# Patient Record
Sex: Male | Born: 1975 | Race: Black or African American | Hispanic: No | Smoking: Never smoker
Health system: Southern US, Community
[De-identification: ages and names within clinical notes are randomized; demographics above are authoritative.]

## PROBLEM LIST (undated history)

## (undated) DIAGNOSIS — K219 Gastro-esophageal reflux disease without esophagitis: Secondary | ICD-10-CM

## (undated) DIAGNOSIS — G4733 Obstructive sleep apnea (adult) (pediatric): Secondary | ICD-10-CM

## (undated) DIAGNOSIS — Z9989 Dependence on other enabling machines and devices: Secondary | ICD-10-CM

## (undated) DIAGNOSIS — M1990 Unspecified osteoarthritis, unspecified site: Secondary | ICD-10-CM

## (undated) DIAGNOSIS — A048 Other specified bacterial intestinal infections: Secondary | ICD-10-CM

## (undated) DIAGNOSIS — G4730 Sleep apnea, unspecified: Secondary | ICD-10-CM

## (undated) DIAGNOSIS — B009 Herpesviral infection, unspecified: Secondary | ICD-10-CM

## (undated) DIAGNOSIS — U071 COVID-19: Secondary | ICD-10-CM

## (undated) HISTORY — DX: Dependence on other enabling machines and devices: Z99.89

## (undated) HISTORY — DX: COVID-19: U07.1

## (undated) HISTORY — DX: Herpesviral infection, unspecified: B00.9

## (undated) HISTORY — DX: Unspecified osteoarthritis, unspecified site: M19.90

## (undated) HISTORY — DX: Other specified bacterial intestinal infections: A04.8

## (undated) HISTORY — DX: Obstructive sleep apnea (adult) (pediatric): G47.33

## (undated) HISTORY — PX: KIDNEY DONATION: SHX685

## (undated) HISTORY — PX: VASECTOMY: SHX75

## (undated) HISTORY — PX: OTHER SURGICAL HISTORY: SHX169

---

## 2017-05-04 ENCOUNTER — Ambulatory Visit: Payer: Self-pay | Admitting: Family Medicine

## 2017-05-12 ENCOUNTER — Telehealth: Payer: Self-pay | Admitting: Family Medicine

## 2017-05-12 NOTE — Telephone Encounter (Signed)
Please advise where to schedule     Copied from CRM #[id number]. Topic: Appointment Scheduling - Scheduling Inquiry for Clinic >> May 05, 2017 11:43 AM Oneal GroutSebastian, Jennifer S wrote: Reason for CRM: New patient appt was scheduled on 05/04/17(Dr Sonnen[place]), cancelled due to weather. Patient needs to get in asap, please advise >> May 05, 2017  2:51 PM Chrismon, Margaretmary Dysshley L wrote: Lm on vm to resched  Pec can sched  >> May 12, 2017 11:31 AM Floria RavelingStovall, Shana A wrote: Pt called in back in to resch new pt appt Best number [phone] .

## 2017-05-19 NOTE — Telephone Encounter (Addendum)
Scheduled patient with Dr. Shirlee LatchMclean if he chooses to have a male as his provider .

## 2017-06-17 ENCOUNTER — Ambulatory Visit: Payer: BC Managed Care – PPO | Admitting: Internal Medicine

## 2017-06-17 ENCOUNTER — Encounter: Payer: Self-pay | Admitting: Internal Medicine

## 2017-06-17 ENCOUNTER — Ambulatory Visit (INDEPENDENT_AMBULATORY_CARE_PROVIDER_SITE_OTHER): Payer: BC Managed Care – PPO

## 2017-06-17 VITALS — BP 130/90 | HR 90 | Temp 98.5°F | Resp 16 | Ht 72.0 in | Wt 280.2 lb

## 2017-06-17 DIAGNOSIS — Z1329 Encounter for screening for other suspected endocrine disorder: Secondary | ICD-10-CM | POA: Diagnosis not present

## 2017-06-17 DIAGNOSIS — R42 Dizziness and giddiness: Secondary | ICD-10-CM

## 2017-06-17 DIAGNOSIS — Z23 Encounter for immunization: Secondary | ICD-10-CM

## 2017-06-17 DIAGNOSIS — R05 Cough: Secondary | ICD-10-CM

## 2017-06-17 DIAGNOSIS — Z0000 Encounter for general adult medical examination without abnormal findings: Secondary | ICD-10-CM

## 2017-06-17 DIAGNOSIS — Z13220 Encounter for screening for lipoid disorders: Secondary | ICD-10-CM | POA: Diagnosis not present

## 2017-06-17 DIAGNOSIS — G4733 Obstructive sleep apnea (adult) (pediatric): Secondary | ICD-10-CM | POA: Diagnosis not present

## 2017-06-17 DIAGNOSIS — G8929 Other chronic pain: Secondary | ICD-10-CM

## 2017-06-17 DIAGNOSIS — R0681 Apnea, not elsewhere classified: Secondary | ICD-10-CM | POA: Diagnosis not present

## 2017-06-17 DIAGNOSIS — K439 Ventral hernia without obstruction or gangrene: Secondary | ICD-10-CM | POA: Diagnosis not present

## 2017-06-17 DIAGNOSIS — M25561 Pain in right knee: Secondary | ICD-10-CM

## 2017-06-17 DIAGNOSIS — R059 Cough, unspecified: Secondary | ICD-10-CM

## 2017-06-17 DIAGNOSIS — K219 Gastro-esophageal reflux disease without esophagitis: Secondary | ICD-10-CM | POA: Diagnosis not present

## 2017-06-17 LAB — CBC WITH DIFFERENTIAL/PLATELET
Basophils Absolute: 0 10*3/uL (ref 0.0–0.1)
Basophils Relative: 0.8 % (ref 0.0–3.0)
EOS PCT: 7.7 % — AB (ref 0.0–5.0)
Eosinophils Absolute: 0.5 10*3/uL (ref 0.0–0.7)
HEMATOCRIT: 46.7 % (ref 39.0–52.0)
HEMOGLOBIN: 15.1 g/dL (ref 13.0–17.0)
LYMPHS PCT: 39.1 % (ref 12.0–46.0)
Lymphs Abs: 2.3 10*3/uL (ref 0.7–4.0)
MCHC: 32.2 g/dL (ref 30.0–36.0)
MCV: 84.2 fl (ref 78.0–100.0)
MONOS PCT: 11.8 % (ref 3.0–12.0)
Monocytes Absolute: 0.7 10*3/uL (ref 0.1–1.0)
Neutro Abs: 2.4 10*3/uL (ref 1.4–7.7)
Neutrophils Relative %: 40.6 % — ABNORMAL LOW (ref 43.0–77.0)
Platelets: 221 10*3/uL (ref 150.0–400.0)
RBC: 5.55 Mil/uL (ref 4.22–5.81)
RDW: 14.8 % (ref 11.5–15.5)
WBC: 5.9 10*3/uL (ref 4.0–10.5)

## 2017-06-17 LAB — COMPREHENSIVE METABOLIC PANEL
ALBUMIN: 4.2 g/dL (ref 3.5–5.2)
ALK PHOS: 74 U/L (ref 39–117)
ALT: 35 U/L (ref 0–53)
AST: 25 U/L (ref 0–37)
BUN: 16 mg/dL (ref 6–23)
CALCIUM: 9.6 mg/dL (ref 8.4–10.5)
CHLORIDE: 102 meq/L (ref 96–112)
CO2: 31 mEq/L (ref 19–32)
Creatinine, Ser: 1.31 mg/dL (ref 0.40–1.50)
GFR: 63.4 mL/min (ref >60.00)
Glucose, Bld: 95 mg/dL (ref 70–99)
POTASSIUM: 4.3 meq/L (ref 3.5–5.1)
SODIUM: 139 meq/L (ref 135–145)
Total Bilirubin: 0.6 mg/dL (ref 0.2–1.2)
Total Protein: 7.5 g/dL (ref 6.0–8.3)

## 2017-06-17 LAB — URINALYSIS, ROUTINE W REFLEX MICROSCOPIC
BILIRUBIN URINE: NEGATIVE
HGB URINE DIPSTICK: NEGATIVE
Ketones, ur: NEGATIVE
LEUKOCYTES UA: NEGATIVE
Nitrite: NEGATIVE
RBC / HPF: NONE SEEN (ref 0–?)
Specific Gravity, Urine: 1.015 (ref 1.000–1.030)
TOTAL PROTEIN, URINE-UPE24: NEGATIVE
URINE GLUCOSE: NEGATIVE
UROBILINOGEN UA: 0.2 (ref 0.0–1.0)
WBC, UA: NONE SEEN (ref 0–?)
pH: 7.5 (ref 5.0–8.0)

## 2017-06-17 LAB — T4, FREE: Free T4: 0.99 ng/dL (ref 0.60–1.60)

## 2017-06-17 LAB — TSH: TSH: 2.16 u[IU]/mL (ref 0.35–4.50)

## 2017-06-17 LAB — LIPID PANEL
CHOLESTEROL: 210 mg/dL — AB (ref 0–200)
HDL: 35.9 mg/dL — ABNORMAL LOW (ref >39.00)
LDL Cholesterol: 148 mg/dL — ABNORMAL HIGH (ref 0–99)
NONHDL: 174.49
Total CHOL/HDL Ratio: 6
Triglycerides: 132 mg/dL (ref 0.0–149.0)
VLDL: 26.4 mg/dL (ref 0.0–40.0)

## 2017-06-17 MED ORDER — PANTOPRAZOLE SODIUM 40 MG PO TBEC: 40.0000 mg | DELAYED_RELEASE_TABLET | Freq: Every evening | ORAL | 1 refills | Status: DC

## 2017-06-17 NOTE — Patient Instructions (Addendum)
rec Tdap vaccine if it has been more than 10 years for whooping cough and dirty cut/animal bite protection  rec hepatitis B vaccines 0 months, 2 months, 6 months  You can get both here in the future  Try Protonix for GERD before dinner 30 minutes    Sleep Apnea Sleep apnea is a condition in which breathing pauses or becomes shallow during sleep. Episodes of sleep apnea usually last 10 seconds or longer, and they may occur as many as 20 times an hour. Sleep apnea disrupts your sleep and keeps your body from getting the rest that it needs. This condition can increase your risk of certain health problems, including:  Heart attack.  Stroke.  Obesity.  Diabetes.  Heart failure.  Irregular heartbeat.  There are three kinds of sleep apnea:  Obstructive sleep apnea. This kind is caused by a blocked or collapsed airway.  Central sleep apnea. This kind happens when the part of the brain that controls breathing does not send the correct signals to the muscles that control breathing.  Mixed sleep apnea. This is a combination of obstructive and central sleep apnea.  What are the causes? The most common cause of this condition is a collapsed or blocked airway. An airway can collapse or become blocked if:  Your throat muscles are abnormally relaxed.  Your tongue and tonsils are larger than normal.  You are overweight.  Your airway is smaller than normal.  What increases the risk? This condition is more likely to develop in people who:  Are overweight.  Smoke.  Have a smaller than normal airway.  Are elderly.  Are male.  Drink alcohol.  Take sedatives or tranquilizers.  Have a family history of sleep apnea.  What are the signs or symptoms? Symptoms of this condition include:  Trouble staying asleep.  Daytime sleepiness and tiredness.  Irritability.  Loud snoring.  Morning headaches.  Trouble concentrating.  Forgetfulness.  Decreased interest in  sex.  Unexplained sleepiness.  Mood swings.  Personality changes.  Feelings of depression.  Waking up often during the night to urinate.  Dry mouth.  Sore throat.  How is this diagnosed? This condition may be diagnosed with:  A medical history.  A physical exam.  A series of tests that are done while you are sleeping (sleep study). These tests are usually done in a sleep lab, but they may also be done at home.  How is this treated? Treatment for this condition aims to restore normal breathing and to ease symptoms during sleep. It may involve managing health issues that can affect breathing, such as high blood pressure or obesity. Treatment may include:  Sleeping on your side.  Using a decongestant if you have nasal congestion.  Avoiding the use of depressants, including alcohol, sedatives, and narcotics.  Losing weight if you are overweight.  Making changes to your diet.  Quitting smoking.  Using a device to open your airway while you sleep, such as: ? An oral appliance. This is a custom-made mouthpiece that shifts your lower jaw forward. ? A continuous positive airway pressure (CPAP) device. This device delivers oxygen to your airway through a mask. ? A nasal expiratory positive airway pressure (EPAP) device. This device has valves that you put into each nostril. ? A bi-level positive airway pressure (BPAP) device. This device delivers oxygen to your airway through a mask.  Surgery if other treatments do not work. During surgery, excess tissue is removed to create a wider airway.  It is  important to get treatment for sleep apnea. Without treatment, this condition can lead to:  High blood pressure.  Coronary artery disease.  (Men) An inability to achieve or maintain an erection (impotence).  Reduced thinking abilities.  Follow these instructions at home:  Make any lifestyle changes that your health care provider recommends.  Eat a healthy, well-balanced  diet.  Take over-the-counter and prescription medicines only as told by your health care provider.  Avoid using depressants, including alcohol, sedatives, and narcotics.  Take steps to lose weight if you are overweight.  If you were given a device to open your airway while you sleep, use it only as told by your health care provider.  Do not use any tobacco products, such as cigarettes, chewing tobacco, and e-cigarettes. If you need help quitting, ask your health care provider.  Keep all follow-up visits as told by your health care provider. This is important. Contact a health care provider if:  The device that you received to open your airway during sleep is uncomfortable or does not seem to be working.  Your symptoms do not improve.  Your symptoms get worse. Get help right away if:  You develop chest pain.  You develop shortness of breath.  You develop discomfort in your back, arms, or stomach.  You have trouble speaking.  You have weakness on one side of your body.  You have drooping in your face. These symptoms may represent a serious problem that is an emergency. Do not wait to see if the symptoms will go away. Get medical help right away. Call your local emergency services (911 in the [place] Do not drive yourself to the hospital. This information is not intended to replace advice given to you by your health care provider. Make sure you discuss any questions you have with your health care provider. Document Released: 05/01/2002 Document Revised: 01/05/2016 Document Reviewed: 02/18/2015 Elsevier Interactive Patient Education  2018 [employer]  Gastroesophageal Reflux Disease, Adult Normally, food travels down the esophagus and stays in the stomach to be digested. However, when a person has gastroesophageal reflux disease (GERD), food and stomach acid move back up into the esophagus. When this happens, the esophagus becomes sore and inflamed. Over time, GERD can create  small holes (ulcers) in the lining of the esophagus. What are the causes? This condition is caused by a problem with the muscle between the esophagus and the stomach (lower esophageal sphincter, or LES). Normally, the LES muscle closes after food passes through the esophagus to the stomach. When the LES is weakened or abnormal, it does not close properly, and that allows food and stomach acid to go back up into the esophagus. The LES can be weakened by certain dietary substances, medicines, and medical conditions, including:  Tobacco use.  Pregnancy.  Having a hiatal hernia.  Heavy alcohol use.  Certain foods and beverages, such as coffee, chocolate, onions, and peppermint.  What increases the risk? This condition is more likely to develop in:  People who have an increased body weight.  People who have connective tissue disorders.  People who use NSAID medicines.  What are the signs or symptoms? Symptoms of this condition include:  Heartburn.  Difficult or painful swallowing.  The feeling of having a lump in the throat.  Abitter taste in the mouth.  Bad breath.  Having a large amount of saliva.  Having an upset or bloated stomach.  Belching.  Chest pain.  Shortness of breath or wheezing.  Ongoing (chronic) cough  or a night-time cough.  Wearing away of tooth enamel.  Weight loss.  Different conditions can cause chest pain. Make sure to see your health care provider if you experience chest pain. How is this diagnosed? Your health care provider will take a medical history and perform a physical exam. To determine if you have mild or severe GERD, your health care provider may also monitor how you respond to treatment. You may also have other tests, including:  An endoscopy toexamine your stomach and esophagus with a small camera.  A test thatmeasures the acidity level in your esophagus.  A test thatmeasures how much pressure is on your esophagus.  A  barium swallow or modified barium swallow to show the shape, size, and functioning of your esophagus.  How is this treated? The goal of treatment is to help relieve your symptoms and to prevent complications. Treatment for this condition may vary depending on how severe your symptoms are. Your health care provider may recommend:  Changes to your diet.  Medicine.  Surgery.  Follow these instructions at home: Diet  Follow a diet as recommended by your health care provider. This may involve avoiding foods and drinks such as: ? Coffee and tea (with or without caffeine). ? Drinks that containalcohol. ? Energy drinks and sports drinks. ? Carbonated drinks or sodas. ? Chocolate and cocoa. ? Peppermint and mint flavorings. ? Garlic and onions. ? Horseradish. ? Spicy and acidic foods, including peppers, chili powder, curry powder, vinegar, hot sauces, and barbecue sauce. ? Citrus fruit juices and citrus fruits, such as oranges, lemons, and limes. ? Tomato-based foods, such as red sauce, chili, salsa, and pizza with red sauce. ? Fried and fatty foods, such as donuts, french fries, potato chips, and high-fat dressings. ? High-fat meats, such as hot dogs and fatty cuts of red and white meats, such as rib eye steak, sausage, ham, and bacon. ? High-fat dairy items, such as whole milk, butter, and cream cheese.  Eat small, frequent meals instead of large meals.  Avoid drinking large amounts of liquid with your meals.  Avoid eating meals during the 2-3 hours before bedtime.  Avoid lying down right after you eat.  Do not exercise right after you eat. General instructions  Pay attention to any changes in your symptoms.  Take over-the-counter and prescription medicines only as told by your health care provider. Do not take aspirin, ibuprofen, or other NSAIDs unless your health care provider told you to do so.  Do not use any tobacco products, including cigarettes, chewing tobacco, and  e-cigarettes. If you need help quitting, ask your health care provider.  Wear loose-fitting clothing. Do not wear anything tight around your waist that causes pressure on your abdomen.  Raise (elevate) the head of your bed 6 inches (15cm).  Try to reduce your stress, such as with yoga or meditation. If you need help reducing stress, ask your health care provider.  If you are overweight, reduce your weight to an amount that is healthy for you. Ask your health care provider for guidance about a safe weight loss goal.  Keep all follow-up visits as told by your health care provider. This is important. Contact a health care provider if:  You have new symptoms.  You have unexplained weight loss.  You have difficulty swallowing, or it hurts to swallow.  You have wheezing or a persistent cough.  Your symptoms do not improve with treatment.  You have a hoarse voice. Get help right away if:  You have pain in your arms, neck, jaw, teeth, or back.  You feel sweaty, dizzy, or light-headed.  You have chest pain or shortness of breath.  You vomit and your vomit looks like blood or coffee grounds.  You faint.  Your stool is bloody or black.  You cannot swallow, drink, or eat. This information is not intended to replace advice given to you by your health care provider. Make sure you discuss any questions you have with your health care provider. Document Released: 02/18/2005 Document Revised: 10/09/2015 Document Reviewed: 09/05/2014 Elsevier Interactive Patient Education  2018 [employer].   Food Choices for Gastroesophageal Reflux Disease, Adult When you have gastroesophageal reflux disease (GERD), the foods you eat and your eating habits are very important. Choosing the right foods can help ease the discomfort of GERD. Consider working with a diet and nutrition specialist (dietitian) to help you make healthy food choices. What general guidelines should I follow? Eating plan  Choose  healthy foods low in fat, such as fruits, vegetables, whole grains, low-fat dairy products, and lean meat, fish, and poultry.  Eat frequent, small meals instead of three large meals each day. Eat your meals slowly, in a relaxed setting. Avoid bending over or lying down until 2-3 hours after eating.  Limit high-fat foods such as fatty meats or fried foods.  Limit your intake of oils, butter, and shortening to less than 8 teaspoons each day.  Avoid the following: ? Foods that cause symptoms. These may be different for different people. Keep a food diary to keep track of foods that cause symptoms. ? Alcohol. ? Drinking large amounts of liquid with meals. ? Eating meals during the 2-3 hours before bed.  Cook foods using methods other than frying. This may include baking, grilling, or broiling. Lifestyle   Maintain a healthy weight. Ask your health care provider what weight is healthy for you. If you need to lose weight, work with your health care provider to do so safely.  Exercise for at least 30 minutes on 5 or more days each week, or as told by your health care provider.  Avoid wearing clothes that fit tightly around your waist and chest.  Do not use any products that contain nicotine or tobacco, such as cigarettes and e-cigarettes. If you need help quitting, ask your health care provider.  Sleep with the head of your bed raised. Use a wedge under the mattress or blocks under the bed frame to raise the head of the bed. What foods are not recommended? The items listed may not be a complete list. Talk with your dietitian about what dietary choices are best for you. Grains Pastries or quick breads with added fat. [place] toast. Vegetables Deep fried vegetables. [place] fries. Any vegetables prepared with added fat. Any vegetables that cause symptoms. For some people this may include tomatoes and tomato products, chili peppers, onions and garlic, and horseradish. Fruits Any fruits prepared  with added fat. Any fruits that cause symptoms. For some people this may include citrus fruits, such as oranges, grapefruit, pineapple, and lemons. Meats and other protein foods High-fat meats, such as fatty beef or pork, hot dogs, ribs, ham, sausage, salami and bacon. Fried meat or protein, including fried fish and fried chicken. Nuts and nut butters. Dairy Whole milk and chocolate milk. Sour cream. Cream. Ice cream. Cream cheese. Milk shakes. Beverages Coffee and tea, with or without caffeine. Carbonated beverages. Sodas. Energy drinks. Fruit juice made with acidic fruits (such as orange or  grapefruit). Tomato juice. Alcoholic drinks. Fats and oils Butter. Margarine. Shortening. Ghee. Sweets and desserts Chocolate and cocoa. Donuts. Seasoning and other foods Pepper. Peppermint and spearmint. Any condiments, herbs, or seasonings that cause symptoms. For some people, this may include curry, hot sauce, or vinegar-based salad dressings. Summary  When you have gastroesophageal reflux disease (GERD), food and lifestyle choices are very important to help ease the discomfort of GERD.  Eat frequent, small meals instead of three large meals each day. Eat your meals slowly, in a relaxed setting. Avoid bending over or lying down until 2-3 hours after eating.  Limit high-fat foods such as fatty meat or fried foods. This information is not intended to replace advice given to you by your health care provider. Make sure you discuss any questions you have with your health care provider. Document Released: 05/11/2005 Document Revised: 05/12/2016 Document Reviewed: 05/12/2016 Elsevier Interactive Patient Education  [employer]2018 [employer]

## 2017-06-18 ENCOUNTER — Encounter: Payer: Self-pay | Admitting: *Deleted

## 2017-06-20 ENCOUNTER — Other Ambulatory Visit: Payer: Self-pay | Admitting: Internal Medicine

## 2017-06-20 ENCOUNTER — Encounter: Payer: Self-pay | Admitting: Internal Medicine

## 2017-06-20 DIAGNOSIS — R059 Cough, unspecified: Secondary | ICD-10-CM | POA: Insufficient documentation

## 2017-06-20 DIAGNOSIS — A048 Other specified bacterial intestinal infections: Secondary | ICD-10-CM

## 2017-06-20 DIAGNOSIS — K429 Umbilical hernia without obstruction or gangrene: Secondary | ICD-10-CM | POA: Insufficient documentation

## 2017-06-20 DIAGNOSIS — R42 Dizziness and giddiness: Secondary | ICD-10-CM | POA: Insufficient documentation

## 2017-06-20 DIAGNOSIS — E785 Hyperlipidemia, unspecified: Secondary | ICD-10-CM | POA: Insufficient documentation

## 2017-06-20 DIAGNOSIS — E669 Obesity, unspecified: Secondary | ICD-10-CM | POA: Insufficient documentation

## 2017-06-20 DIAGNOSIS — G8929 Other chronic pain: Secondary | ICD-10-CM | POA: Insufficient documentation

## 2017-06-20 DIAGNOSIS — R05 Cough: Secondary | ICD-10-CM | POA: Insufficient documentation

## 2017-06-20 DIAGNOSIS — M25561 Pain in right knee: Secondary | ICD-10-CM

## 2017-06-20 MED ORDER — AMOXICILLIN 500 MG PO CAPS: 1000.0000 mg | ORAL_CAPSULE | Freq: Two times a day (BID) | ORAL | 0 refills | Status: DC

## 2017-06-20 MED ORDER — CLARITHROMYCIN 500 MG PO TABS: 500.0000 mg | ORAL_TABLET | Freq: Two times a day (BID) | ORAL | 0 refills | Status: DC

## 2017-06-20 NOTE — Progress Notes (Signed)
Chief Complaint  Patient presents with  . Establish Care   Establish care 1. 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 He is unsure if he has allergies, +post nasal drip, denies choking on liquids/foods. He does also report uncontrolled GERD sxs worse at night  2. He does report dizziness at times with kneeling to standing positions and he will try to get up slowly to help he does report water intake id low  3. He c/o bulge in abdomen w/o pain and has been told before it was a hernia 4. He does note his wife complaints of him snoring at night but he is unsure if he stops breathing  5. He reports chronic right knee pain s/p meniscal tear repair right knee Dr. Rosita Kea    Review of Systems  Constitutional: Negative for weight loss.  HENT: Negative for hearing loss.   Eyes:       No vision changes   Respiratory: Negative for shortness of breath.   Cardiovascular: Negative for chest pain.  Gastrointestinal: Positive for heartburn.  Musculoskeletal: Positive for joint pain.  Skin: Negative for rash.  Neurological: Positive for dizziness. Negative for headaches.  Psychiatric/Behavioral: Negative for memory loss.   Past Medical History:  Diagnosis Date  . Arthritis    knee s/p right knee surgery meniscal tear with bone fragments, ankle (ortho Dr. Rosita Kea)   Past Surgical History:  Procedure Laterality Date  . KIDNEY DONATION     2004  . meniscal tear     right knee Dr. Rosita Kea 2014    Family History  Problem Relation Age of Onset  . Drug abuse Mother   . Hyperlipidemia Mother   . Hypertension Mother   . Cancer Maternal Aunt        brain, breast, lung ? primary was smoker    Social History   Socioeconomic History  . Marital status: Married    Spouse name: Not on file  . Number of children: Not on file  . Years of education: Not on file  . Highest education level: Not  on file  Social Needs  . Financial resource strain: Not on file  . Food insecurity - worry: Not on file  . Food insecurity - inability: Not on file  . Transportation needs - medical: Not on file  . Transportation needs - non-medical: Not on file  Occupational History  . Not on file  Tobacco Use  . Smoking status: Never Smoker  . Smokeless tobacco: Never Used  . Tobacco comment: light in the past social smoker quit  Substance and Sexual Activity  . Alcohol use: Yes  . Drug use: No  . Sexual activity: Yes  Other Topics Concern  . Not on file  Social History Narrative   2 year college    Works [employer], coaches girls basketball, football, baseball at [employer]   No outpatient medications have been marked as taking for the 06/17/17 encounter (Office Visit) with McLean-Scocuzza, Pasty Spillers, MD.   No Known Allergies Recent Results (from the past 2160 hour(s))  Comprehensive metabolic panel     Status: None   Collection Time: 06/17/17 10:43 AM  Result Value Ref Range   Sodium 139 135 - 145 mEq/L   Potassium 4.3 3.5 - 5.1 mEq/L   Chloride 102 96 - 112 mEq/L   CO2 31 19 -  32 mEq/L   Glucose, Bld 95 70 - 99 mg/dL   BUN 16 6 - 23 mg/dL   Creatinine, Ser 4.09 0.40 - 1.50 mg/dL   Total Bilirubin 0.6 0.2 - 1.2 mg/dL   Alkaline Phosphatase 74 39 - 117 U/L   AST 25 0 - 37 U/L   ALT 35 0 - 53 U/L   Total Protein 7.5 6.0 - 8.3 g/dL   Albumin 4.2 3.5 - 5.2 g/dL   Calcium 9.6 8.4 - 81.1 mg/dL   GFR 91.47 >82.95 mL/min  CBC with Differential/Platelet     Status: Abnormal   Collection Time: 06/17/17 10:43 AM  Result Value Ref Range   WBC 5.9 4.0 - 10.5 K/uL   RBC 5.55 4.22 - 5.81 Mil/uL   Hemoglobin 15.1 13.0 - 17.0 g/dL   HCT 62.1 30.8 - 65.7 %   MCV 84.2 78.0 - 100.0 fl   MCHC 32.2 30.0 - 36.0 g/dL   RDW 84.6 96.2 - 95.2 %   Platelets 221.0 150.0 - 400.0 K/uL   Neutrophils Relative % 40.6 (L) 43.0 - 77.0 %   Lymphocytes Relative 39.1 12.0 - 46.0 %    Monocytes Relative 11.8 3.0 - 12.0 %   Eosinophils Relative 7.7 (H) 0.0 - 5.0 %   Basophils Relative 0.8 0.0 - 3.0 %   Neutro Abs 2.4 1.4 - 7.7 K/uL   Lymphs Abs 2.3 0.7 - 4.0 K/uL   Monocytes Absolute 0.7 0.1 - 1.0 K/uL   Eosinophils Absolute 0.5 0.0 - 0.7 K/uL   Basophils Absolute 0.0 0.0 - 0.1 K/uL  T4, free     Status: None   Collection Time: 06/17/17 10:43 AM  Result Value Ref Range   Free T4 0.99 0.60 - 1.60 ng/dL    Comment: Specimens from patients who are undergoing biotin therapy and /or ingesting biotin supplements may contain high levels of biotin.  The higher biotin concentration in these specimens interferes with this Free T4 assay.  Specimens that contain high levels  of biotin may cause false high results for this Free T4 assay.  Please interpret results in light of the total clinical presentation of the patient.    TSH     Status: None   Collection Time: 06/17/17 10:43 AM  Result Value Ref Range   TSH 2.16 0.35 - 4.50 uIU/mL  Urinalysis, Routine w reflex microscopic     Status: None   Collection Time: 06/17/17 10:43 AM  Result Value Ref Range   Color, Urine YELLOW Yellow;Lt. Yellow   APPearance CLEAR Clear   Specific Gravity, Urine 1.015 1.000 - 1.030   pH 7.5 5.0 - 8.0   Total Protein, Urine NEGATIVE Negative   Urine Glucose NEGATIVE Negative   Ketones, ur NEGATIVE Negative   Bilirubin Urine NEGATIVE Negative   Hgb urine dipstick NEGATIVE Negative   Urobilinogen, UA 0.2 0.0 - 1.0   Leukocytes, UA NEGATIVE Negative   Nitrite NEGATIVE Negative   WBC, UA none seen 0-2/hpf   RBC / HPF none seen 0-2/hpf  Lipid panel     Status: Abnormal   Collection Time: 06/17/17 10:43 AM  Result Value Ref Range   Cholesterol 210 (H) 0 - 200 mg/dL    Comment: ATP III Classification       Desirable:  < 200 mg/dL               Borderline High:  200 - 239 mg/dL          High:  > =  240 mg/dL   Triglycerides 621.3132.0 0.0 - 149.0 mg/dL    Comment: Normal:  <086<150 mg/dLBorderline High:  150  - 199 mg/dL   HDL 57.8435.90 (L) >69.62>39.00 mg/dL   VLDL 95.226.4 0.0 - 84.140.0 mg/dL   LDL Cholesterol 324148 (H) 0 - 99 mg/dL   Total CHOL/HDL Ratio 6     Comment:                Men          Women1/2 Average Risk     3.4          3.3Average Risk          5.0          4.42X Average Risk          9.6          7.13X Average Risk          15.0          11.0                       NonHDL 174.49     Comment: NOTE:  Non-HDL goal should be 30 mg/dL higher than patient's LDL goal (i.e. LDL goal of < 70 mg/dL, would have non-HDL goal of < 100 mg/dL)  H Pylori, IGM, IGG, IGA AB     Status: Abnormal (Preliminary result)   Collection Time: 06/17/17 10:43 AM  Result Value Ref Range   H. pylori, IgG AbS WILL FOLLOW    H. pylori, IgA Abs 14.4 (H) 0.0 - 8.9 units    Comment:                                 Negative          <9.0                                 Equivocal   9.0 - 11.0                                 Positive         >11.0    H pylori, IgM Abs 11.8 (H) 0.0 - 8.9 units    Comment:                                 Negative          <9.0                                 Equivocal   9.0 - 11.0                                 Positive         >11.0 This test was developed and its performance characteristics determined by [facility]. It has not been cleared or approved by the Food and Drug Administration.    Objective  Body mass index is 38.01 kg/m. Wt Readings from Last 3 Encounters:  06/17/17 280 lb 4 oz (127.1 kg)   Temp Readings from Last 3 Encounters:  06/17/17  98.5 F (36.9 C) (Oral)   BP Readings from Last 3 Encounters:  06/17/17 130/90   Pulse Readings from Last 3 Encounters:  06/17/17 90   O2 sat room air 96%  Physical Exam  Constitutional: He is oriented to person, place, and time and well-developed, well-nourished, and in no distress. Vital signs are normal.  HENT:  Head: Normocephalic and atraumatic.  Eyes: Conjunctivae are normal. Pupils are equal, round, and reactive to light.    Cardiovascular: Normal rate, regular rhythm and normal heart sounds.  Pulmonary/Chest: Effort normal and breath sounds normal.  Abdominal: Soft. Bowel sounds are normal. There is no tenderness.    Neurological: He is alert and oriented to person, place, and time. Gait normal. Gait normal.  Skin: Skin is warm and dry.  Psychiatric: Mood, memory, affect and judgment normal.  Nursing note and vitals reviewed.   Assessment   1. Cough ? Lung vs GERD vs postnasal drip related  2. Dizziness with borderline BP today 130/90  3. Abdominal hernia vs diasthesis recti to abdomen  4. Ho snoring with possibly apneic episodes  5. Chronic right knee pain s/p meniscal tear repair Dr. Rosita Kea  6. HM Plan  1. Trial of protonix 40 mg before dinner  If this does not help consider antihistamine, nasal spray to tx PND CXR today  Check H pylori 2. Increase water intake, monitor BP  3. Monitor w/o sx's currently  4. Order sleep study   5. F/u ortho Dr. Rosita Kea  6. Given flu shot today  UTD Tdap  Hep B status not immune according to labs 09/04/16 titer <3.1. rec schedule hep B series of vaccines in future  Check CMET, CBC, UA, lipid, TSH, T4,   hcv neg, hiv neg 09/04/16 he reports he had this testing due to student in high school walking around with thumb tack and high fiving people    Provider: Dr. French Ana McLean-Scocuzza-Internal Medicine

## 2017-06-21 ENCOUNTER — Telehealth: Payer: Self-pay | Admitting: Internal Medicine

## 2017-06-21 LAB — H PYLORI, IGM, IGG, IGA AB
H pylori, IgM Abs: 11.8 units — ABNORMAL HIGH (ref 0.0–8.9)
H. pylori, IgA Abs: 14.4 units — ABNORMAL HIGH (ref 0.0–8.9)
H. pylori, IgG AbS: <0.8 Index Value (ref 0.00–0.79)

## 2017-06-21 NOTE — Telephone Encounter (Signed)
Pt returning call asking for a call back regarding why he was prescribed antibiotics.

## 2017-06-21 NOTE — Telephone Encounter (Signed)
Pt called questioning why he was placed on antibiotics. Pos.  for H. Pylori but [facility] does not have permission to give results. Please call to discuss: [phone]

## 2017-06-21 NOTE — Telephone Encounter (Signed)
Please advise 

## 2017-06-21 NOTE — Telephone Encounter (Signed)
Copied from CRM [id number]. Topic: General - Other >> Jun 21, 2017  6:00 PM Raquel SarnaHayes, Teresa G wrote: Pt doesn't know why he was prescribed antibiotics.  Please call pt back asap.

## 2017-06-22 ENCOUNTER — Ambulatory Visit: Payer: BC Managed Care – PPO

## 2017-06-22 NOTE — Telephone Encounter (Signed)
Patient advised of result and verbalized an understanding.  

## 2017-06-22 NOTE — Telephone Encounter (Signed)
Please advise 

## 2017-06-23 ENCOUNTER — Ambulatory Visit (INDEPENDENT_AMBULATORY_CARE_PROVIDER_SITE_OTHER): Payer: BC Managed Care – PPO | Admitting: *Deleted

## 2017-06-23 DIAGNOSIS — Z23 Encounter for immunization: Secondary | ICD-10-CM | POA: Diagnosis not present

## 2017-06-25 DIAGNOSIS — G4730 Sleep apnea, unspecified: Secondary | ICD-10-CM

## 2017-06-25 HISTORY — DX: Sleep apnea, unspecified: G47.30

## 2017-07-05 ENCOUNTER — Telehealth: Payer: Self-pay | Admitting: Internal Medicine

## 2017-07-05 NOTE — Telephone Encounter (Signed)
Please advise 

## 2017-07-05 NOTE — Telephone Encounter (Signed)
Copied from CRM [id number]. Topic: General - Other >> Jul 05, 2017 11:14 AM Cecelia ByarsGreen, Desire Fulp L, RMA wrote: Reason for CRM: Patient is requesting a call back from Dr. Judie GrieveMcLean-Scocuzza or CMA concerning sleep study results

## 2017-07-06 ENCOUNTER — Other Ambulatory Visit: Payer: Self-pay | Admitting: Internal Medicine

## 2017-07-06 NOTE — Progress Notes (Signed)
Sleep study +severe sleep apnea  Does he want me to order CPAP?  rec from sleep study and CPAP titration 06/30/17  Nasal cpap 13 cm h20, fisher paykel simplus mask large chin  Stray not used humidication used and heated   They are rec he wear nightly   TMS

## 2017-07-07 NOTE — Telephone Encounter (Signed)
Patient has been notified of results of sleep study. Patient was informed that we will be ordering a CPAP machine for him.

## 2017-07-08 ENCOUNTER — Other Ambulatory Visit: Payer: Self-pay

## 2017-07-08 ENCOUNTER — Encounter
Admission: RE | Admit: 2017-07-08 | Discharge: 2017-07-08 | Disposition: A | Discharge status: Home / Self Care | Payer: BC Managed Care – PPO | Source: Ambulatory Visit | Attending: Orthopedic Surgery | Admitting: Orthopedic Surgery

## 2017-07-08 HISTORY — DX: Gastro-esophageal reflux disease without esophagitis: K21.9

## 2017-07-08 HISTORY — DX: Sleep apnea, unspecified: G47.30

## 2017-07-08 NOTE — Pre-Procedure Instructions (Signed)
Called, No answer, left message.

## 2017-07-08 NOTE — Patient Instructions (Signed)
Your procedure is scheduled on: 07-15-17   Report to Same Day Surgery 2nd floor [facility](Medical Mall Entrance-take elevator on left to 2nd floor.  Check in with surgery information desk.) To find out your arrival time please call [phone] between 1PM - 3PM on 07-14-17  Remember: Instructions that are not followed completely may result in serious medical risk, up to and including death, or upon the discretion of your surgeon and anesthesiologist your surgery may need to be rescheduled.    _x___ 1. Do not eat food after midnight the night before your procedure. NO GUM OR CANDY AFTER MIDNIGHT.  You may drink clear liquids up to 2 hours before you are scheduled to arrive at the hospital for your procedure.  Do not drink clear liquids within 2 hours of your scheduled arrival to the hospital.  Clear liquids include  --Water or Apple juice without pulp  --Clear carbohydrate beverage such as ClearFast or Gatorade  --Black Coffee or Clear Tea (No milk, no creamers, do not add anything to the coffee or Tea     __x__ 2. No Alcohol for 24 hours before or after surgery.   __x__3. No Smoking or e-cigarettes for 24 prior to surgery.  Do not use any chewable tobacco products for at least 6 hour prior to surgery   ____  4. Bring all medications with you on the day of surgery if instructed.    __x__ 5. Notify your doctor if there is any change in your medical condition     (cold, fever, infections).    x___6. On the morning of surgery brush your teeth with toothpaste and water.  You may rinse your mouth with mouth wash if you wish.  Do not swallow any toothpaste or mouthwash.   Do not wear jewelry, make-up, hairpins, clips or nail polish.  Do not wear lotions, powders, or perfumes. You may wear deodorant.  Do not shave 48 hours prior to surgery. Men may shave face and neck.  Do not bring valuables to the hospital.    [facility] is not responsible for any belongings or valuables.      Contacts, dentures or bridgework may not be worn into surgery.  Leave your suitcase in the car. After surgery it may be brought to your room.  For patients admitted to the hospital, discharge time is determined by your treatment team.  _  Patients discharged the day of surgery will not be allowed to drive home.  You will need someone to drive you home and stay with you the night of your procedure.    Please read over the following fact sheets that you were given:   [facility] Preparing [facility] and or MRSA Information   _x___ TAKE THE FOLLOWING MEDICATION THE MORNING OF SURGERY WITH A SMALL SIP OF WATER. These include:  1. PROTONIX  2.  3.  4.  5.  6.  ____Fleets enema or Magnesium Citrate as directed.   _x___ Use CHG Soap or sage wipes as directed on instruction sheet   ____ Use inhalers on the day of surgery and bring to hospital day of surgery  ____ Stop Metformin and Janumet 2 days prior to surgery.    ____ Take 1/2 of usual insulin dose the night before surgery and none on the morning surgery.   ____ Follow recommendations from Cardiologist, Pulmonologist or PCP regarding stopping Aspirin, Coumadin, Plavix ,Eliquis, Effient, or Pradaxa, and Pletal.  X____Stop Anti-inflammatories such as Advil, Aleve, Ibuprofen, Motrin,  Naproxen, Naprosyn, Goodies powders or aspirin products NOW-OK to take Tylenol    ____ Stop supplements until after surgery.     ____ Bring C-Pap to the hospital.

## 2017-07-12 NOTE — Telephone Encounter (Signed)
Can you call pt and ask he call them back for reasons  +OSA and needs cpap  Please give pt the # listed   Thanks tMS

## 2017-07-12 NOTE — Telephone Encounter (Signed)
Copied from CRM [id number]. Topic: General - Other >> Jul 12, 2017  2:52 PM Raquel SarnaHayes, Teresa G wrote: Tessie EkeYuri w/ [facility] [phone]  ext [phone] Couldn't reach the pt [facility]ap [facility] requested on 06/05/17.  Wanted to give status incase pt call to ask.

## 2017-07-12 NOTE — Telephone Encounter (Signed)
FYI

## 2017-07-14 ENCOUNTER — Encounter: Payer: Self-pay | Admitting: *Deleted

## 2017-07-15 ENCOUNTER — Ambulatory Visit: Payer: BC Managed Care – PPO | Admitting: Anesthesiology

## 2017-07-15 ENCOUNTER — Encounter
Admission: RE | Disposition: A | Discharge status: Home / Self Care | Payer: Self-pay | Source: Ambulatory Visit | Attending: Orthopedic Surgery

## 2017-07-15 ENCOUNTER — Ambulatory Visit
Admission: RE | Admit: 2017-07-15 | Discharge: 2017-07-15 | Disposition: A | Discharge status: Home / Self Care | Payer: BC Managed Care – PPO | Source: Ambulatory Visit | Attending: Orthopedic Surgery | Admitting: Orthopedic Surgery

## 2017-07-15 ENCOUNTER — Encounter: Payer: Self-pay | Admitting: Anesthesiology

## 2017-07-15 DIAGNOSIS — X58XXXA Exposure to other specified factors, initial encounter: Secondary | ICD-10-CM | POA: Diagnosis not present

## 2017-07-15 DIAGNOSIS — S83001A Unspecified subluxation of right patella, initial encounter: Secondary | ICD-10-CM | POA: Insufficient documentation

## 2017-07-15 DIAGNOSIS — M2391 Unspecified internal derangement of right knee: Secondary | ICD-10-CM | POA: Insufficient documentation

## 2017-07-15 DIAGNOSIS — M1990 Unspecified osteoarthritis, unspecified site: Secondary | ICD-10-CM | POA: Diagnosis not present

## 2017-07-15 DIAGNOSIS — K219 Gastro-esophageal reflux disease without esophagitis: Secondary | ICD-10-CM | POA: Insufficient documentation

## 2017-07-15 DIAGNOSIS — M65861 Other synovitis and tenosynovitis, right lower leg: Secondary | ICD-10-CM | POA: Insufficient documentation

## 2017-07-15 DIAGNOSIS — S83241A Other tear of medial meniscus, current injury, right knee, initial encounter: Secondary | ICD-10-CM | POA: Diagnosis not present

## 2017-07-15 HISTORY — PX: MENISECTOMY: SHX5181

## 2017-07-15 HISTORY — PX: KNEE ARTHROSCOPY WITH MEDIAL MENISECTOMY: SHX5651

## 2017-07-15 SURGERY — ARTHROSCOPY, KNEE, WITH MEDIAL MENISCECTOMY
Anesthesia: General | Site: Knee | Laterality: Right

## 2017-07-15 MED ORDER — HYDROCODONE-ACETAMINOPHEN 5-325 MG PO TABS: 1.0000 | ORAL_TABLET | Freq: Four times a day (QID) | ORAL | 0 refills | Status: DC | PRN

## 2017-07-15 MED ORDER — MIDAZOLAM HCL 2 MG/2ML IJ SOLN
INTRAMUSCULAR | Status: DC | PRN
  Administered 2017-07-15: 2 mg via INTRAVENOUS

## 2017-07-15 MED ORDER — LIDOCAINE HCL (CARDIAC) 20 MG/ML IV SOLN
INTRAVENOUS | Status: DC | PRN
  Administered 2017-07-15: 100 mg via INTRAVENOUS

## 2017-07-15 MED ORDER — BUPIVACAINE-EPINEPHRINE (PF) 0.5% -1:200000 IJ SOLN
INTRAMUSCULAR | Status: DC | PRN
  Administered 2017-07-15: 30 mL via PERINEURAL

## 2017-07-15 MED ORDER — ONDANSETRON HCL 4 MG/2ML IJ SOLN
INTRAMUSCULAR | Status: DC | PRN
  Administered 2017-07-15: 4 mg via INTRAVENOUS

## 2017-07-15 MED ORDER — FENTANYL CITRATE (PF) 100 MCG/2ML IJ SOLN
INTRAMUSCULAR | Status: AC
  Administered 2017-07-15: 25 ug via INTRAVENOUS
  Filled 2017-07-15: qty 2

## 2017-07-15 MED ORDER — ONDANSETRON HCL 4 MG/2ML IJ SOLN: 4.0000 mg | Freq: Once | INTRAMUSCULAR | Status: DC | PRN

## 2017-07-15 MED ORDER — ONDANSETRON HCL 4 MG/2ML IJ SOLN
INTRAMUSCULAR | Status: AC
  Filled 2017-07-15: qty 2

## 2017-07-15 MED ORDER — LACTATED RINGERS IV SOLN
INTRAVENOUS | Status: DC
  Administered 2017-07-15 (×2): via INTRAVENOUS

## 2017-07-15 MED ORDER — FENTANYL CITRATE (PF) 100 MCG/2ML IJ SOLN
INTRAMUSCULAR | Status: AC
  Filled 2017-07-15: qty 4

## 2017-07-15 MED ORDER — PROPOFOL 10 MG/ML IV BOLUS
INTRAVENOUS | Status: DC | PRN
  Administered 2017-07-15: 200 mg via INTRAVENOUS

## 2017-07-15 MED ORDER — PROPOFOL 10 MG/ML IV BOLUS
INTRAVENOUS | Status: AC
  Filled 2017-07-15: qty 20

## 2017-07-15 MED ORDER — FENTANYL CITRATE (PF) 100 MCG/2ML IJ SOLN
INTRAMUSCULAR | Status: DC | PRN
  Administered 2017-07-15 (×3): 50 ug via INTRAVENOUS

## 2017-07-15 MED ORDER — FENTANYL CITRATE (PF) 100 MCG/2ML IJ SOLN
25.0000 ug | INTRAMUSCULAR | Status: DC | PRN
  Administered 2017-07-15 (×4): 25 ug via INTRAVENOUS

## 2017-07-15 MED ORDER — MIDAZOLAM HCL 2 MG/2ML IJ SOLN
INTRAMUSCULAR | Status: AC
  Filled 2017-07-15: qty 2

## 2017-07-15 SURGICAL SUPPLY — 25 items
BANDAGE ACE 4X5 VEL STRL LF (GAUZE/BANDAGES/DRESSINGS) IMPLANT
BLADE INCISOR PLUS 4.5 (BLADE) ×3 IMPLANT
CHLORAPREP W/TINT 26ML (MISCELLANEOUS) ×3 IMPLANT
CUFF TOURN 24 STER (MISCELLANEOUS) IMPLANT
CUFF TOURN 30 STER DUAL PORT (MISCELLANEOUS) ×3 IMPLANT
GAUZE SPONGE 4X4 12PLY STRL (GAUZE/BANDAGES/DRESSINGS) ×3 IMPLANT
GLOVE SURG SYN 9.0  PF PI (GLOVE) ×1
GLOVE SURG SYN 9.0 PF PI (GLOVE) ×2 IMPLANT
GOWN SRG 2XL LVL 4 RGLN SLV (GOWNS) ×2 IMPLANT
GOWN STRL NON-REIN 2XL LVL4 (GOWNS) ×1
GOWN STRL REUS W/ TWL LRG LVL3 (GOWN DISPOSABLE) ×4 IMPLANT
GOWN STRL REUS W/TWL LRG LVL3 (GOWN DISPOSABLE) ×2
IV LACTATED RINGER IRRG 3000ML (IV SOLUTION) ×2
IV LR IRRIG 3000ML ARTHROMATIC (IV SOLUTION) ×4 IMPLANT
KIT TURNOVER KIT A (KITS) ×3 IMPLANT
MANIFOLD NEPTUNE II (INSTRUMENTS) ×3 IMPLANT
PACK ARTHROSCOPY KNEE (MISCELLANEOUS) ×3 IMPLANT
SCALPEL PROTECTED #11 DISP (BLADE) ×3 IMPLANT
SET TUBE SUCT SHAVER OUTFL 24K (TUBING) ×3 IMPLANT
SET TUBE TIP INTRA-ARTICULAR (MISCELLANEOUS) ×3 IMPLANT
SUT ETHILON 4-0 (SUTURE) ×1
SUT ETHILON 4-0 FS2 18XMFL BLK (SUTURE) ×2
SUTURE ETHLN 4-0 FS2 18XMF BLK (SUTURE) ×2 IMPLANT
TUBING ARTHRO INFLOW-ONLY STRL (TUBING) ×3 IMPLANT
WAND COBLATION FLOW 50 (SURGICAL WAND) ×3 IMPLANT

## 2017-07-15 NOTE — Anesthesia Postprocedure Evaluation (Signed)
Anesthesia Post Note  Patient: Joshua RibasKenyon Ramirez  Procedure(s) Performed: KNEE ARTHROSCOPY WITH MEDIAL MENISECTOMY (Right ) MENISECTOMY (Right Knee)  Patient location during evaluation: PACU Anesthesia Type: General Level of consciousness: awake and alert Pain management: pain level controlled Vital Signs Assessment: post-procedure vital signs reviewed and stable Respiratory status: spontaneous breathing, nonlabored ventilation, respiratory function stable and patient connected to nasal cannula oxygen Cardiovascular status: blood pressure returned to baseline and stable Postop Assessment: no apparent nausea or vomiting Anesthetic complications: no     Last Vitals:  Vitals:   07/15/17 1227 07/15/17 1236  BP: 111/69 (!) 133/91  Pulse: 89 89  Resp: 15 16  Temp: (!) 36.3 C   SpO2: 93% 95%    Last Pain:  Vitals:   07/15/17 1236  PainSc: 3                  Zeena Starkel S

## 2017-07-15 NOTE — Anesthesia Post-op Follow-up Note (Signed)
Anesthesia QCDR form completed.        

## 2017-07-15 NOTE — Anesthesia Procedure Notes (Signed)
Procedure Name: LMA Insertion Date/Time: 07/15/2017 11:20 AM Performed by: Junious SilkNoles, Talbert Trembath, CRNA Pre-anesthesia Checklist: Patient identified, Patient being monitored, Timeout performed, Emergency Drugs available and Suction available Patient Re-evaluated:Patient Re-evaluated prior to induction Oxygen Delivery Method: Circle system utilized Preoxygenation: Pre-oxygenation with 100% oxygen Induction Type: IV induction Ventilation: Mask ventilation without difficulty LMA: LMA inserted LMA Size: 4.5 Tube type: Oral Number of attempts: 1 Placement Confirmation: positive ETCO2 and breath sounds checked- equal and bilateral Tube secured with: Tape Dental Injury: Teeth and Oropharynx as per pre-operative assessment

## 2017-07-15 NOTE — Transfer of Care (Signed)
Immediate Anesthesia Transfer of Care Note  Patient: Colette RibasKenyon Raucci  Procedure(s) Performed: KNEE ARTHROSCOPY WITH MEDIAL MENISECTOMY (Right ) MENISECTOMY (Right Knee)  Patient Location: PACU  Anesthesia Type:General  Level of Consciousness: sedated  Airway & Oxygen Therapy: Patient Spontanous Breathing and Patient connected to face mask oxygen  Post-op Assessment: Report given to RN and Post -op Vital signs reviewed and stable  Post vital signs: Reviewed and stable  Last Vitals:  Vitals:   07/15/17 1141 07/15/17 1143  BP:  135/88  Pulse:  (!) 107  Resp:  13  Temp: 36.4 C   SpO2:  92%    Last Pain:  Vitals:   07/15/17 1000  PainSc: 6          Complications: No apparent anesthesia complications

## 2017-07-15 NOTE — Op Note (Signed)
07/15/2017  11:47 AM  PATIENT:  Joshua RibasKenyon Ramirez  42 y.o. male  PRE-OPERATIVE DIAGNOSIS:  INTERNAL DERANGEMENT OF KNEE JOINT RIGHT  POST-OPERATIVE DIAGNOSIS:  INTERNAL DERANGEMENT OF KNEE JOINT RIGHT medial meniscus tear and patella subluxation  PROCEDURE:  Procedure(s): KNEE ARTHROSCOPY WITH MEDIAL MENISECTOMY (Right) MENISECTOMY (Right)  SURGEON: Leitha SchullerMichael J Ireene Ballowe, MD  ASSISTANTS: None  ANESTHESIA:   general  EBL:  No intake/output data recorded.  BLOOD ADMINISTERED:none  DRAINS: none   LOCAL MEDICATIONS USED:  MARCAINE     SPECIMEN:  No Specimen  DISPOSITION OF SPECIMEN:  N/A  COUNTS:  YES  TOURNIQUET:  * No tourniquets in log *  IMPLANTS: None  DICTATION: .Dragon Dictation patient brought the operating room and after adequate anesthesia was obtained the right leg was prepped and draped in usual fashion with a tourniquet applied on both arthroscopic leg holder.  After patient identification and timeout procedures were completed, inferior lateral portal was made and the arthroscope introduced.  Inspection revealed patellar subluxation with a tight lateral retinaculum with moderate chondromalacia of the femoral trochlea and central facet and lateral facet of the patella.  There is also some synovitis present in the suprapatellar pouch and in the patellofemoral joint which was subsequently ablated.  Come around the medial compartment there is a small area in the posterior femoral condyle approximately centimeter diameter with early degenerative changes loss of the superficial layers but no fissuring.  Inferior medial portal was made on probing of the medial meniscus is quite thin consistent with prior partial meniscectomy there is a small tear of the posterior third at the junction of the middle and posterior thirds which was ablated with use of a wand.  Going to the notch the ACL appeared to have a partial tear with some anterior fibers loose and this was ablated as it looked like it  might be impinging in the lateral compartment lateral compartment itself was normal at this point the lateral release was portal performed and patella appeared to track better.  The knee was irrigated until clear and altered mentation was withdrawn.  30 cc half percent Sensorcaine was stopped with epinephrine was infiltrated in the area of the incisions as well as lateral release for postop analgesia the wounds are closed with simple interrupted 4-0 nylon and a sterile dressing of Xeroform 4 x 4's web roll and Ace wrap applied  PLAN OF CARE: Discharge to home after PACU  PATIENT DISPOSITION:  PACU - hemodynamically stable.

## 2017-07-15 NOTE — Anesthesia Preprocedure Evaluation (Signed)
Anesthesia Evaluation  Patient identified by MRN, date of birth, ID band Patient awake    Reviewed: Allergy & Precautions, NPO status , Patient's Chart, lab work & pertinent test results, reviewed documented beta blocker date and time   Airway Mallampati: III  TM Distance: >3 FB     Dental  (+) Chipped   Pulmonary sleep apnea ,           Cardiovascular      Neuro/Psych    GI/Hepatic GERD  Controlled,  Endo/Other    Renal/GU      Musculoskeletal  (+) Arthritis ,   Abdominal   Peds  Hematology   Anesthesia Other Findings Obese.  Reproductive/Obstetrics                             Anesthesia Physical Anesthesia Plan  ASA: III  Anesthesia Plan: General   Post-op Pain Management:    Induction: Intravenous  PONV Risk Score and Plan:   Airway Management Planned: LMA  Additional Equipment:   Intra-op Plan:   Post-operative Plan:   Informed Consent: I have reviewed the patients History and Physical, chart, labs and discussed the procedure including the risks, benefits and alternatives for the proposed anesthesia with the patient or authorized representative who has indicated his/her understanding and acceptance.     Plan Discussed with: CRNA  Anesthesia Plan Comments:         Anesthesia Quick Evaluation

## 2017-07-15 NOTE — Discharge Instructions (Addendum)
AMBULATORY SURGERY  DISCHARGE INSTRUCTIONS   1) The drugs that you were given will stay in your system until tomorrow so for the next 24 hours you should not:  A) Drive an automobile B) Make any legal decisions C) Drink any alcoholic beverage   2) You may resume regular meals tomorrow.  Today it is better to start with liquids and gradually work up to solid foods.  You may eat anything you prefer, but it is better to start with liquids, then soup and crackers, and gradually work up to solid foods.   3) Please notify your doctor immediately if you have any unusual bleeding, trouble breathing, redness and pain at the surgery site, drainage, fever, or pain not relieved by medication. 4)   5) Your post-operative visit with Dr.                                     is: Date:                        Time:    Please call to schedule your post-operative visit.  6) Additional Instructions:       Weightbearing as tolerated on right leg.  Pain medicine as directed.  Keep dressing clean and dry.  Aspirin 325 mg daily until walking normally

## 2017-07-15 NOTE — H&P (Signed)
Reviewed paper H+P, will be scanned into chart. No changes noted.  

## 2017-07-27 ENCOUNTER — Ambulatory Visit (INDEPENDENT_AMBULATORY_CARE_PROVIDER_SITE_OTHER): Payer: BC Managed Care – PPO

## 2017-07-27 ENCOUNTER — Telehealth: Payer: Self-pay

## 2017-07-27 DIAGNOSIS — Z23 Encounter for immunization: Secondary | ICD-10-CM | POA: Diagnosis not present

## 2017-07-27 NOTE — Telephone Encounter (Signed)
Patient following on CPAP status please call back [phone]. Has not heard back on status.

## 2017-07-27 NOTE — Progress Notes (Signed)
Patient comes in for second hepatitis B injection . Injected left deltoid.   Patient tolerated injection well.

## 2017-07-28 NOTE — Telephone Encounter (Signed)
We have not received any information as of yet on CPAP.  Order has been sent.

## 2017-08-06 NOTE — Telephone Encounter (Signed)
Pt is calling, he says that he had sleep study over a month ago, and that he is upset that he has not gotten any calls back. He would like to have the next steps of what needs to be done for his sleep study and a call back even If you do not have any info.[phone]

## 2017-08-06 NOTE — Telephone Encounter (Signed)
I do not see where sleep study has resulted sometimes these take about 4 to 6 weeks.

## 2017-08-06 NOTE — Telephone Encounter (Signed)
He had + sleep study OSA I told Mal AmabileBrock to call him a while ago and we already ordered supplies they are trying to reach him but have been unable   TMS

## 2017-08-06 NOTE — Telephone Encounter (Signed)
Spoke with patient advised CPAP supplies have been ordered . He [employer] contacted him and stated that they were working on getting coverage through insurance.  He still has not heard anything from them.  Dr Shirlee LatchMclean believes they were ordered through [facility].

## 2017-08-17 ENCOUNTER — Ambulatory Visit: Payer: BC Managed Care – PPO

## 2017-09-06 ENCOUNTER — Ambulatory Visit: Payer: BC Managed Care – PPO | Admitting: Internal Medicine

## 2017-09-06 ENCOUNTER — Encounter: Payer: Self-pay | Admitting: Internal Medicine

## 2017-09-06 VITALS — BP 124/70 | HR 73 | Temp 98.4°F | Ht 72.0 in | Wt 279.6 lb

## 2017-09-06 DIAGNOSIS — Z9989 Dependence on other enabling machines and devices: Secondary | ICD-10-CM | POA: Diagnosis not present

## 2017-09-06 DIAGNOSIS — M25561 Pain in right knee: Secondary | ICD-10-CM | POA: Diagnosis not present

## 2017-09-06 DIAGNOSIS — G4733 Obstructive sleep apnea (adult) (pediatric): Secondary | ICD-10-CM | POA: Diagnosis not present

## 2017-09-06 DIAGNOSIS — A048 Other specified bacterial intestinal infections: Secondary | ICD-10-CM | POA: Diagnosis not present

## 2017-09-06 DIAGNOSIS — E785 Hyperlipidemia, unspecified: Secondary | ICD-10-CM | POA: Diagnosis not present

## 2017-09-06 DIAGNOSIS — K219 Gastro-esophageal reflux disease without esophagitis: Secondary | ICD-10-CM | POA: Insufficient documentation

## 2017-09-06 DIAGNOSIS — G8929 Other chronic pain: Secondary | ICD-10-CM | POA: Diagnosis not present

## 2017-09-06 HISTORY — DX: Gastro-esophageal reflux disease without esophagitis: K21.9

## 2017-09-06 MED ORDER — PANTOPRAZOLE SODIUM 40 MG PO TBEC: 40.0000 mg | DELAYED_RELEASE_TABLET | Freq: Every evening | ORAL | 3 refills | Status: DC

## 2017-09-06 NOTE — Patient Instructions (Addendum)
Last hepatitis B vaccine due 12/21/17  Schedule appt with me am appt 12/23/17   Cholesterol Cholesterol is a white, waxy, fat-like substance that is needed by the human body in small amounts. The liver makes all the cholesterol we need. Cholesterol is carried from the liver by the blood through the blood vessels. Deposits of cholesterol (plaques) may build up on blood vessel (artery) walls. Plaques make the arteries narrower and stiffer. Cholesterol plaques increase the risk for heart attack and stroke. You cannot feel your cholesterol level even if it is very high. The only way to know that it is high is to have a blood test. Once you know your cholesterol levels, you should keep a record of the test results. Work with your health care provider to keep your levels in the desired range. What do the results mean?  Total cholesterol is a rough measure of all the cholesterol in your blood.  LDL (low-density lipoprotein) is the "bad" cholesterol. This is the type that causes plaque to build up on the artery walls. You want this level to be low.  HDL (high-density lipoprotein) is the "good" cholesterol because it cleans the arteries and carries the LDL away. You want this level to be high.  Triglycerides are fat that the body can either burn for energy or store. High levels are closely linked to heart disease. What are the desired levels of cholesterol?  Total cholesterol below 200.  LDL below 100 for people who are at risk, below 70 for people at very high risk.  HDL above 40 is good. A level of 60 or higher is considered to be protective against heart disease.  Triglycerides below 150. How can I lower my cholesterol? Diet Follow your diet program as told by your health care provider.  Choose fish or white meat chicken and [place], roasted or baked. Limit fatty cuts of red meat, fried foods, and processed meats, such as sausage and lunch meats.  Eat lots of fresh fruits and  vegetables.  Choose whole grains, beans, pasta, potatoes, and cereals.  Choose olive oil, corn oil, or canola oil, and use only small amounts.  Avoid butter, mayonnaise, shortening, or palm kernel oils.  Avoid foods with trans fats.  Drink skim or nonfat milk and eat low-fat or nonfat yogurt and cheeses. Avoid whole milk, cream, ice cream, egg yolks, and full-fat cheeses.  Healthier desserts include angel food cake, ginger snaps, animal crackers, hard candy, popsicles, and low-fat or nonfat frozen yogurt. Avoid pastries, cakes, pies, and cookies.  Exercise  Follow your exercise program as told by your health care provider. A regular program: ? Helps to decrease LDL and raise HDL. ? Helps with weight control.  Do things that increase your activity level, such as gardening, walking, and taking the stairs.  Ask your health care provider about ways that you can be more active in your daily life.  Medicine  Take over-the-counter and prescription medicines only as told by your health care provider. ? Medicine may be prescribed by your health care provider to help lower cholesterol and decrease the risk for heart disease. This is usually done if diet and exercise have failed to bring down cholesterol levels. ? If you have several risk factors, you may need medicine even if your levels are normal.  This information is not intended to replace advice given to you by your health care provider. Make sure you discuss any questions you have with your health care provider. Document Released: 02/03/2001  Document Revised: 12/07/2015 Document Reviewed: 11/09/2015 Elsevier Interactive Patient Education  2018 [employer] Inc.  Helicobacter Pylori Infection Helicobacter pylori infection is an infection in the stomach that is caused by the Helicobacter pylori (H. pylori) bacteria. This type of bacteria often lives in the lining of the stomach. The infection can cause ulcers and irritation (gastritis) in  some people. It is the most common cause of ulcers in the stomach (gastric ulcer) and in the upper part of the intestine (duodenal ulcer). Having this infection may also increase the risk of stomach cancer and a type of white blood cell cancer (lymphoma) that affects the stomach. What are the causes? H. pylori is a type of bacteria that is often found in the stomachs of healthy people. The bacteria may be passed from person to person through contact with stool or saliva. It is not known why some people develop ulcers, gastritis, or cancer from the infection. What increases the risk? This condition is more likely to develop in people who:  Have family members with the infection.  Live with many other people, such as in a dormitory.  Are of African, Hispanic, or Asian descent.  What are the signs or symptoms? Most people with this infection do not have symptoms. If you do have symptoms, they may include:  Heartburn.  Stomach pain.  Nausea.  Vomiting.  Blood-tinged vomit.  Loss of appetite.  Bad breath.  How is this diagnosed? This condition may be diagnosed based on your symptoms, a physical exam, and various tests. Tests may include:  Blood tests or stool tests to check for the proteins (antibodies) that your body may produce in response to the bacteria. These tests are the best way to confirm the diagnosis.  A breath test to check for the type of gas that the H. pylori bacteria release after breaking down a substance called urea. For the test, you are asked to drink urea. This test is often done after treatment in order to find out if the treatment worked.  A procedure in which a thin, flexible tube with a tiny camera at the end is placed into your stomach and upper intestine (upper endoscopy). Your health care provider may also take tissue samples (biopsy) to test for H. pylori and cancer.  How is this treated? Treatment for this condition usually involves taking a combination  of medicines (triple therapy) for a couple of weeks. Triple therapy includes one medicine to reduce the acid in your stomach and two types of antibiotic medicines. Many drug combinations have been approved for treatment. Treatment usually kills the H. pylori and reduces your risk of cancer. You may need to be tested for H. pylori again after treatment. In some cases, the treatment may need to be repeated. Follow these instructions at home:  Take over-the-counter and prescription medicines only as told by your health care provider.  Take your antibiotics as told by your health care provider. Do not stop taking the antibiotics even if you start to feel better.  You can do all your usual activities and eat what you usually do.  Take steps to prevent future infections: ? Wash your hands often. ? Make sure the food you eat has been properly prepared. ? Drink water only from clean sources.  Keep all follow-up visits as told by your health care provider. This is important. Contact a health care provider if:  Your symptoms do not get better.  Your symptoms return after treatment. This information is not intended to  replace advice given to you by your health care provider. Make sure you discuss any questions you have with your health care provider. Document Released: 09/02/2015 Document Revised: 10/17/2015 Document Reviewed: 05/23/2014 Elsevier Interactive Patient Education  2018 [employer].

## 2017-09-06 NOTE — Progress Notes (Signed)
Chief Complaint  Patient presents with  . Follow-up   F/u  1. Reviewed labs will repeat H pylori s/p tx upcoming will be 3 months out from tx GERd sxs improved  2. OSA on cpap compliant using and benefits. He reports sweating with full mask and getting bumpy rash to face he wants nasal pillows/prongs instead of full face mask contacted [facility] who stated he needed Rx from PCP  3. Saw Dr. Rosita Kea today s/p arthroscopy 06/2017 for injection in the right knee knee is doing some better    Review of Systems  Constitutional: Negative for weight loss.  HENT: Negative for hearing loss.   Eyes: Negative for blurred vision.  Respiratory: Negative for shortness of breath.   Cardiovascular: Negative for chest pain.  Gastrointestinal: Negative for heartburn.       GERD improved  Musculoskeletal: Positive for joint pain.       Chronic right knee pain   Skin: Positive for rash.  Neurological: Negative for headaches.  Psychiatric/Behavioral: Negative for depression.   Past Medical History:  Diagnosis Date  . Arthritis    knee s/p right knee surgery meniscal tear with bone fragments, ankle (ortho Dr. Rosita Kea)  . GERD (gastroesophageal reflux disease)   . H. pylori infection   . OSA on CPAP   . Sleep apnea 06/2017   PT JUST WAS DX WITH SLEEP APNEA LAST WEEK AND IS WAITING ON HIS CPAP MACHINE   Past Surgical History:  Procedure Laterality Date  . KIDNEY DONATION     2004  . KNEE ARTHROSCOPY WITH MEDIAL MENISECTOMY Right 07/15/2017   Procedure: KNEE ARTHROSCOPY WITH MEDIAL MENISECTOMY;  Surgeon: Kennedy Bucker, MD;  Location: [facility] ORS;  Service: Orthopedics;  Laterality: Right;  . meniscal tear     right knee Dr. Rosita Kea 2014   . MENISECTOMY Right 07/15/2017   Procedure: MENISECTOMY;  Surgeon: Kennedy Bucker, MD;  Location: [facility] ORS;  Service: Orthopedics;  Laterality: Right;   Family History  Problem Relation Age of Onset  . Drug abuse Mother   . Hyperlipidemia Mother   . Hypertension Mother   .  Cancer Maternal Aunt        brain, breast, lung ? primary was smoker    Social History   Socioeconomic History  . Marital status: Married    Spouse name: Not on file  . Number of children: Not on file  . Years of education: Not on file  . Highest education level: Not on file  Occupational History  . Not on file  Social Needs  . Financial resource strain: Not on file  . Food insecurity:    Worry: Not on file    Inability: Not on file  . Transportation needs:    Medical: Not on file    Non-medical: Not on file  Tobacco Use  . Smoking status: Never Smoker  . Smokeless tobacco: Never Used  . Tobacco comment: light in the past social smoker quit  Substance and Sexual Activity  . Alcohol use: Yes    Comment: OCC  . Drug use: No  . Sexual activity: Yes  Lifestyle  . Physical activity:    Days per week: Not on file    Minutes per session: Not on file  . Stress: Not on file  Relationships  . Social connections:    Talks on phone: Not on file    Gets together: Not on file    Attends religious service: Not on file    Active member of  club or organization: Not on file    Attends meetings of clubs or organizations: Not on file    Relationship status: Not on file  . Intimate partner violence:    Fear of current or ex partner: Not on file    Emotionally abused: Not on file    Physically abused: Not on file    Forced sexual activity: Not on file  Other Topics Concern  . 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   No Known Allergies Recent Results (from the past 2160 hour(s))  Comprehensive metabolic panel     Status: None   Collection Time: 06/17/17 10:43 AM  Result Value Ref Range   Sodium 139 135 - 145 mEq/L   Potassium  4.3 3.5 - 5.1 mEq/L   Chloride 102 96 - 112 mEq/L   CO2 31 19 - 32 mEq/L   Glucose, Bld 95 70 - 99 mg/dL   BUN 16 6 - 23 mg/dL   Creatinine, Ser 1.611.31 0.40 - 1.50 mg/dL   Total Bilirubin 0.6 0.2 - 1.2 mg/dL   Alkaline Phosphatase 74 39 - 117 U/L   AST 25 0 - 37 U/L   ALT 35 0 - 53 U/L   Total Protein 7.5 6.0 - 8.3 g/dL   Albumin 4.2 3.5 - 5.2 g/dL   Calcium 9.6 8.4 - 09.610.5 mg/dL   GFR 04.5463.40 >09.81>60.00 mL/min  CBC with Differential/Platelet     Status: Abnormal   Collection Time: 06/17/17 10:43 AM  Result Value Ref Range   WBC 5.9 4.0 - 10.5 K/uL   RBC 5.55 4.22 - 5.81 Mil/uL   Hemoglobin 15.1 13.0 - 17.0 g/dL   HCT 19.146.7 47.839.0 - 29.552.0 %   MCV 84.2 78.0 - 100.0 fl   MCHC 32.2 30.0 - 36.0 g/dL   RDW 62.114.8 30.811.5 - 65.715.5 %   Platelets 221.0 150.0 - 400.0 K/uL   Neutrophils Relative % 40.6 (L) 43.0 - 77.0 %   Lymphocytes Relative 39.1 12.0 - 46.0 %   Monocytes Relative 11.8 3.0 - 12.0 %   Eosinophils Relative 7.7 (H) 0.0 - 5.0 %   Basophils Relative 0.8 0.0 - 3.0 %   Neutro Abs 2.4 1.4 - 7.7 K/uL   Lymphs Abs 2.3 0.7 - 4.0 K/uL   Monocytes Absolute 0.7 0.1 - 1.0 K/uL   Eosinophils Absolute 0.5 0.0 - 0.7 K/uL   Basophils Absolute 0.0 0.0 - 0.1 K/uL  T4, free     Status: None   Collection Time: 06/17/17 10:43 AM  Result Value Ref Range   Free T4 0.99 0.60 - 1.60 ng/dL    Comment: Specimens from patients who are undergoing biotin therapy and /or ingesting biotin supplements may contain high levels of biotin.  The higher biotin concentration in these specimens interferes with this Free T4 assay.  Specimens that contain high levels  of biotin may cause false high results for this Free T4 assay.  Please interpret results in light of the total clinical presentation of the patient.    TSH     Status: None   Collection Time: 06/17/17 10:43 AM  Result Value Ref Range   TSH 2.16 0.35 - 4.50 uIU/mL  Urinalysis, Routine w reflex microscopic  Status: None   Collection Time: 06/17/17 10:43 AM   Result Value Ref Range   Color, Urine YELLOW Yellow;Lt. Yellow   APPearance CLEAR Clear   Specific Gravity, Urine 1.015 1.000 - 1.030   pH 7.5 5.0 - 8.0   Total Protein, Urine NEGATIVE Negative   Urine Glucose NEGATIVE Negative   Ketones, ur NEGATIVE Negative   Bilirubin Urine NEGATIVE Negative   Hgb urine dipstick NEGATIVE Negative   Urobilinogen, UA 0.2 0.0 - 1.0   Leukocytes, UA NEGATIVE Negative   Nitrite NEGATIVE Negative   WBC, UA none seen 0-2/hpf   RBC / HPF none seen 0-2/hpf  Lipid panel     Status: Abnormal   Collection Time: 06/17/17 10:43 AM  Result Value Ref Range   Cholesterol 210 (H) 0 - 200 mg/dL    Comment: ATP III Classification       Desirable:  < 200 mg/dL               Borderline High:  200 - 239 mg/dL          High:  > = 161 mg/dL   Triglycerides 096.0 0.0 - 149.0 mg/dL    Comment: Normal:  <454 mg/dLBorderline High:  150 - 199 mg/dL   HDL 09.81 (L) >19.14 mg/dL   VLDL 78.2 0.0 - 95.6 mg/dL   LDL Cholesterol 213 (H) 0 - 99 mg/dL   Total CHOL/HDL Ratio 6     Comment:                Men          Women1/2 Average Risk     3.4          3.3Average Risk          5.0          4.42X Average Risk          9.6          7.13X Average Risk          15.0          11.0                       NonHDL 174.49     Comment: NOTE:  Non-HDL goal should be 30 mg/dL higher than patient's LDL goal (i.e. LDL goal of < 70 mg/dL, would have non-HDL goal of < 100 mg/dL)  H Pylori, IGM, IGG, IGA AB     Status: Abnormal   Collection Time: 06/17/17 10:43 AM  Result Value Ref Range   H. pylori, IgG AbS <0.80 0.00 - 0.79 Index Value    Comment:                              Negative           <0.80                              Equivocal    0.80 - 0.89                              Positive           >0.89    H. pylori, IgA Abs 14.4 (H) 0.0 - 8.9 units    Comment:  Negative          <9.0                                 Equivocal   9.0 - 11.0                                  Positive         >11.0    H pylori, IgM Abs 11.8 (H) 0.0 - 8.9 units    Comment:                                 Negative          <9.0                                 Equivocal   9.0 - 11.0                                 Positive         >11.0 This test was developed and its performance characteristics determined by [facility]. It has not been cleared or approved by the Food and Drug Administration.    Objective  Body mass index is 37.92 kg/m. Wt Readings from Last 3 Encounters:  09/06/17 279 lb 9.6 oz (126.8 kg)  07/15/17 275 lb (124.7 kg)  06/17/17 280 lb 4 oz (127.1 kg)   Temp Readings from Last 3 Encounters:  09/06/17 98.4 F (36.9 C) (Oral)  07/15/17 (!) 97.4 F (36.3 C)  06/17/17 98.5 F (36.9 C) (Oral)   BP Readings from Last 3 Encounters:  09/06/17 124/70  07/15/17 (!) 133/91  06/17/17 130/90   Pulse Readings from Last 3 Encounters:  09/06/17 73  07/15/17 89  06/17/17 90    Physical Exam  Constitutional: He is oriented to person, place, and time. Vital signs are normal. He appears well-developed and well-nourished.  HENT:  Head: Normocephalic and atraumatic.  Mouth/Throat: Oropharynx is clear and moist and mucous membranes are normal.  Eyes: Pupils are equal, round, and reactive to light. Conjunctivae are normal.  Cardiovascular: Normal rate, regular rhythm and normal heart sounds.  Pulmonary/Chest: Effort normal and breath sounds normal.  Neurological: He is alert and oriented to person, place, and time. Gait normal.  Skin: Skin is warm, dry and intact.  Small papules around mouth/chin likely 2/2 heat   Psychiatric: He has a normal mood and affect. His speech is normal and behavior is normal. Judgment and thought content normal. Cognition and memory are normal.  Nursing note and vitals reviewed.   Assessment   1. HLD  2. h pylori/GERD 3. OSA on cpap  4. Chronic right knee pain  5. HM Plan  1.  Given handout today cholesterol  Repeat  12/23/17   2. Check H pylori lab next week  3 D/c full face mask Rx nasal prongs/pillow with chin strap Fax [facility]  4. F/u ortho Dr. Rosita Kea saw today  5. UTD flu UTD Tdap  Hep B status not immune according to labs 09/04/16 titer <3.1 had 2/3 vaccines 3rd dose due 12/23/17 f/u   Provider: Dr. French Ana McLean-Scocuzza-Internal Medicine

## 2017-09-06 NOTE — Progress Notes (Signed)
Pre visit review using our clinic review tool, if applicable. No additional management support is needed unless otherwise documented below in the visit note. 

## 2017-09-15 ENCOUNTER — Ambulatory Visit: Payer: BC Managed Care – PPO | Admitting: Internal Medicine

## 2017-09-15 NOTE — Progress Notes (Signed)
Information has already been faxed. 

## 2017-09-16 ENCOUNTER — Other Ambulatory Visit: Payer: BC Managed Care – PPO

## 2017-09-23 ENCOUNTER — Other Ambulatory Visit (INDEPENDENT_AMBULATORY_CARE_PROVIDER_SITE_OTHER): Payer: BC Managed Care – PPO

## 2017-09-23 DIAGNOSIS — A048 Other specified bacterial intestinal infections: Secondary | ICD-10-CM | POA: Diagnosis not present

## 2017-09-23 DIAGNOSIS — E785 Hyperlipidemia, unspecified: Secondary | ICD-10-CM

## 2017-09-23 LAB — LIPID PANEL
Cholesterol: 209 mg/dL — ABNORMAL HIGH (ref 0–200)
HDL: 36.8 mg/dL — AB (ref >39.00)
LDL Cholesterol: 138 mg/dL — ABNORMAL HIGH (ref 0–99)
NONHDL: 172.13
Total CHOL/HDL Ratio: 6
Triglycerides: 169 mg/dL — ABNORMAL HIGH (ref 0.0–149.0)
VLDL: 33.8 mg/dL (ref 0.0–40.0)

## 2017-09-28 LAB — H PYLORI, IGM, IGG, IGA AB
H. PYLORI, IGM ABS: 12.6 U — AB (ref 0.0–8.9)
H. pylori, IgA Abs: 11.3 units — ABNORMAL HIGH (ref 0.0–8.9)
H. pylori, IgG AbS: <0.8 Index Value (ref 0.00–0.79)

## 2017-10-01 ENCOUNTER — Other Ambulatory Visit: Payer: Self-pay | Admitting: Internal Medicine

## 2017-10-01 DIAGNOSIS — A048 Other specified bacterial intestinal infections: Secondary | ICD-10-CM

## 2017-10-01 MED ORDER — PANTOPRAZOLE SODIUM 40 MG PO TBEC: 40.0000 mg | DELAYED_RELEASE_TABLET | Freq: Two times a day (BID) | ORAL | 0 refills | Status: DC

## 2017-10-01 MED ORDER — BISMUTH SUBSALICYLATE 525 MG/15ML PO SUSP: 300.0000 mg | Freq: Four times a day (QID) | ORAL | 0 refills | Status: DC

## 2017-10-01 MED ORDER — METRONIDAZOLE 250 MG PO TABS: 250.0000 mg | ORAL_TABLET | Freq: Four times a day (QID) | ORAL | 0 refills | Status: DC

## 2017-10-01 MED ORDER — TETRACYCLINE HCL 500 MG PO CAPS: 500.0000 mg | ORAL_CAPSULE | Freq: Four times a day (QID) | ORAL | 0 refills | Status: DC

## 2017-12-23 ENCOUNTER — Ambulatory Visit: Payer: BC Managed Care – PPO | Admitting: Internal Medicine

## 2017-12-23 ENCOUNTER — Ambulatory Visit: Payer: BC Managed Care – PPO

## 2017-12-23 ENCOUNTER — Encounter: Payer: Self-pay | Admitting: Internal Medicine

## 2017-12-23 VITALS — BP 108/64 | HR 106 | Temp 98.4°F | Resp 16 | Ht 72.0 in | Wt 270.4 lb

## 2017-12-23 DIAGNOSIS — G4733 Obstructive sleep apnea (adult) (pediatric): Secondary | ICD-10-CM

## 2017-12-23 DIAGNOSIS — K219 Gastro-esophageal reflux disease without esophagitis: Secondary | ICD-10-CM | POA: Diagnosis not present

## 2017-12-23 DIAGNOSIS — M6208 Separation of muscle (nontraumatic), other site: Secondary | ICD-10-CM

## 2017-12-23 DIAGNOSIS — K439 Ventral hernia without obstruction or gangrene: Secondary | ICD-10-CM

## 2017-12-23 DIAGNOSIS — M25561 Pain in right knee: Secondary | ICD-10-CM | POA: Insufficient documentation

## 2017-12-23 DIAGNOSIS — E785 Hyperlipidemia, unspecified: Secondary | ICD-10-CM

## 2017-12-23 DIAGNOSIS — G8929 Other chronic pain: Secondary | ICD-10-CM

## 2017-12-23 DIAGNOSIS — A048 Other specified bacterial intestinal infections: Secondary | ICD-10-CM | POA: Diagnosis not present

## 2017-12-23 DIAGNOSIS — R112 Nausea with vomiting, unspecified: Secondary | ICD-10-CM | POA: Diagnosis not present

## 2017-12-23 DIAGNOSIS — Z9989 Dependence on other enabling machines and devices: Secondary | ICD-10-CM

## 2017-12-23 DIAGNOSIS — Z23 Encounter for immunization: Secondary | ICD-10-CM | POA: Diagnosis not present

## 2017-12-23 MED ORDER — PANTOPRAZOLE SODIUM 40 MG PO TBEC: 40.0000 mg | DELAYED_RELEASE_TABLET | Freq: Every evening | ORAL | 3 refills | Status: DC

## 2017-12-23 MED ORDER — DOXYCYCLINE HYCLATE 100 MG PO TABS: 100.0000 mg | ORAL_TABLET | Freq: Two times a day (BID) | ORAL | 0 refills | Status: DC

## 2017-12-23 NOTE — Patient Instructions (Addendum)
Fiber Flaxseed, Chia Seeds, Oatmeal (overnight), walnuts/almonds w/o salt food for cholesterol I believe you have diasthesis recti to abdomen review on web MD or [facility]  -we will refer you to surgery as soon as I figure out who is the best  I referred you to [name]  F/u in 3 months   Hepatitis B Vaccine, Recombinant injection What is this medicine? HEPATITIS B VACCINE (hep uh TAHY tis B VAK seen) is a vaccine. It is used to prevent an infection with the hepatitis B virus. This medicine may be used for other purposes; ask your health care provider or pharmacist if you have questions. COMMON BRAND NAME(S): Engerix-B, Recombivax HB What should I tell my health care provider before I take this medicine? They need to know if you have any of these conditions: -fever, infection -heart disease -hepatitis B infection -immune system problems -kidney disease -an unusual or allergic reaction to vaccines, yeast, other medicines, foods, dyes, or preservatives -pregnant or trying to get pregnant -breast-feeding How should I use this medicine? This vaccine is for injection into a muscle. It is given by a health care professional. A copy of Vaccine Information Statements will be given before each vaccination. Read this sheet carefully each time. The sheet may change frequently. Talk to your pediatrician regarding the use of this medicine in children. While this drug may be prescribed for children as young as newborn for selected conditions, precautions do apply. Overdosage: If you think you have taken too much of this medicine contact a poison control center or emergency room at once. NOTE: This medicine is only for you. Do not share this medicine with others. What if I miss a dose? It is important not to miss your dose. Call your doctor or health care professional if you are unable to keep an appointment. What may interact with this medicine? -medicines that suppress your immune function like  adalimumab, anakinra, infliximab -medicines to treat cancer -steroid medicines like prednisone or cortisone This list may not describe all possible interactions. Give your health care provider a list of all the medicines, herbs, non-prescription drugs, or dietary supplements you use. Also tell them if you smoke, drink alcohol, or use illegal drugs. Some items may interact with your medicine. What should I watch for while using this medicine? See your health care provider for all shots of this vaccine as directed. You must have 3 shots of this vaccine for protection from hepatitis B infection. Tell your doctor right away if you have any serious or unusual side effects after getting this vaccine. What side effects may I notice from receiving this medicine? Side effects that you should report to your doctor or health care professional as soon as possible: -allergic reactions like skin rash, itching or hives, swelling of the face, lips, or tongue -breathing problems -confused, irritated -fast, irregular heartbeat -flu-like syndrome -numb, tingling pain -seizures -unusually weak or tired Side effects that usually do not require medical attention (report to your doctor or health care professional if they continue or are bothersome): -diarrhea -fever -headache -loss of appetite -muscle pain -nausea -pain, redness, swelling, or irritation at site where injected -tiredness This list may not describe all possible side effects. Call your doctor for medical advice about side effects. You may report side effects to FDA at 1-800-FDA-1088. Where should I keep my medicine? This drug is given in a hospital or clinic and will not be stored at home. NOTE: This sheet is a summary. It may not  cover all possible information. If you have questions about this medicine, talk to your doctor, pharmacist, or health care provider.  2018 Elsevier/Gold Standard (2013-09-11 13:26:01)   Food Choices for  Gastroesophageal Reflux Disease, Adult When you have gastroesophageal reflux disease (GERD), the foods you eat and your eating habits are very important. Choosing the right foods can help ease your discomfort. What guidelines do I need to follow?  Choose fruits, vegetables, whole grains, and low-fat dairy products.  Choose low-fat meat, fish, and poultry.  Limit fats such as oils, salad dressings, butter, nuts, and avocado.  Keep a food diary. This helps you identify foods that cause symptoms.  Avoid foods that cause symptoms. These may be different for everyone.  Eat small meals often instead of 3 large meals a day.  Eat your meals slowly, in a place where you are relaxed.  Limit fried foods.  Cook foods using methods other than frying.  Avoid drinking alcohol.  Avoid drinking large amounts of liquids with your meals.  Avoid bending over or lying down until 2-3 hours after eating. What foods are not recommended? These are some foods and drinks that may make your symptoms worse: Vegetables Tomatoes. Tomato juice. Tomato and spaghetti sauce. Chili peppers. Onion and garlic. Horseradish. Fruits Oranges, grapefruit, and lemon (fruit and juice). Meats High-fat meats, fish, and poultry. This includes hot dogs, ribs, ham, sausage, salami, and bacon. Dairy Whole milk and chocolate milk. Sour cream. Cream. Butter. Ice cream. Cream cheese. Drinks Coffee and tea. Bubbly (carbonated) drinks or energy drinks. Condiments Hot sauce. Barbecue sauce. Sweets/Desserts Chocolate and cocoa. Donuts. Peppermint and spearmint. Fats and Oils High-fat foods. This includes [place] fries and potato chips. Other Vinegar. Strong spices. This includes black pepper, white pepper, red pepper, cayenne, curry powder, cloves, ginger, and chili powder. The items listed above may not be a complete list of foods and drinks to avoid. Contact your dietitian for more information. This information is not  intended to replace advice given to you by your health care provider. Make sure you discuss any questions you have with your health care provider. Document Released: 11/10/2011 Document Revised: 10/17/2015 Document Reviewed: 03/15/2013 Elsevier Interactive Patient Education  2017 Elsevier Inc.   Cholesterol Cholesterol is a white, waxy, fat-like substance that is needed by the human body in small amounts. The liver makes all the cholesterol we need. Cholesterol is carried from the liver by the blood through the blood vessels. Deposits of cholesterol (plaques) may build up on blood vessel (artery) walls. Plaques make the arteries narrower and stiffer. Cholesterol plaques increase the risk for heart attack and stroke. You cannot feel your cholesterol level even if it is very high. The only way to know that it is high is to have a blood test. Once you know your cholesterol levels, you should keep a record of the test results. Work with your health care provider to keep your levels in the desired range. What do the results mean?  Total cholesterol is a rough measure of all the cholesterol in your blood.  LDL (low-density lipoprotein) is the "bad" cholesterol. This is the type that causes plaque to build up on the artery walls. You want this level to be low.  HDL (high-density lipoprotein) is the "good" cholesterol because it cleans the arteries and carries the LDL away. You want this level to be high.  Triglycerides are fat that the body can either burn for energy or store. High levels are closely linked to heart disease.  What are the desired levels of cholesterol?  Total cholesterol below 200.  LDL below 100 for people who are at risk, below 70 for people at very high risk.  HDL above 40 is good. A level of 60 or higher is considered to be protective against heart disease.  Triglycerides below 150. How can I lower my cholesterol? Diet Follow your diet program as told by your health care  provider.  Choose fish or white meat chicken and [place], roasted or baked. Limit fatty cuts of red meat, fried foods, and processed meats, such as sausage and lunch meats.  Eat lots of fresh fruits and vegetables.  Choose whole grains, beans, pasta, potatoes, and cereals.  Choose olive oil, corn oil, or canola oil, and use only small amounts.  Avoid butter, mayonnaise, shortening, or palm kernel oils.  Avoid foods with trans fats.  Drink skim or nonfat milk and eat low-fat or nonfat yogurt and cheeses. Avoid whole milk, cream, ice cream, egg yolks, and full-fat cheeses.  Healthier desserts include angel food cake, ginger snaps, animal crackers, hard candy, popsicles, and low-fat or nonfat frozen yogurt. Avoid pastries, cakes, pies, and cookies.  Exercise  Follow your exercise program as told by your health care provider. A regular program: ? Helps to decrease LDL and raise HDL. ? Helps with weight control.  Do things that increase your activity level, such as gardening, walking, and taking the stairs.  Ask your health care provider about ways that you can be more active in your daily life.  Medicine  Take over-the-counter and prescription medicines only as told by your health care provider. ? Medicine may be prescribed by your health care provider to help lower cholesterol and decrease the risk for heart disease. This is usually done if diet and exercise have failed to bring down cholesterol levels. ? If you have several risk factors, you may need medicine even if your levels are normal.  This information is not intended to replace advice given to you by your health care provider. Make sure you discuss any questions you have with your health care provider. Document Released: 02/03/2001 Document Revised: 12/07/2015 Document Reviewed: 11/09/2015 Elsevier Interactive Patient Education  [employer].

## 2017-12-23 NOTE — Progress Notes (Addendum)
Chief Complaint  Patient presents with  . Follow-up    GERD, OSA   F/u  1. Still having GERD sx's air bubbles in chest and n/v after eating foods I.e burgers and wings last night. We have tx'ed x H pylori 2x though could not tolerate Tetracycline due to n/v/lightheadedness only but x 1 week but took flagyl and peptobismol.He has tried gas x for gas  2. Diasthesis recti having abdominal discomfort in the area  3. Right knee pain s/p 2 steroid injections in knee still having pain    Review of Systems  Constitutional: Positive for weight loss.  HENT: Negative for hearing loss.   Eyes: Negative for blurred vision.  Respiratory: Negative for shortness of breath.   Cardiovascular: Negative for chest pain.  Gastrointestinal: Positive for abdominal pain, heartburn, nausea and vomiting.  Musculoskeletal: Positive for joint pain.  Skin: Negative for rash.  Neurological: Negative for headaches.  Psychiatric/Behavioral: Negative for depression.   Past Medical History:  Diagnosis Date  . Arthritis    knee s/p right knee surgery meniscal tear with bone fragments, ankle (ortho Dr. Rosita Kea)  . GERD (gastroesophageal reflux disease)   . H. pylori infection   . OSA on CPAP   . Sleep apnea 06/2017   PT JUST WAS DX WITH SLEEP APNEA LAST WEEK AND IS WAITING ON HIS CPAP MACHINE   Past Surgical History:  Procedure Laterality Date  . KIDNEY DONATION     2004  . KNEE ARTHROSCOPY WITH MEDIAL MENISECTOMY Right 07/15/2017   Procedure: KNEE ARTHROSCOPY WITH MEDIAL MENISECTOMY;  Surgeon: Kennedy Bucker, MD;  Location: [facility] OR[facility];  Service: Orthopedics;  Laterality: Right;  . meniscal tear     right knee Dr. Rosita Kea 2014   . MENISECTOMY Right 07/15/2017   Procedure: MENISECTOMY;  Surgeon: Kennedy Bucker, MD;  Location: [facility] ORS;  Service: Orthopedics;  Laterality: Right;   Family History  Problem Relation Age of Onset  . Drug abuse Mother   . Hyperlipidemia Mother   . Hypertension Mother   . Cancer Maternal  Aunt        brain, breast, lung ? primary was smoker    Social History   Socioeconomic History  . Marital status: Married    Spouse name: Not on file  . Number of children: Not on file  . Years of education: Not on file  . Highest education level: Not on file  Occupational History  . Not on file  Social Needs  . Financial resource strain: Not on file  . Food insecurity:    Worry: Not on file    Inability: Not on file  . Transportation needs:    Medical: Not on file    Non-medical: Not on file  Tobacco Use  . Smoking status: Never Smoker  . Smokeless tobacco: Never Used  . Tobacco comment: light in the past social smoker quit  Substance and Sexual Activity  . Alcohol use: Yes    Comment: OCC  . Drug use: No  . Sexual activity: Yes  Lifestyle  . Physical activity:    Days per week: Not on file    Minutes per session: Not on file  . Stress: Not on file  Relationships  . Social connections:    Talks on phone: Not on file    Gets together: Not on file    Attends religious service: Not on file    Active member of club or organization: Not on file    Attends meetings of clubs  or organizations: Not on file    Relationship status: Not on file  . Intimate partner violence:    Fear of current or ex partner: Not on file    Emotionally abused: Not on file    Physically abused: Not on file    Forced sexual activity: Not on file  Other Topics Concern  . 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 nabumetone (RELAFEN) 750 MG tablet   . Olopatadine HCl 0.2 % SOLN Place 1 drop into both eyes daily as needed for dry eyes.  . pantoprazole (PROTONIX) 40 MG tablet Take 1 tablet (40 mg total) by mouth every evening. Before dinner  . pantoprazole (PROTONIX) 40 MG tablet Take 1 tablet (40 mg total) by mouth 2  (two) times daily. 30 minutes before food x 14 days   No Known Allergies No results found for this or any previous visit (from the past 2160 hour(s)). Objective  Body mass index is 36.67 kg/m. Wt Readings from Last 3 Encounters:  12/23/17 270 lb 6 oz (122.6 kg)  09/06/17 279 lb 9.6 oz (126.8 kg)  07/15/17 275 lb (124.7 kg)   Temp Readings from Last 3 Encounters:  12/23/17 98.4 F (36.9 C) (Oral)  09/06/17 98.4 F (36.9 C) (Oral)  07/15/17 (!) 97.4 F (36.3 C)   BP Readings from Last 3 Encounters:  12/23/17 108/64  09/06/17 124/70  07/15/17 (!) 133/91   Pulse Readings from Last 3 Encounters:  12/23/17 (!) 106  09/06/17 73  07/15/17 89    Physical Exam  Constitutional: He is oriented to person, place, and time. Vital signs are normal. He appears well-developed and well-nourished. He is cooperative.  HENT:  Head: Normocephalic and atraumatic.  Mouth/Throat: Oropharynx is clear and moist and mucous membranes are normal.  Eyes: Pupils are equal, round, and reactive to light. Conjunctivae are normal.  Cardiovascular: Normal rate, regular rhythm and normal heart sounds.  Pulmonary/Chest: Effort normal and breath sounds normal.  Abdominal:    Neurological: He is alert and oriented to person, place, and time. Gait normal.  Skin: Skin is warm, dry and intact.  Psychiatric: He has a normal mood and affect. His speech is normal and behavior is normal. Judgment and thought content normal. Cognition and memory are normal.  Vitals reviewed.   Assessment   1. GERD, n/v h/o gas and H pylori 2. diasthesis recti  3. Chronic right knee pain s/p arthroscopy and 2 steroid injections  4. HM 5. osa on cpap Plan  1. Reduce protonix to 1x per day  Refer to GI for EGD h/o H pylori w/u appt 01/18/18 2. CT ab w/o contrast  Refer to surgery pt wants surgical repair  3. F/u ortho  4.  UTD flu UTD Tdap  Hep b 3/3 today 5. Tolerating cpsp   Provider: Dr. French Anaracy McLean-Scocuzza-Internal  Medicine

## 2017-12-23 NOTE — Addendum Note (Signed)
Addended by: Alisia FerrariBARE, KRISTEN C on: 12/23/2017 10:31 AM   Modules accepted: Orders

## 2017-12-28 ENCOUNTER — Other Ambulatory Visit: Payer: Self-pay | Admitting: Internal Medicine

## 2017-12-28 DIAGNOSIS — M6208 Separation of muscle (nontraumatic), other site: Secondary | ICD-10-CM

## 2017-12-28 NOTE — Addendum Note (Signed)
Addended by: Quentin OreMCLEAN-SCOCUZZA, TRACY on: 12/28/2017 07:58 PM   Modules accepted: Orders

## 2017-12-29 ENCOUNTER — Encounter (INDEPENDENT_AMBULATORY_CARE_PROVIDER_SITE_OTHER): Payer: Self-pay

## 2018-01-03 ENCOUNTER — Ambulatory Visit
Admission: RE | Admit: 2018-01-03 | Discharge: 2018-01-03 | Disposition: A | Discharge status: Home / Self Care | Payer: BC Managed Care – PPO | Source: Ambulatory Visit | Attending: Internal Medicine | Admitting: Internal Medicine

## 2018-01-03 DIAGNOSIS — K439 Ventral hernia without obstruction or gangrene: Secondary | ICD-10-CM | POA: Insufficient documentation

## 2018-01-03 DIAGNOSIS — R112 Nausea with vomiting, unspecified: Secondary | ICD-10-CM | POA: Diagnosis not present

## 2018-01-03 DIAGNOSIS — M6208 Separation of muscle (nontraumatic), other site: Secondary | ICD-10-CM | POA: Insufficient documentation

## 2018-01-03 DIAGNOSIS — Z905 Acquired absence of kidney: Secondary | ICD-10-CM | POA: Diagnosis not present

## 2018-01-03 MED ORDER — IOPAMIDOL (ISOVUE-370) INJECTION 76%
85.0000 mL | Freq: Once | INTRAVENOUS | Status: AC | PRN
  Administered 2018-01-03: 85 mL via INTRAVENOUS

## 2018-01-03 MED ORDER — IOPAMIDOL (ISOVUE-370) INJECTION 76%: 100.0000 mL | Freq: Once | INTRAVENOUS | Status: DC | PRN

## 2018-01-05 ENCOUNTER — Encounter: Payer: Self-pay | Admitting: Internal Medicine

## 2018-01-05 ENCOUNTER — Other Ambulatory Visit: Payer: Self-pay | Admitting: Internal Medicine

## 2018-01-05 DIAGNOSIS — N5089 Other specified disorders of the male genital organs: Secondary | ICD-10-CM

## 2018-01-05 NOTE — Progress Notes (Signed)
Order placed for urology referral.  

## 2018-01-18 ENCOUNTER — Ambulatory Visit: Payer: BC Managed Care – PPO | Admitting: Gastroenterology

## 2018-01-18 ENCOUNTER — Other Ambulatory Visit: Payer: Self-pay

## 2018-01-18 ENCOUNTER — Encounter: Payer: Self-pay | Admitting: Gastroenterology

## 2018-01-18 VITALS — BP 120/83 | HR 108 | Ht 72.0 in | Wt 279.6 lb

## 2018-01-18 DIAGNOSIS — R1013 Epigastric pain: Secondary | ICD-10-CM

## 2018-01-18 NOTE — Progress Notes (Signed)
[name] MD, MRCP(U.K) [address]  [address]  [place], [place] [address]  Main: [phone]  Fax: [phone]   Gastroenterology Consultation  Referring Provider:     McLean-Scocuzza, French Ana * Primary Care Physician:  McLean-Scocuzza, Pasty Spillers, MD Primary Gastroenterologist:  Dr. [name]  Reason for Consultation:     Nausea, vomiting         HPI:   Joshua Ramirez is a 42 y.o. y/o male referred for consultation & management  by Dr. Judie Grieve, Pasty Spillers, MD.     He has been referred for nausea and vomiting. CT abdomen showed no acute findings but possible mid line hernia.. H pylori serum checked and was positive.   He says that over the last 2-3 years, throws up his food anytime within 20 mins . Can occur upto twice a week. Says he knows its coming up and coughing followed by food coming back up . Takes protonix for GERD. Takes it 30 mins before meals. Most of the occasions he throws up food is at night, before he goes to bed. Can throw up food from the previous meal many hours after. He is not diabetic. Not on any opiods. Does not smoke . No use of marijuana Rar NSAID use. Denies any weight loss .   Says he has a bowel movement daily or every other day - not hard.     Past Medical History:  Diagnosis Date  . Arthritis    knee s/p right knee surgery meniscal tear with bone fragments, ankle (ortho Dr. Rosita Kea)  . GERD (gastroesophageal reflux disease)   . H. pylori infection   . OSA on CPAP   . Sleep apnea 06/2017   PT JUST WAS DX WITH SLEEP APNEA LAST WEEK AND IS WAITING ON HIS CPAP MACHINE    Past Surgical History:  Procedure Laterality Date  . KIDNEY DONATION     2004  . KNEE ARTHROSCOPY WITH MEDIAL MENISECTOMY Right 07/15/2017   Procedure: KNEE ARTHROSCOPY WITH MEDIAL MENISECTOMY;  Surgeon: Kennedy Bucker, MD;  Location: [facility]S;  Service: Orthopedics;  Laterality: Right;  . meniscal tear     right knee Dr. Rosita Kea 2014   . MENISECTOMY Right 07/15/2017   Procedure: MENISECTOMY;  Surgeon: Kennedy Bucker, MD;  Location: [facility] OR[facility];  Service: Orthopedics;  Laterality: Right;    Prior to Admission medications   Medication Sig Start Date End Date Taking? Authorizing Provider  nabumetone (RELAFEN) 750 MG tablet  10/01/17  Yes [provider]  Olopatadine HCl 0.2 % SOLN Place 1 drop into both eyes daily as needed for dry eyes. 06/22/17  Yes [provider]  pantoprazole (PROTONIX) 40 MG tablet Take 1 tablet (40 mg total) by mouth every evening. Before dinner 30 minutes 12/23/17  Yes McLean-Scocuzza, Pasty Spillers, MD    Family History  Problem Relation Age of Onset  . Drug abuse Mother   . Hyperlipidemia Mother   . Hypertension Mother   . Cancer Maternal Aunt        brain, breast, lung ? primary was smoker      Social History   Tobacco Use  . Smoking status: Never Smoker  . Smokeless tobacco: Never Used  . Tobacco comment: light in the past social smoker quit  Substance Use Topics  . Alcohol use: Yes    Comment: OCC  . Drug use: No    Allergies as of 01/18/2018  . (No Known Allergies)    Review of Systems:  All systems reviewed and negative except where noted in HPI.   Physical Exam:  BP 120/83   Pulse (!) 108   Ht 6' (1.829 m)   Wt 279 lb 9.6 oz (126.8 kg)   BMI 37.92 kg/m  No LMP for male patient. Psych:  Alert and cooperative. Normal mood and affect. General:   Alert,  Well-developed, well-nourished, pleasant and cooperative in NAD Head:  Normocephalic and atraumatic. Eyes:  Sclera clear, no icterus.   Conjunctiva pink. Ears:  Normal auditory acuity. Nose:  No deformity, discharge, or lesions. Mouth:  No deformity or lesions,oropharynx pink & moist. Neck:  Supple; no masses or thyromegaly. Lungs:  Respirations even and unlabored.  Clear throughout to auscultation.   No wheezes, crackles, or rhonchi. No acute distress. Heart:  Regular rate and rhythm; no murmurs, clicks, rubs, or gallops. Abdomen:  Normal  bowel sounds.  No bruits.  Soft, non-tender and non-distended without masses, hepatosplenomegaly. Midline ventral hernia- reducible. No guarding or rebound tenderness.    Neurologic:  Alert and oriented x3;  grossly normal neurologically. Skin:  Intact without significant lesions or rashes. No jaundice. Lymph Nodes:  No significant cervical adenopathy. Psych:  Alert and cooperative. Normal mood and affect.  Imaging Studies: Ct Abdomen Pelvis W Contrast  Result Date: 01/03/2018 CLINICAL DATA:  Abdominal pain and swelling. EXAM: CT ABDOMEN AND PELVIS WITH CONTRAST TECHNIQUE: Multidetector CT imaging of the abdomen and pelvis was performed using the standard protocol following bolus administration of intravenous contrast. CONTRAST:  85mL ISOVUE-370 IOPAMIDOL (ISOVUE-370) INJECTION 76% COMPARISON:  None. FINDINGS: Lower chest: Moderate breathing motion artifact but no obvious pulmonary lesions. Moderate eventration of the right hemidiaphragm with overlying atelectasis. The heart is normal in size. No pericardial effusion. Small hiatal hernia noted. Hepatobiliary: No focal hepatic lesions or intrahepatic biliary dilatation. The gallbladder is normal. No common bile duct dilatation. Pancreas: No mass, inflammation or ductal dilatation. Spleen: Normal size.  No focal lesions. Adrenals/Urinary Tract: The adrenal glands are normal. 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 The right kidney demonstrates mild compensatory hypertrophy. No renal lesions or hydronephrosis. No renal or ureteral calculi. No bladder calculi. Stomach/Bowel: The stomach, duodenum, small bowel and colon are unremarkable. No acute inflammatory changes, mass lesions or obstructive findings. The terminal ileum and appendix are normal. Vascular/Lymphatic: The aorta is normal in caliber. No dissection. Minimal scattered atherosclerotic calcifications. The branch vessels are patent. The major venous structures are patent. No  mesenteric or retroperitoneal mass or adenopathy. Small scattered lymph nodes are noted. Reproductive: The prostate gland and seminal vesicles are unremarkable. Partially calcified lesions are noted in both hemiscrotum. These could be the patient's testicles. This could be related to mumps. Recommend clinical correlation. Scrotal ultrasound may be helpful for further evaluation. Other: Bulging anterior abdominal wall with a stretched linea alba but no discrete separation or defect. Musculoskeletal: No significant bony findings. 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 IMPRESSION: 1. Diffuse bulging anterior abdominal wall with a stretched and bulging linea Alba but no discrete defect. 2. No acute abdominal/pelvic findings, mass lesions or adenopathy. 3. Possibly small, partially calcified testicles versus extratesticular calcifications but incompletely imaged. Recommend clinical correlation. Scrotal ultrasound may be helpful for for evaluation. 4. Status post left nephrectomy. Electronically Signed   By: Rudie MeyerP.  Gallerani M.D.   On: 01/03/2018 16:50    Assessment and Plan:   Colette RibasKenyon Peppers is a 42 y.o. y/o male has been referred for nausea and vomiting. Will r/o H pylori  gastritis. He has a fairly large ventral hernia on examination which I suspect may be contributing to his symptoms.    Plan  1. Stool H pylori antigen  2. EGD 3. Protonix increase to BID.  4. Refer to surgery for ventral hernia with symptoms.   I have discussed alternative options, risks & benefits,  which include, but are not limited to, bleeding, infection, perforation,respiratory complication & drug reaction.  The patient agrees with this plan & written consent will be obtained.    Follow up in 4 weeks   Dr [name] MD,MRCP(U.K)

## 2018-01-25 ENCOUNTER — Encounter: Payer: Self-pay | Admitting: *Deleted

## 2018-01-26 ENCOUNTER — Ambulatory Visit: Payer: BC Managed Care – PPO | Admitting: Certified Registered Nurse Anesthetist

## 2018-01-26 ENCOUNTER — Encounter: Payer: Self-pay | Admitting: Student

## 2018-01-26 ENCOUNTER — Other Ambulatory Visit: Payer: Self-pay

## 2018-01-26 ENCOUNTER — Encounter
Admission: RE | Disposition: A | Discharge status: Home / Self Care | Payer: Self-pay | Source: Ambulatory Visit | Attending: Gastroenterology

## 2018-01-26 ENCOUNTER — Ambulatory Visit
Admission: RE | Admit: 2018-01-26 | Discharge: 2018-01-26 | Disposition: A | Discharge status: Home / Self Care | Payer: BC Managed Care – PPO | Source: Ambulatory Visit | Attending: Gastroenterology | Admitting: Gastroenterology

## 2018-01-26 DIAGNOSIS — Z905 Acquired absence of kidney: Secondary | ICD-10-CM | POA: Diagnosis not present

## 2018-01-26 DIAGNOSIS — K219 Gastro-esophageal reflux disease without esophagitis: Secondary | ICD-10-CM | POA: Diagnosis not present

## 2018-01-26 DIAGNOSIS — Z9889 Other specified postprocedural states: Secondary | ICD-10-CM | POA: Diagnosis not present

## 2018-01-26 DIAGNOSIS — K449 Diaphragmatic hernia without obstruction or gangrene: Secondary | ICD-10-CM | POA: Insufficient documentation

## 2018-01-26 DIAGNOSIS — G4733 Obstructive sleep apnea (adult) (pediatric): Secondary | ICD-10-CM | POA: Insufficient documentation

## 2018-01-26 DIAGNOSIS — R1013 Epigastric pain: Secondary | ICD-10-CM

## 2018-01-26 DIAGNOSIS — Z8249 Family history of ischemic heart disease and other diseases of the circulatory system: Secondary | ICD-10-CM | POA: Diagnosis not present

## 2018-01-26 DIAGNOSIS — Z79899 Other long term (current) drug therapy: Secondary | ICD-10-CM | POA: Diagnosis not present

## 2018-01-26 DIAGNOSIS — K2950 Unspecified chronic gastritis without bleeding: Secondary | ICD-10-CM | POA: Diagnosis not present

## 2018-01-26 DIAGNOSIS — R112 Nausea with vomiting, unspecified: Secondary | ICD-10-CM | POA: Diagnosis present

## 2018-01-26 HISTORY — PX: ESOPHAGOGASTRODUODENOSCOPY (EGD) WITH PROPOFOL: SHX5813

## 2018-01-26 SURGERY — ESOPHAGOGASTRODUODENOSCOPY (EGD) WITH PROPOFOL
Anesthesia: General

## 2018-01-26 MED ORDER — SODIUM CHLORIDE 0.9 % IV SOLN
INTRAVENOUS | Status: DC
  Administered 2018-01-26: 13:00:00 via INTRAVENOUS

## 2018-01-26 MED ORDER — PROPOFOL 10 MG/ML IV BOLUS
INTRAVENOUS | Status: AC
  Filled 2018-01-26: qty 20

## 2018-01-26 MED ORDER — PROPOFOL 500 MG/50ML IV EMUL
INTRAVENOUS | Status: DC | PRN
  Administered 2018-01-26: 175 ug/kg/min via INTRAVENOUS

## 2018-01-26 MED ORDER — MIDAZOLAM HCL 5 MG/5ML IJ SOLN
INTRAMUSCULAR | Status: DC | PRN
  Administered 2018-01-26: 2 mg via INTRAVENOUS

## 2018-01-26 MED ORDER — MIDAZOLAM HCL 2 MG/2ML IJ SOLN
INTRAMUSCULAR | Status: AC
  Filled 2018-01-26: qty 2

## 2018-01-26 MED ORDER — FENTANYL CITRATE (PF) 100 MCG/2ML IJ SOLN
INTRAMUSCULAR | Status: DC | PRN
  Administered 2018-01-26 (×2): 25 ug via INTRAVENOUS

## 2018-01-26 MED ORDER — FENTANYL CITRATE (PF) 100 MCG/2ML IJ SOLN
INTRAMUSCULAR | Status: AC
  Filled 2018-01-26: qty 2

## 2018-01-26 MED ORDER — LIDOCAINE HCL (CARDIAC) PF 100 MG/5ML IV SOSY
PREFILLED_SYRINGE | INTRAVENOUS | Status: DC | PRN
  Administered 2018-01-26: 40 mg via INTRAVENOUS

## 2018-01-26 MED ORDER — GLYCOPYRROLATE 0.2 MG/ML IJ SOLN
INTRAMUSCULAR | Status: DC | PRN
  Administered 2018-01-26: 0.2 mg via INTRAVENOUS

## 2018-01-26 MED ORDER — PROPOFOL 10 MG/ML IV BOLUS
INTRAVENOUS | Status: DC | PRN
  Administered 2018-01-26: 100 mg via INTRAVENOUS

## 2018-01-26 NOTE — Anesthesia Post-op Follow-up Note (Signed)
Anesthesia QCDR form completed.        

## 2018-01-26 NOTE — Op Note (Signed)
[facility] Gastroenterology Patient Name: Joshua Ramirez Procedure Date: 01/26/2018 1:16 PM MRN: [id number] Account #: [id number] Date of Birth: 12-18-1975 Admit Type: Outpatient Age: 42 Room: [facility] ENDO ROOM 3 Gender: Male Note Status: Finalized Procedure:            Upper GI endoscopy Indications:          Nausea with vomiting Providers:            [name] MD, MD Referring MD:         Pasty Spillers Mclean-Scocuzza MD, MD (Referring MD) Medicines:            Monitored Anesthesia Care Complications:        No immediate complications. Procedure:            Pre-Anesthesia Assessment:                       - Prior to the procedure, a History and Physical was                        performed, and patient medications, allergies and                        sensitivities were reviewed. The patient's tolerance of                        previous anesthesia was reviewed.                       - The risks and benefits of the procedure and the                        sedation options and risks were discussed with the                        patient. All questions were answered and informed                        consent was obtained.                       - ASA Grade Assessment: II - A patient with mild                        systemic disease.                       After obtaining informed consent, the endoscope was                        passed under direct vision. Throughout the procedure,                        the patient's blood pressure, pulse, and oxygen                        saturations were monitored continuously. The Endoscope                        was introduced through the mouth, and advanced to the  third part of duodenum. The upper GI endoscopy was                        accomplished with ease. The patient tolerated the                        procedure well. Findings:      The stomach was normal.      A small hiatal hernia was present.  Patchy mild inflammation characterized by congestion (edema) and       erythema was found in the gastric antrum and in the stomach. Biopsies       were taken with a cold forceps for histology.      The cardia and gastric fundus were normal on retroflexion.      The examined duodenum was normal. Impression:           - Normal stomach.                       - Small hiatal hernia.                       - Gastritis. Biopsied.                       - Normal examined duodenum. Recommendation:       - Await pathology results.                       - Discharge patient to home (with escort).                       - Resume previous diet.                       - Continue present medications.                       - Await pathology results.                       - Return to my office as previously scheduled. Procedure Code(s):    --- Professional ---                       [id number], Esophagogastroduodenoscopy, flexible, transoral;                        with biopsy, single or multiple Diagnosis Code(s):    --- Professional ---                       K44.9, Diaphragmatic hernia without obstruction or                        gangrene                       K29.70, Gastritis, unspecified, without bleeding                       R11.2, Nausea with vomiting, unspecified CPT copyright 2017 American Medical Association. All rights reserved. The codes documented in this report are preliminary and upon coder review may  be revised to meet current compliance requirements. [name], MD [name]  MD, MD 01/26/2018 1:33:46 PM This report has been signed electronically. Number of Addenda: 0 Note Initiated On: 01/26/2018 1:16 PM      [facility]

## 2018-01-26 NOTE — Anesthesia Procedure Notes (Signed)
Date/Time: 01/26/2018 1:20 PM Performed by: Stormy Fabian, CRNA Pre-anesthesia Checklist: Patient identified, Emergency Drugs available, Suction available and Patient being monitored Patient Re-evaluated:Patient Re-evaluated prior to induction Oxygen Delivery Method: Nasal cannula Induction Type: IV induction Dental Injury: Teeth and Oropharynx as per pre-operative assessment  Comments: Nasal cannula with etCO2 monitoring

## 2018-01-26 NOTE — Transfer of Care (Signed)
Immediate Anesthesia Transfer of Care Note  Patient: Joshua Ramirez  Procedure(s) Performed: Procedure(s): ESOPHAGOGASTRODUODENOSCOPY (EGD) WITH PROPOFOL (N/A)  Patient Location: PACU and Endoscopy Unit  Anesthesia Type:General  Level of Consciousness: sedated  Airway & Oxygen Therapy: Patient Spontanous Breathing and Patient connected to nasal cannula oxygen  Post-op Assessment: Report given to RN and Post -op Vital signs reviewed and stable  Post vital signs: Reviewed and stable  Last Vitals:  Vitals:   01/26/18 1229 01/26/18 1340  BP: 104/80 (!) 136/98  Pulse: 86 (!) 114  Resp:  14  Temp: 36.7 C 37.1 C  SpO2: 96% 97%    Complications: No apparent anesthesia complications

## 2018-01-26 NOTE — Anesthesia Preprocedure Evaluation (Addendum)
Anesthesia Evaluation  Patient identified by MRN, date of birth, ID band Patient awake    Reviewed: Allergy & Precautions, H&P , NPO status , Patient's Chart, lab work & pertinent test results  Airway Mallampati: III  TM Distance: >3 FB Neck ROM: full    Dental  (+) Teeth Intact   Pulmonary neg pulmonary ROS, sleep apnea ,    breath sounds clear to auscultation       Cardiovascular negative cardio ROS   Rhythm:regular Rate:Normal     Neuro/Psych negative neurological ROS  negative psych ROS   GI/Hepatic negative GI ROS, Neg liver ROS, hiatal hernia, GERD  ,  Endo/Other  negative endocrine ROS  Renal/GU negative Renal ROS  negative genitourinary   Musculoskeletal  (+) Arthritis ,   Abdominal   Peds  Hematology negative hematology ROS (+)   Anesthesia Other Findings Past Medical History: No date: Arthritis     Comment:  knee s/p right knee surgery meniscal tear with bone               fragments, ankle (ortho Dr. Rosita Kea) No date: GERD (gastroesophageal reflux disease) No date: H. 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 Rosita Kea 2014  07/15/2017: MENISECTOMY; Right     Comment:  Procedure: MENISECTOMY;  Surgeon: Kennedy Bucker, MD;                Location: [facility] ORS;  Service: Orthopedics;  Laterality:               Right;  BMI    Body Mass Index:  37.57 kg/m      Reproductive/Obstetrics negative OB ROS                            Anesthesia Physical Anesthesia Plan  ASA: II  Anesthesia Plan: General   Post-op Pain Management:    Induction:   PONV Risk Score and Plan: Propofol infusion and TIVA  Airway Management Planned: Natural Airway and Nasal Cannula  Additional Equipment:   Intra-op Plan:   Post-operative Plan:   Informed Consent: I have reviewed the patients History and Physical, chart, labs and discussed the procedure including the risks, benefits and alternatives for the proposed anesthesia with the patient or authorized representative who has indicated his/her understanding and acceptance.   Dental Advisory Given  Plan Discussed with: Anesthesiologist, CRNA and Surgeon  Anesthesia Plan Comments:         Anesthesia Quick Evaluation

## 2018-01-26 NOTE — H&P (Signed)
[name], MD [address], [place], [place], [address] [address], [address], [place], [place], [address] Phone: [phone]  Fax: [phone]  Primary Care Physician:  McLean-Scocuzza, Pasty Spillers, MD   Pre-Procedure History & Physical: HPI:  Joshua Ramirez is a 42 y.o. male is here for an endoscopy    Past Medical History:  Diagnosis Date  . Arthritis    knee s/p right knee surgery meniscal tear with bone fragments, ankle (ortho Dr. Rosita Kea)  . GERD (gastroesophageal reflux disease)   . H. pylori infection   . OSA on CPAP   . Sleep apnea 06/2017   PT JUST WAS DX WITH SLEEP APNEA LAST WEEK AND IS WAITING ON HIS CPAP MACHINE    Past Surgical History:  Procedure Laterality Date  . KIDNEY DONATION     2004  . KNEE ARTHROSCOPY WITH MEDIAL MENISECTOMY Right 07/15/2017   Procedure: KNEE ARTHROSCOPY WITH MEDIAL MENISECTOMY;  Surgeon: Kennedy Bucker, MD;  Location: [facility] ORS;  Service: Orthopedics;  Laterality: Right;  . meniscal tear     right knee Dr. Rosita Kea 2014   . MENISECTOMY Right 07/15/2017   Procedure: MENISECTOMY;  Surgeon: Kennedy Bucker, MD;  Location: [facility] ORS;  Service: Orthopedics;  Laterality: Right;    Prior to Admission medications   Medication Sig Start Date End Date Taking? Authorizing Provider  nabumetone (RELAFEN) 750 MG tablet  10/01/17  Yes [provider]  pantoprazole (PROTONIX) 40 MG tablet Take 1 tablet (40 mg total) by mouth every evening. Before dinner 30 minutes 12/23/17  Yes McLean-Scocuzza, Pasty Spillers, MD  Olopatadine HCl 0.2 % SOLN Place 1 drop into both eyes daily as needed for dry eyes. 06/22/17   [provider]    Allergies as of 01/19/2018  . (No Known Allergies)    Family History  Problem Relation Age of Onset  . Drug abuse Mother   . Hyperlipidemia Mother   . Hypertension Mother   . Cancer Maternal Aunt        brain, breast, lung ? primary was smoker     Social History   Socioeconomic History  .  Marital status: Married    Spouse name: Not on file  . Number of children: Not on file  . Years of education: Not on file  . Highest education level: Not on file  Occupational History  . Not on file  Social Needs  . Financial resource strain: Not on file  . Food insecurity:    Worry: Not on file    Inability: Not on file  . Transportation needs:    Medical: Not on file    Non-medical: Not on file  Tobacco Use  . Smoking status: Never Smoker  . Smokeless tobacco: Never Used  . Tobacco comment: light in the past social smoker quit  Substance and Sexual Activity  . Alcohol use: Yes    Alcohol/week: 1.0 - 2.0 standard drinks    Types: 1 - 2 Shots of liquor per week  . Drug use: No  . Sexual activity: Yes  Lifestyle  . Physical activity:    Days per week: Not on file    Minutes per session: Not on file  . Stress: Not on file  Relationships  . Social connections:    Talks on phone: Not on file    Gets together: Not on file    Attends religious service: Not on file    Active member  of club or organization: Not on file    Attends meetings of clubs or organizations: Not on file    Relationship status: Not on file  . Intimate partner violence:    Fear of current or ex partner: Not on file    Emotionally abused: Not on file    Physically abused: Not on file    Forced sexual activity: Not on file  Other Topics Concern  . Not on file  Social History Narrative   2 year college    Works [employer], coaches girls basketball, football, baseball at [employer]   From [place]    Review of Systems: See HPI, otherwise negative ROS  Physical Exam: BP 104/80   Pulse 86   Temp 98.1 F (36.7 C) (Tympanic)   Ht 6' (1.829 m)   Wt 125.6 kg   SpO2 96%   BMI 37.57 kg/m  General:   Alert,  pleasant and cooperative in NAD Head:  Normocephalic and atraumatic. Neck:  Supple; no masses or thyromegaly. Lungs:  Clear throughout to auscultation, normal  respiratory effort.    Heart:  +S1, +S2, Regular rate and rhythm, No edema. Abdomen:  Soft, nontender and nondistended. Normal bowel sounds, without guarding, and without rebound.   Neurologic:  Alert and  oriented x4;  grossly normal neurologically.  Impression/Plan: Lavi Nitka is here for an endoscopy  to be performed for  evaluation of nausea and vomiting    Risks, benefits, limitations, and alternatives regarding endoscopy have been reviewed with the patient.  Questions have been answered.  All parties agreeable.   [name], MD  01/26/2018, 1:13 PM

## 2018-01-27 ENCOUNTER — Encounter: Payer: Self-pay | Admitting: Gastroenterology

## 2018-01-27 LAB — SURGICAL PATHOLOGY

## 2018-01-27 NOTE — Anesthesia Postprocedure Evaluation (Signed)
Anesthesia Post Note  Patient: Joshua Ramirez  Procedure(s) Performed: ESOPHAGOGASTRODUODENOSCOPY (EGD) WITH PROPOFOL (N/A )  Patient location during evaluation: PACU Anesthesia Type: General Level of consciousness: awake and alert Pain management: pain level controlled Vital Signs Assessment: post-procedure vital signs reviewed and stable Respiratory status: spontaneous breathing, nonlabored ventilation and respiratory function stable Cardiovascular status: blood pressure returned to baseline and stable Postop Assessment: no apparent nausea or vomiting Anesthetic complications: no     Last Vitals:  Vitals:   01/26/18 1350 01/26/18 1400  BP: (!) 140/94 (!) 130/99  Pulse:  85  Resp:  16  Temp:    SpO2:  95%    Last Pain:  Vitals:   01/26/18 1350  TempSrc:   PainSc: 0-No pain                 Jovita Gamma

## 2018-01-28 ENCOUNTER — Telehealth: Payer: Self-pay | Admitting: Gastroenterology

## 2018-01-28 NOTE — Telephone Encounter (Signed)
FYI.. Pt wanted you to know he has an appt with Dr. Everlene Farrier on 02/02/18 for consultation for hernia repair.

## 2018-01-28 NOTE — Telephone Encounter (Signed)
Pt left vm he states Dr. Tobi Bastos told him to call about a surgery for his Hernia he has

## 2018-01-30 ENCOUNTER — Encounter: Payer: Self-pay | Admitting: Gastroenterology

## 2018-02-01 ENCOUNTER — Ambulatory Visit: Payer: BC Managed Care – PPO | Admitting: Urology

## 2018-02-01 ENCOUNTER — Encounter: Payer: Self-pay | Admitting: Urology

## 2018-02-01 VITALS — BP 110/70 | HR 80 | Ht 72.0 in | Wt 270.0 lb

## 2018-02-01 DIAGNOSIS — N5089 Other specified disorders of the male genital organs: Secondary | ICD-10-CM | POA: Diagnosis not present

## 2018-02-01 LAB — MICROSCOPIC EXAMINATION

## 2018-02-01 LAB — URINALYSIS, COMPLETE
BILIRUBIN UA: NEGATIVE
Glucose, UA: NEGATIVE
KETONES UA: NEGATIVE
Nitrite, UA: NEGATIVE
PROTEIN UA: NEGATIVE
RBC UA: NEGATIVE
SPEC GRAV UA: 1.02 (ref 1.005–1.030)
Urobilinogen, Ur: 1 mg/dL (ref 0.2–1.0)
pH, UA: 5.5 (ref 5.0–7.5)

## 2018-02-01 MED ORDER — TADALAFIL 5 MG PO TABS: 5.0000 mg | ORAL_TABLET | Freq: Every day | ORAL | 11 refills | Status: DC | PRN

## 2018-02-01 NOTE — Progress Notes (Signed)
02/01/2018 12:50 PM   Joshua Ramirez 1976-05-21 [id number]  Referring provider: McLean-Scocuzza, Pasty Spillers, MD [address] [place], [place] [address]  CC: Testicular calcifications  HPI: I had the pleasure of seeing Joshua Ramirez today in urology clinic for consultation on testicular calcifications seen on CT from Dr. Lorin Picket.  He is a 42 year old African-American male who had a CT scan for a hiatal hernia that showed an incidental finding of bilateral testicular calcifications.  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  He denies family history of any urologic malignancies including testicular prostate cancer.  He has 1 daughter, he and his wife are not interested in having further children.  He denies any urinary symptoms or gross hematuria.  He does report mild to moderate erectile dysfunction, with some difficulty achieving and maintaining an erection some of the time.  Is never tried any medications for this.   PMH: Past Medical History:  Diagnosis Date  . Arthritis    knee s/p right knee surgery meniscal tear with bone fragments, ankle (ortho Dr. Rosita Kea)  . GERD (gastroesophageal reflux disease)   . H. pylori infection   . OSA on CPAP   . Sleep apnea 06/2017   PT JUST WAS DX WITH SLEEP APNEA LAST WEEK AND IS WAITING ON HIS CPAP MACHINE    Surgical History: Past Surgical History:  Procedure Laterality Date  . ESOPHAGOGASTRODUODENOSCOPY (EGD) WITH PROPOFOL N/A 01/26/2018   Procedure: ESOPHAGOGASTRODUODENOSCOPY (EGD) WITH PROPOFOL;  Surgeon: [name], MD;  Location: [facility] ENDOSCOPY;  Service: Gastroenterology;  Laterality: N/A;  . KIDNEY DONATION     2004  . KNEE ARTHROSCOPY WITH MEDIAL MENISECTOMY Right 07/15/2017   Procedure: KNEE ARTHROSCOPY WITH MEDIAL MENISECTOMY;  Surgeon: Kennedy Bucker, MD;  Location: [facility] ORS;  Service: Orthopedics;  Laterality: Right;  . meniscal tear     right knee Dr. Rosita Kea 2014   . MENISECTOMY Right 07/15/2017   Procedure: MENISECTOMY;  Surgeon: Kennedy Bucker, MD;  Location: [facility] ORS;  Service: Orthopedics;  Laterality: Right;    Allergies: No Known Allergies  Family History: Family History  Problem Relation Age of Onset  . Drug abuse Mother   . Hyperlipidemia Mother   . Hypertension Mother   . Cancer Maternal Aunt        brain, breast, lung ? primary was smoker   . Prostate cancer Neg Hx   . Bladder Cancer Neg Hx   . Kidney cancer Neg Hx     Social History:  reports that he has never smoked. He has never used smokeless tobacco. He reports that he drinks about 1.0 - 2.0 standard drinks of alcohol per week. He reports that he does not use drugs.  ROS: Please see flowsheet from today's date for complete review of systems.  Physical Exam: BP 110/70 (BP Location: Left Arm, Patient Position: Sitting, Cuff Size: Normal)   Pulse 80   Ht 6' (1.829 m)   Wt 270 lb (122.5 kg)   BMI 36.62 kg/m    Constitutional:  Alert and oriented, No acute distress. Cardiovascular: No clubbing, cyanosis, or edema. Respiratory: Normal respiratory effort, no increased work of breathing. GI: Abdomen is soft, nontender, nondistended, no abdominal masses GU: No CVA tenderness, phallus without lesions, widely patent meatus.  Testicles approximately 15 cc bilaterally no masses noted. Lymph: No cervical or inguinal lymphadenopathy. Skin: No rashes, bruises or suspicious lesions. Neurologic: Grossly intact, no focal deficits, moving all 4 extremities. Psychiatric: Normal mood and affect.  Pertinent Imaging: I have personally reviewed the CT scan with contrast 01/03/2018: Testicular calcifications of unclear etiology bilaterally.  Solitary right kidney.  Assessment & Plan:   In summary, Joshua Ramirez is a 42 year old African-American male with incidental finding of bilateral testicular calcifications on CT scan, as well as erectile dysfunction.  Regarding his erectile dysfunction he would like to trial Cialis.  We  discussed this medication and side effects in detail, prescription was given.   Regarding his testicular calcifications, his exam today is benign.  I recommended a scrotal ultrasound to rule out any intratesticular masses.  He will get this done in the next 1 to 2 days and I will call him with results.  Return in about 1 year (around 02/02/2019) for RTC.  Sondra Come, MD  [facility] [address] [place], [place] [address] [phone]

## 2018-02-02 ENCOUNTER — Ambulatory Visit (INDEPENDENT_AMBULATORY_CARE_PROVIDER_SITE_OTHER): Payer: BC Managed Care – PPO | Admitting: Surgery

## 2018-02-02 ENCOUNTER — Ambulatory Visit
Admission: RE | Admit: 2018-02-02 | Discharge: 2018-02-02 | Disposition: A | Discharge status: Home / Self Care | Payer: BC Managed Care – PPO | Source: Ambulatory Visit | Attending: Urology | Admitting: Urology

## 2018-02-02 ENCOUNTER — Encounter: Payer: Self-pay | Admitting: Surgery

## 2018-02-02 VITALS — BP 124/87 | HR 103 | Temp 97.5°F | Ht 72.0 in | Wt 279.0 lb

## 2018-02-02 DIAGNOSIS — N5089 Other specified disorders of the male genital organs: Secondary | ICD-10-CM | POA: Insufficient documentation

## 2018-02-02 DIAGNOSIS — K219 Gastro-esophageal reflux disease without esophagitis: Secondary | ICD-10-CM | POA: Diagnosis not present

## 2018-02-02 NOTE — Patient Instructions (Signed)
Barium Swallow A barium swallow is an X-ray test. It is used to check your throat and the tube that carries food from your mouth to your stomach (esophagus). For this test, you will drink a white liquid called barium. The liquid shows up well on X-rays and helps your doctor see problems. What happens before the procedure?  Follow your doctor's instructions about limiting what you eat or drink.  Ask your doctor about changing or stopping your normal medicines. This is important if you take diabetes medicines or blood thinners. What happens during the procedure?  You will be positioned on an X-ray table.  You will drink the liquid barium. This liquid looks like a milkshake.  The X-ray table may be moved so you are more upright. You may also need to change your position on the table.  X-ray pictures will be shown on a monitor. This allows the doctor to watch the barium as it passes through your body. The procedure may vary among doctors and hospitals. What happens after the procedure?  Return to your normal activities and your normal diet as told by your doctor.  Your poop (feces) may be white or gray for 2-3 days. You may be given a medicine that helps you poop (laxative). This helps the barium leave your body.  Your doctor may also tell you to: ? Drink enough fluid to keep your pee (urine) clear or pale yellow. ? Eat foods with a lot of fiber in them. These include fruits, vegetables, whole grains, and beans.  Call your doctor if: ? You have trouble pooping (constipation). ? You cannot poop or pass gas. ? You have pain or swelling in your belly (abdomen). ? You have a fever.  Ask your doctor when and how you will get your test results. This information is not intended to replace advice given to you by your health care provider. Make sure you discuss any questions you have with your health care provider. Document Released: 06/13/2010 Document Revised: 10/17/2015 Document Reviewed:  02/20/2014 Elsevier Interactive Patient Education  [employer].

## 2018-02-03 ENCOUNTER — Telehealth: Payer: Self-pay | Admitting: Family Medicine

## 2018-02-03 NOTE — Telephone Encounter (Signed)
Patient notified and voiced understanding.

## 2018-02-03 NOTE — Telephone Encounter (Signed)
-----   Message from Sondra ComeBrian C Sninsky, MD sent at 02/03/2018  8:36 AM EDT ----- Regarding: normal ultrasound Please let him know his testicular ultrasound was normal. No tumors or concerning findings. There are some calcifications, but these are benign and a variation of normal.  Sondra ComeBrian C Sninsky, MD 02/03/2018

## 2018-02-04 ENCOUNTER — Ambulatory Visit
Admission: RE | Admit: 2018-02-04 | Discharge: 2018-02-04 | Disposition: A | Discharge status: Home / Self Care | Payer: BC Managed Care – PPO | Source: Ambulatory Visit | Attending: Surgery | Admitting: Surgery

## 2018-02-04 ENCOUNTER — Encounter: Payer: Self-pay | Admitting: Surgery

## 2018-02-04 DIAGNOSIS — K219 Gastro-esophageal reflux disease without esophagitis: Secondary | ICD-10-CM | POA: Diagnosis not present

## 2018-02-04 DIAGNOSIS — K449 Diaphragmatic hernia without obstruction or gangrene: Secondary | ICD-10-CM | POA: Diagnosis not present

## 2018-02-04 NOTE — Progress Notes (Signed)
Patient ID: Aziz Slape, male   DOB: [birth year], 42 y.o.   MRN: [id number]  HPI Jlynn Langille is a 42 y.o. male seen in consultation at the request of Dr. Tobi Bastos for diastases recti and umbilical hernia.  He also reports that he is got significant reflux symptoms.  He had a recent EGD personally reviewed showing evidence of a hiatal hernia and some gastritis. So had a CT scan showing evidence of diastases recti.  Of note he had a previous left donor nephrectomy several years ago.  He is able to perform more than 4 METS of activity without any shortness of breath chest pain.  He does report having significant burning sensation within his chest worsening after he lays down.  He does report also some regurgitation of undigested meals.  There is only mild improvement after he was started on a PPI.  He also reports some epigastric pain that is intermittent moderate in intensity and there is no specific alleviating or aggravating factors. There is no evidence of dysphagia.  HPI  Past Medical History:  Diagnosis Date  . Arthritis    knee s/p right knee surgery meniscal tear with bone fragments, ankle (ortho Dr. Rosita Kea)  . GERD (gastroesophageal reflux disease)   . H. pylori infection   . OSA on CPAP   . Sleep apnea 06/2017   PT JUST WAS DX WITH SLEEP APNEA LAST WEEK AND IS WAITING ON HIS CPAP MACHINE    Past Surgical History:  Procedure Laterality Date  . ESOPHAGOGASTRODUODENOSCOPY (EGD) WITH PROPOFOL N/A 01/26/2018   Procedure: ESOPHAGOGASTRODUODENOSCOPY (EGD) WITH PROPOFOL;  Surgeon: [name], MD;  Location: [facility] ENDOSCOPY;  Service: Gastroenterology;  Laterality: N/A;  . KIDNEY DONATION     2004  . KNEE ARTHROSCOPY WITH MEDIAL MENISECTOMY Right 07/15/2017   Procedure: KNEE ARTHROSCOPY WITH MEDIAL MENISECTOMY;  Surgeon: Kennedy Bucker, MD;  Location: [facility] ORS;  Service: Orthopedics;  Laterality: Right;  . meniscal tear     right knee Dr. Rosita Kea 2014   . MENISECTOMY Right 07/15/2017   Procedure:  MENISECTOMY;  Surgeon: Kennedy Bucker, MD;  Location: [facility] ORS;  Service: Orthopedics;  Laterality: Right;    Family History  Problem Relation Age of Onset  . Drug abuse Mother   . Hyperlipidemia Mother   . Hypertension Mother   . Cancer Maternal Aunt        brain, breast, lung ? primary was smoker   . Prostate cancer Neg Hx   . Bladder Cancer Neg Hx   . Kidney cancer Neg Hx     Social History Social History   Tobacco Use  . Smoking status: Never Smoker  . Smokeless tobacco: Never Used  . Tobacco comment: light in the past social smoker quit  Substance Use Topics  . Alcohol use: Yes    Alcohol/week: 1.0 - 2.0 standard drinks    Types: 1 - 2 Shots of liquor per week  . Drug use: No    No Known Allergies  Current Outpatient Medications  Medication Sig Dispense Refill  . nabumetone (RELAFEN) 750 MG tablet   11  . Olopatadine HCl 0.2 % SOLN Place 1 drop into both eyes daily as needed for dry eyes.  3  . pantoprazole (PROTONIX) 40 MG tablet Take 1 tablet (40 mg total) by mouth every evening. Before dinner 30 minutes 90 tablet 3  . tadalafil (CIALIS) 5 MG tablet Take 1 tablet (5 mg total) by mouth daily as needed for erectile dysfunction. 30 tablet 11  No current facility-administered medications for this visit.      Review of Systems Full ROS  was asked and was negative except for the information on the HPI  Physical Exam Blood pressure 124/87, pulse (!) 103, temperature (!) 97.5 F (36.4 C), temperature source Skin, height 6' (1.829 m), weight 126.6 kg. CONSTITUTIONAL: NAD  EYES: Pupils are equal, round, and reactive to light, Sclera are non-icteric. EARS, NOSE, MOUTH AND THROAT: The oropharynx is clear. The oral mucosa is pink and moist. Hearing is intact to voice. LYMPH NODES:  Lymph nodes in the neck are normal. RESPIRATORY:  Lungs are clear. There is normal respiratory effort, with equal breath sounds bilaterally, and without pathologic use of accessory  muscles. CARDIOVASCULAR: Heart is regular without murmurs, gallops, or rubs. GI: The abdomen is soft, supple umbilical hernia and diastases recti.  There is also mild tenderness to palpation epigastric area.  No peritonitis GU: Rectal deferred.   MUSCULOSKELETAL: Normal muscle strength and tone. No cyanosis or edema.   SKIN: Turgor is good and there are no pathologic skin lesions or ulcers. NEUROLOGIC: Motor and sensation is grossly normal. Cranial nerves are grossly intact. PSYCH:  Oriented to person, place and time. Affect is normal.  Data Reviewed  I have personally reviewed the patient's imaging, laboratory findings and medical records.    Assessment/Plan 42 year old male with recalcitrant reflux.  He is interested in antireflux procedure.  I would like to obtain a barium swallow.  I do think that he will be a good candidate for Nissen fundoplication with hiatal hernia repair.  As far as diastases I explained to him that this is more it mainly cosmetic but we will definitely address his umbilical hernia.     Sterling Bigiego Cinde Ebert, MD FACS General Surgeon 02/04/2018, 4:52 PM

## 2018-02-07 ENCOUNTER — Encounter: Payer: Self-pay | Admitting: Surgery

## 2018-02-07 ENCOUNTER — Ambulatory Visit: Payer: BC Managed Care – PPO | Admitting: Surgery

## 2018-02-07 VITALS — BP 128/76 | HR 91 | Temp 97.9°F | Ht 72.0 in | Wt 275.0 lb

## 2018-02-07 DIAGNOSIS — K2100 Gastro-esophageal reflux disease with esophagitis, without bleeding: Secondary | ICD-10-CM

## 2018-02-07 NOTE — Patient Instructions (Addendum)
We have spoken today about repairing your Hiatal Hernia. Your surgery has been scheduled for _____________________ with Dr. Everlene Farrier.  Plan to be in the hospital for 2-3 days if the minimally invasive surgery is completed without having to make a bigger incision. If the bigger incision is made, you will most likely need to be in the hospital 7-10 days. You will be on a liquid diet and need to recover for 2 weeks following your surgery prior to doing any of your normal activities. At the 2 week mark, we will see you in the office and if you are doing ok we will advance your diet and activity level as you tolerate.  Please see your Blue (Pre-care) Sheet for more information regarding your surgery.  Please call our office with any questions or concerns that you have regarding your surgery and recovery.     Laparoscopic Nissen Fundoplication Laparoscopic Nissen fundoplication is surgery to relieve heartburn and other problems caused by gastric fluids flowing up into your esophagus. The esophagus is the tube that carries food and liquid from your throat to your stomach. Normally, the muscle that sits between your stomach and your esophagus (lower esophageal sphincter or LES) keeps stomach fluids in your stomach. In some people, the LES does not work properly, and stomach fluids flow up into the esophagus. This can happen when part of the stomach bulges through the LES (hiatal hernia). The backward flow of stomach fluids can cause a type of severe and long-standing heartburn that is called gastroesophageal reflux disease (GERD). You may need this surgery if other treatments for GERD have not helped. In a laparoscopic Nissen fundoplication, the upper part of your stomach is wrapped around the lower part of your esophagus to strengthen the LES and prevent reflux. If you have a hiatal hernia, it will also be repaired with this surgery. The procedure is done through several small incisions in your abdomen. It is  performed using a thin, telescopic instrument (laparoscope) and other instruments that can pass through the scope or through other small incisions. Tell a health care provider about:  Any allergies you have.  All medicines you are taking, including vitamins, herbs, eye drops, creams, and over-the-counter medicines.  Any problems you or family members have had with anesthetic medicines.  Any blood disorders you have.  Any surgeries you have had.  Any medical conditions you have. What are the risks? Generally, this is a safe procedure. However, problems may occur, including:  Difficulty swallowing (dysphagia).  Bloating.  Nausea or vomiting.  Damage to the lung, causing a collapsed lung.  Infection or bleeding. What happens before the procedure?  Ask your health care provider about:  Changing or stopping your regular medicines. This is especially important if you are taking diabetes medicines or blood thinners.  Taking medicines such as aspirin and ibuprofen. These medicines can thin your blood. Do not take these medicines before your procedure if your health care provider asks you not to.  Follow your health care provider's instructions about eating or drinking restrictions.  Plan to have someone take you home after the procedure. What happens during the procedure?  An IV tube will be inserted into one of your veins. It will be used to give you fluids and medicines during the procedure.  You will be given a medicine that makes you fall asleep (general anesthetic).  Your abdomen will be cleaned with a germ-killing solution (antiseptic).  The surgeon will make a small incision in your abdomen and  insert a tube through the incision.  Your abdomen will be filled with a gas. This helps the surgeon to see your organs more easily and it makes more space to work.  The surgeon will insert the laparoscope through the incision. The scope has a camera that will send pictures to a  monitor in the operating room.  The surgeon will make several other small incisions in your abdomen to insert the other instruments that are needed during the procedure.  Another instrument (dilator) will be passed through your mouth and down your esophagus into the upper part of your stomach. The dilator will prevent your LES from being closed too tightly during surgery.  The surgeon will pass the top portion of your stomach behind the lower part of your esophagus and wrap it all the way around. This will be stitched into place.  If you have a hiatal hernia, it will be repaired during this procedure.  All instruments will be removed, and your incisions will be closed under your skin with stitches (sutures). Skin adhesive strips may also be used.  A bandage (dressing) will be placed on your skin over the incisions. The procedure may vary among health care providers and hospitals. What happens after the procedure?  You will be moved to a recovery area.  Your blood pressure, heart rate, breathing rate, and blood oxygen level will be monitored often until the medicines you were given have worn off.  You will be given pain medicine as needed.  Your IV tube will be kept in until you are able to drink fluids. This information is not intended to replace advice given to you by your health care provider. Make sure you discuss any questions you have with your health care provider. Document Released: 06/01/2014 Document Revised: 10/17/2015 Document Reviewed: 01/10/2014 Elsevier Interactive Patient Education  2017 Elsevier Inc.   Laparoscopic Nissen Fundoplication, Care After Refer to this sheet in the next few weeks. These instructions provide you with information about caring for yourself after your procedure. Your health care provider may also give you more specific instructions. Your treatment has been planned according to current medical practices, but problems sometimes occur. Call your  health care provider if you have any problems or questions after your procedure. What can I expect after the procedure? After the procedure, it is common to have:  Difficulty swallowing (dysphagia).  Excess gas (bloating). Follow these instructions at home: Medicines   Take medicines only as directed by your health care provider.  Do not drive or operate heavy machinery while taking pain medicine. Incision care   There are many different ways to close and cover an incision, including stitches (sutures), skin glue, and adhesive strips. Follow your health care provider's instructions about:  Incision care.  Bandage (dressing) changes and removal.  Incision closure removal.  Check your incision areas every day for signs of infection. Watch for:  Redness, swelling, or pain.  Fluid, blood, or pus.  Do not take baths, swim, or use a hot tub until your health care provider approves. Take showers as directed by your health care provider. Eating and drinking   Follow your health care provider's instructions about eating.  You may need to follow a liquid-only diet for 2 weeks, followed by a diet of soft foods for 2 weeks.  You should return to your usual diet gradually.  Drink enough fluid to keep your urine clear or pale yellow. Activity   Return to your normal activities as directed by  your health care provider. Ask your health care provider what activities are safe for you.  Avoid strenuous exercise.  Do not lift anything that is heavier than 10 lb (4.5 kg).  Ask your health care provider when you can:  Return to sexual activity.  Drive.  Go back to work. Contact a health care provider if:  You have a fever.  Your pain gets worse or is not helped by medicine.  You have frequent nausea or vomiting.  You have continued abdominal bloating.  You have an ongoing (persistent) cough.  You have redness, swelling, or pain in any incision areas.  You have fluid,  blood, or pus coming from any incisions. Get help right away if:  You have trouble breathing.  You are unable to swallow.  You have persistent vomiting.  You have blood in your vomit.  You have severe abdominal pain. This information is not intended to replace advice given to you by your health care provider. Make sure you discuss any questions you have with your health care provider. Document Released: 01/02/2004 Document Revised: 10/17/2015 Document Reviewed: 01/10/2014 Elsevier Interactive Patient Education  2017 Elsevier Inc.   Diet After Nissen Fundoplication Surgery This diet information is for patients who have recently had Nissen fundoplication surgery to correct reflux disease or to repair various types of hernias, such as hiatal hernia and intrathoracic stomach. This diet may also be used for other gastrointestinal surgeries, such as Heller myotomy and repair of achalasia. The diet will help control diarrhea, excess gas and swallowing problems, which may occur after this type of surgery. Keeping Your Stomach from Stretching Eat small, frequent meals (six to eight per day). This will help you consume the majority of the nutrients you need without causing your stomach to feel full or distended.  Drinking large amounts of fluids with meals can stretch your stomach. You may drink fluids between meals as often as you like, but limit fluids to 1/2 cup (4 fluid ounces) with meals and one cup (8 fluid ounces) with snacks.  Sit upright while eating and stay upright for 30 minutes after each meal. Gravity can help food move through your digestive tract. Do not lie down after eating. Sit upright for 2 hours after your last meal or snack of the day.  Eat very slowly. Take your time when eating.  Take small bites and chew your food well to help aid in swallowing and digestion.  Avoid crusty breads and sticky, gummy foods, such as bananas, fresh doughy breads, rolls and doughnuts. These types  of foods become sticky and difficult to swallow.  Toasted breads tend to be better tolerated.  Lastly, if you eat sweets, consume them at the end of your meal to avoid a group of symptoms referred to as "dumping syndrome". This describes the rapid emptying of foods from the stomach to the small intestine. Sweetened beverages, candy and desserts move more rapidly and dump quickly into the intestines. This can cause symptoms of nausea, weakness, cold sweats, cramps, diarrhea and dizzy spells.  Avoiding Gas Avoid drinking through a straw. Do not chew gum or tobacco. These actions cause you to swallow air, which produces excess gas in your stomach. Chew with your mouth closed.  Avoid any foods that cause stomach gas and distention. These foods include corn, dried beans, peas, lentils, onions, broccoli, cauliflower and any food from the cabbage family.  Avoid carbonated drinks, alcohol, citrus and tomato products.  When will I be able to eat a  soft diet? After Nissen fundoplication surgery, your diet will be advanced slowly by your surgeon. Generally, you will be on a clear liquid diet for the first few meals. Then you will advance to the full liquid diet for a meal or two and eventually to a Nissen soft diet. Please be aware that each patient's tolerance to food is different. Your doctor will advance your diet depending on how well you progress after surgery. Clear Liquid Diet  The first diet after surgery is the clear liquid diet. It includes the following liquids: Apple juice  Cranberry juice  Grape juice  Chicken broth  Beef broth  Flavored gelatin (Jell-O)  Decaf tea and coffee  Caffeinated beverages are permitted based on tolerance  Popsicles  [place] ice Carbonated drinks (sodas) are not allowed for the first six to eight weeks after surgery. After this time you can try them again in small amounts.  Full Liquid Diet The full liquid diet contains anything on the clear liquid diet,  plus: Milk, soy, rice and almond (no chocolate)  Cream of wheat, cream of rice, grits  Strained creamed soups (no tomato or broccoli)  Vanilla and strawberry-flavored ice cream  Sherbet  Blended, custard styled or whipped yogurt (plain or vanilla only)  Vanilla and butterscotch pudding (no chocolate or coconut)  Nutritional drinks including Ensure, Boost, Carnation Instant Breakfast (no chocolate-flavored) Note: Dairy products, such as milk, ice cream and pudding, may cause diarrhea in some people just after surgery. You may need to avoid milk products. If so, substitute them with lactose-free beverages, such as soy, rice, Lactaid or almond milks.  Nissen Soft Diet Food Category Foods to Choose Foods to Avoid  Beverages Milk, such as, whole, 2%, 1%, non-fat, or skim, soy, rice, almond  Caffeinated and decaf tea and coffee  Powdered drink mixes (in moderation)  Non-citrus juices (apple, grape, cranberry or blends of these)  Fruit nectars  Nutritional drinks including Boost, Ensure, Carnation Instant Breakfast Chocolate milk, cocoa or other chocolate-flavored drinks  Carbonated drinks  Alcohol  Citrus juices like orange, grapefruit, lemon and lime  Breads Pancakes, [place] toast and waffles  Crackers (saltine, butter, soda, graham, Goldfish and Cheese Nips)  Toasted bread Untoasted bread, bagels, [place] and hard rolls, English muffins  Crackers with nuts, seeds, fresh or dried fruit, coconut, or highly seasoned, such as garlic or onion-flavored  Sweet rolls, coffee cake or doughnuts  Cereals Well cooked cereals, such as oatmeal (plain or flavored)  Cold cereal (Cornflakes, Rice Krispies, Cheerios, Special K plain, Rice Chex and puffed rice) Very coarse cereal, such as bran, shredded wheat  Any cereal with fresh or dried fruit, coconut, seeds or nuts  Desserts Eat in moderation and do not eat desserts or sweets by themselves. Plain cakes, cookies and cream-filled pies   Vanilla and butterscotch pudding or custard  Ice cream, ice milk, frozen yogurt and sherbet  Gelatin made from allowed foods  Fruit ices and popsicles Desserts containing chocolate, coconut, nuts, seeds, fresh or dried fruit, peppermint or spearmint  Eggs  Poached, hard boiled or scrambled Fried eggs and highly seasoned eggs (deviled eggs)  Fats Eat in moderation. Butter and margarine  Mayonnaise and vegetable oils  Mildly seasoned cream sauces and gravies  Plain cream cheese  Sour cream Highly seasoned salad dressings, cream sauces and gravies  Bacon, bacon fat, ham fat, lard and salt pork  Fried foods  Nuts  Fruits Fruit juice  Any canned or cooked fruit except those listed in the  AVOID column ALL fresh fruits, such as citrus, bananas and pineapple  Canned pineapple  Dried fruits, such as raisins, berries  Fruits with seeds, such as berries, kiwi and figs  Meat, Fish, Poultry, and [employer] may be ground, minced or chopped to ease swallowing and digestion  Tender, well cooked and moist cuts of beef, chicken, [place] and pork  Veal and lamb  Flaky, cooked fish  Canned tuna  Cottage and ricotta cheeses  Mild cheese, such as American, brick, mozzarella and baby Swiss  Creamy peanut butter  Plain custard or blended fruit yogurt  Moist casseroles, such as macaroni & cheese, tuna noodle  Grilled or toasted cheese sandwich Tough meats with a lot of gristle  Fried, highly seasoned, smoked and fatty meat, fish or poultry, such as frankfurters, luncheon meats, sausage, bacon, spare ribs, beef brisket, sardines, anchovies, duck and goose  Chili and other entrees made with pepper or chili pepper  Shellfish  Strongly flavored cheeses, such as sharp cheese, extra sharp cheddar, cheese containing peppers or other seasonings  Crunchy peanut butter  Any yogurt with nuts, seeds, coconut, strawberries or raspberries  Potatoes and Starches Peeled, mashed or boiled white or sweet  potatoes  Oven-baked potatoes without skin  Well cooked white rice, enriched noodles, barley, spaghetti, macaroni and other pastas Fried potatoes, potato skins and potato chips  Hard and soft taco shells  Fried, brown or wild rice  Soups Mildly flavored meat stocks  Cream soups made from allowed foods Highly seasoned soups and tomato based soups, cream soups made with gas producing vegetables, such as broccoli, cauliflower, onion, etc.  Sweets and Snacks Use in moderation and do not eat large amounts of sweets by themselves. Syrup, honey, jelly and seedless jam  Plain hard candies and plain candies made with allowed ingredients  Molasses  Marshmallows  Other candy made from allowed ingredients  Thin pretzels Jam, marmalade and preserves  Chocolate in any form  Any candy containing nuts, coconut, seeds, peppermint, spearmint or dried or fresh fruit  Popcorn, potato chips, tortilla chips  Soft or hard thick pretzels, such as sourdough  Vegetables Well cooked soft vegetables without seeds or skins, such as asparagus tips, beets, carrots, green and wax beans, chopped spinach, tender canned baby peas, squash and pumpkin Raw vegetables, tomatoes, tomato juice, tomato sauce and V-8 juice  Gas producing vegetables, such as broccoli, Brussel sprouts, cabbage, cauliflower, onions, corn, cucumber, green peppers, rutabagas, turnips, radishes and sauerkraut  Dried beans, peas and lentils  Miscellaneous Salt and spices in moderation  Mustard and vinegar in moderation Fried or highly seasoned foods  Coconut and seeds  Pickles and olives  Chili sauces, ketchup, barbecue sauce, horseradish, black pepper, chili powder and onion and garlic seasonings  Any other strongly flavored seasoning, condiment, spice or herb not tolerated  Any food not tolerated   .as

## 2018-02-08 ENCOUNTER — Telehealth: Payer: Self-pay | Admitting: Surgery

## 2018-02-08 NOTE — Telephone Encounter (Signed)
I have called patient back. He would like to schedule his surgery the week of October 7th. I informed him that I would make all efforts to accommodate this for him. I will confirm a surgery date and speak with patient tomorrow.

## 2018-02-08 NOTE — Telephone Encounter (Signed)
Patient called to set up surgery with Dr Everlene FarrierPabon. He ask if you would call heim as soon as you can because he has some other things going on witch will be determined when he can have his surgery. Thank You.

## 2018-02-09 ENCOUNTER — Encounter: Payer: Self-pay | Admitting: Surgery

## 2018-02-09 NOTE — H&P (View-Only) (Signed)
Outpatient Surgical Follow Up  02/09/2018  Joshua Ramirez is an 42 y.o. male.   Chief Complaint  Patient presents with  . Follow-up    Umbilical Hernia    HPI: [name] following up secondary to severe reflux disease as well as symptomatic umbilical hernia.  He continues to have daily reflux symptoms that are worsening when he lays down.  He does have some regurgitation.  No evidence of dysphagia.  I order a swallow study and I have personally reviewed showing evidence of reflux showing no evidence of his strictures and there is good motility within the esophagus.  There is a small hiatal hernia. Doing well and continues to be interested in surgical options     Past Medical History:  Diagnosis Date  . Arthritis    knee s/p right knee surgery meniscal tear with bone fragments, ankle (ortho Dr. Menz)  . GERD (gastroesophageal reflux disease)   . H. pylori infection   . OSA on CPAP   . Sleep apnea 06/2017   PT JUST WAS DX WITH SLEEP APNEA LAST WEEK AND IS WAITING ON HIS CPAP MACHINE    Past Surgical History:  Procedure Laterality Date  . ESOPHAGOGASTRODUODENOSCOPY (EGD) WITH PROPOFOL N/A 01/26/2018   Procedure: ESOPHAGOGASTRODUODENOSCOPY (EGD) WITH PROPOFOL;  Surgeon: Anna, Kiran, MD;  Location: [facility] ENDOSCOPY;  Service: Gastroenterology;  Laterality: N/A;  . KIDNEY DONATION     2004  . KNEE ARTHROSCOPY WITH MEDIAL MENISECTOMY Right 07/15/2017   Procedure: KNEE ARTHROSCOPY WITH MEDIAL MENISECTOMY;  Surgeon: Menz, Michael, MD;  Location: [facility] ORS;  Service: Orthopedics;  Laterality: Right;  . meniscal tear     right knee Dr. Menz 2014   . MENISECTOMY Right 07/15/2017   Procedure: MENISECTOMY;  Surgeon: Menz, Michael, MD;  Location: [facility] ORS;  Service: Orthopedics;  Laterality: Right;    Family History  Problem Relation Age of Onset  . Drug abuse Mother   . Hyperlipidemia Mother   . Hypertension Mother   . Cancer Maternal Aunt        brain, breast, lung ? primary was smoker   .  Prostate cancer Neg Hx   . Bladder Cancer Neg Hx   . Kidney cancer Neg Hx     Social History:  reports that he has never smoked. He has never used smokeless tobacco. He reports that he drinks about 1.0 - 2.0 standard drinks of alcohol per week. He reports that he does not use drugs.  Allergies: No Known Allergies  Medications reviewed.    ROS Full ROS performed and is otherwise negative other than what is stated in HPI   BP 128/76   Pulse 91   Temp 97.9 F (36.6 C) (Skin)   Ht 6' (1.829 m)   Wt 275 lb (124.7 kg)   BMI 37.30 kg/m   Physical Exam  Constitutional: He is oriented to person, place, and time. He appears well-developed and well-nourished. No distress.  Neck: Normal range of motion. No JVD present. No tracheal deviation present. No thyromegaly present.  Cardiovascular: Normal rate, regular rhythm and normal heart sounds.  Pulmonary/Chest: Effort normal. No stridor. No respiratory distress. He has no wheezes. He has no rales.  Abdominal: Soft. He exhibits no distension and no mass. There is tenderness. There is no rebound and no guarding. A hernia is present.  diastasis recti w reducible UH, mild tenderness, no peritonitis  Neurological: He is alert and oriented to person, place, and time. He displays normal reflexes. No cranial nerve deficit or   sensory deficit. He exhibits normal muscle tone. Coordination normal.  Skin: Skin is warm. Capillary refill takes less than 2 seconds.  Psychiatric: He has a normal mood and affect. His behavior is normal. Judgment and thought content normal.  Nursing note and vitals reviewed.     Assessment/Plan: 42-year-old male with right calcitonin reflux with a hiatal hernia and a symptomatic umbilical hernia.  He is having significant symptoms and is very interested in an antireflux procedure.  Discussed with the patient in detail about options of medical management versus surgical therapy.  He wishes to proceed with antireflux  procedure.  I do think that he is a great candidate for robotic Nissen fundoplication and repair of hiatal hernia as well as repair of umbilical hernia during the same operative setting.  Procedure discussed with the patient in detail.  Risk benefit and possible complications including but not limited to: Bleeding, infection, esophageal perforation, small bowel injuries, recurrence of symptoms and possible re-interventions were discussed with the patient in detail.  He understands and wishes to proceed   Greater than 50% of the 25 minutes  visit was spent in counseling/coordination of care   Dresean Beckel, MD FACS General Surgeon 

## 2018-02-09 NOTE — Progress Notes (Signed)
Outpatient Surgical Follow Up  02/09/2018  Joshua Ramirez is an 42 y.o. male.   Chief Complaint  Patient presents with  . Follow-up    Umbilical Hernia    HPI: Scout following up secondary to severe reflux disease as well as symptomatic umbilical hernia.  He continues to have daily reflux symptoms that are worsening when he lays down.  He does have some regurgitation.  No evidence of dysphagia.  I order a swallow study and I have personally reviewed showing evidence of reflux showing no evidence of his strictures and there is good motility within the esophagus.  There is a small hiatal hernia. Doing well and continues to be interested in surgical options     Past Medical History:  Diagnosis Date  . Arthritis    knee s/p right knee surgery meniscal tear with bone fragments, ankle (ortho Dr. Rosita Kea)  . GERD (gastroesophageal reflux disease)   . H. pylori infection   . OSA on CPAP   . Sleep apnea 06/2017   PT JUST WAS DX WITH SLEEP APNEA LAST WEEK AND IS WAITING ON HIS CPAP MACHINE    Past Surgical History:  Procedure Laterality Date  . ESOPHAGOGASTRODUODENOSCOPY (EGD) WITH PROPOFOL N/A 01/26/2018   Procedure: ESOPHAGOGASTRODUODENOSCOPY (EGD) WITH PROPOFOL;  Surgeon: [name], MD;  Location: [facility] ENDOSCOPY;  Service: Gastroenterology;  Laterality: N/A;  . KIDNEY DONATION     2004  . KNEE ARTHROSCOPY WITH MEDIAL MENISECTOMY Right 07/15/2017   Procedure: KNEE ARTHROSCOPY WITH MEDIAL MENISECTOMY;  Surgeon: Kennedy Bucker, MD;  Location: [facility] ORS;  Service: Orthopedics;  Laterality: Right;  . meniscal tear     right knee Dr. Rosita Kea 2014   . MENISECTOMY Right 07/15/2017   Procedure: MENISECTOMY;  Surgeon: Kennedy Bucker, MD;  Location: [facility] ORS;  Service: Orthopedics;  Laterality: Right;    Family History  Problem Relation Age of Onset  . Drug abuse Mother   . Hyperlipidemia Mother   . Hypertension Mother   . Cancer Maternal Aunt        brain, breast, lung ? primary was smoker   .  Prostate cancer Neg Hx   . Bladder Cancer Neg Hx   . Kidney cancer Neg Hx     Social History:  reports that he has never smoked. He has never used smokeless tobacco. He reports that he drinks about 1.0 - 2.0 standard drinks of alcohol per week. He reports that he does not use drugs.  Allergies: No Known Allergies  Medications reviewed.    ROS Full ROS performed and is otherwise negative other than what is stated in HPI   BP 128/76   Pulse 91   Temp 97.9 F (36.6 C) (Skin)   Ht 6' (1.829 m)   Wt 275 lb (124.7 kg)   BMI 37.30 kg/m   Physical Exam  Constitutional: He is oriented to person, place, and time. He appears well-developed and well-nourished. No distress.  Neck: Normal range of motion. No JVD present. No tracheal deviation present. No thyromegaly present.  Cardiovascular: Normal rate, regular rhythm and normal heart sounds.  Pulmonary/Chest: Effort normal. No stridor. No respiratory distress. He has no wheezes. He has no rales.  Abdominal: Soft. He exhibits no distension and no mass. There is tenderness. There is no rebound and no guarding. A hernia is present.  diastasis recti w reducible UH, mild tenderness, no peritonitis  Neurological: He is alert and oriented to person, place, and time. He displays normal reflexes. No cranial nerve deficit or  sensory deficit. He exhibits normal muscle tone. Coordination normal.  Skin: Skin is warm. Capillary refill takes less than 2 seconds.  Psychiatric: He has a normal mood and affect. His behavior is normal. Judgment and thought content normal.  Nursing note and vitals reviewed.     Assessment/Plan: 42 year old male with right calcitonin reflux with a hiatal hernia and a symptomatic umbilical hernia.  He is having significant symptoms and is very interested in an antireflux procedure.  Discussed with the patient in detail about options of medical management versus surgical therapy.  He wishes to proceed with antireflux  procedure.  I do think that he is a great candidate for robotic Nissen fundoplication and repair of hiatal hernia as well as repair of umbilical hernia during the same operative setting.  Procedure discussed with the patient in detail.  Risk benefit and possible complications including but not limited to: Bleeding, infection, esophageal perforation, small bowel injuries, recurrence of symptoms and possible re-interventions were discussed with the patient in detail.  He understands and wishes to proceed   Greater than 50% of the 25 minutes  visit was spent in counseling/coordination of care   Sterling Bigiego Sybil Shrader, MD [facility] CenterFACS General Surgeon

## 2018-02-10 ENCOUNTER — Telehealth: Payer: Self-pay | Admitting: Surgery

## 2018-02-10 NOTE — Telephone Encounter (Signed)
Pt advised of pre op date/time and sx date. Sx: 03/03/18 with Dr Andee LinemanPabon-robot assisted lap nissen fundoplication with hiatal hernia repair. Possible umbilical hernia repair.  Pre op: 02/22/18 @ 8:00am--office interview.   Patient made aware to call [phone], between 1-3:00pm the day before surgery, to find out what time to arrive.

## 2018-02-17 ENCOUNTER — Ambulatory Visit: Payer: BC Managed Care – PPO | Admitting: Internal Medicine

## 2018-02-22 ENCOUNTER — Encounter
Admission: RE | Admit: 2018-02-22 | Discharge: 2018-02-22 | Disposition: A | Discharge status: Home / Self Care | Payer: BC Managed Care – PPO | Source: Ambulatory Visit | Attending: Surgery | Admitting: Surgery

## 2018-02-22 ENCOUNTER — Other Ambulatory Visit: Payer: Self-pay

## 2018-02-22 DIAGNOSIS — Z01812 Encounter for preprocedural laboratory examination: Secondary | ICD-10-CM | POA: Diagnosis not present

## 2018-02-22 LAB — CBC
HCT: 45.9 % (ref 40.0–52.0)
Hemoglobin: 15.6 g/dL (ref 13.0–18.0)
MCH: 28.8 pg (ref 26.0–34.0)
MCHC: 33.9 g/dL (ref 32.0–36.0)
MCV: 84.9 fL (ref 80.0–100.0)
PLATELETS: 212 10*3/uL (ref 150–440)
RBC: 5.41 MIL/uL (ref 4.40–5.90)
RDW: 15 % — AB (ref 11.5–14.5)
WBC: 6 10*3/uL (ref 3.8–10.6)

## 2018-02-22 NOTE — Patient Instructions (Signed)
Your procedure is scheduled on: Thursday 03/03/18 Report to [place]. To find out your arrival time please call [phone] between 1PM - 3PM on Wednesday 03/02/18.  Remember: Instructions that are not followed completely may result in serious medical risk, up to and including death, or upon the discretion of your surgeon and anesthesiologist your surgery may need to be rescheduled.     _X__ 1. Do not eat food after midnight the night before your procedure.                 No gum chewing or hard candies. You may drink clear liquids up to 2 hours                 before you are scheduled to arrive for your surgery- DO not drink clear                 liquids within 2 hours of the start of your surgery.                 Clear Liquids include:  water, apple juice without pulp, clear carbohydrate                 drink such as Clearfast or Gatorade, Black Coffee or Tea (Do not add                 anything to coffee or tea).  __X__2.  On the morning of surgery brush your teeth with toothpaste and water, you                 may rinse your mouth with mouthwash if you wish.  Do not swallow any              toothpaste of mouthwash.     _X__ 3.  No Alcohol for 24 hours before or after surgery.   _X__ 4.  Do Not Smoke or use e-cigarettes For 24 Hours Prior to Your Surgery.                 Do not use any chewable tobacco products for at least 6 hours prior to                 surgery.  ____  5.  Bring all medications with you on the day of surgery if instructed.   __X__  6.  Notify your doctor if there is any change in your medical condition      (cold, fever, infections).     Do not wear jewelry, make-up, hairpins, clips or nail polish. Do not wear lotions, powders, or perfumes.  Do not shave 48 hours prior to surgery. Men may shave face and neck. Do not bring valuables to the hospital.    [facility] is not responsible for any belongings or  valuables.  Contacts, dentures/partials or body piercings may not be worn into surgery. Bring a case for your contacts, glasses or hearing aids, a denture cup will be supplied. Leave your suitcase in the car. After surgery it may be brought to your room. For patients admitted to the hospital, discharge time is determined by your treatment team.   Patients discharged the day of surgery will not be allowed to drive home.   Please read over the following fact sheets that you were given:   MRSA Information  __X__ Take these medicines the morning of surgery with A SIP OF WATER:  1. None  2.   3.   4.  5.  6.  ____ Fleet Enema (as directed)   __X__ Use CHG Soap/SAGE wipes as directed  ____ Use inhalers on the day of surgery  ____ Stop metformin/Janumet/Farxiga 2 days prior to surgery    ____ Take 1/2 of usual insulin dose the night before surgery. No insulin the morning          of surgery.   ____ Stop Blood Thinners Coumadin/Plavix/Xarelto/Pleta/Pradaxa/Eliquis/Effient/Aspirin  on   Or contact your Surgeon, Cardiologist or Medical Doctor regarding  ability to stop your blood thinners  __X__ Stop Anti-inflammatories 7 days before surgery such as Advil, Ibuprofen, Motrin,  BC or Goodies Powder, Naprosyn, Naproxen, Aleve, Aspirin    __X__ Stop all herbal supplements, fish oil or vitamin E until after surgery.    __X__ Bring C-Pap to the hospital.    STOP TAKING nabumetone (RELAFEN) 7 DAYS PRIOR TO SURGERY

## 2018-03-01 ENCOUNTER — Encounter: Payer: Self-pay | Admitting: Gastroenterology

## 2018-03-01 ENCOUNTER — Ambulatory Visit: Payer: BC Managed Care – PPO | Admitting: Gastroenterology

## 2018-03-01 VITALS — BP 119/73 | HR 98 | Ht 72.0 in | Wt 272.6 lb

## 2018-03-01 DIAGNOSIS — R1013 Epigastric pain: Secondary | ICD-10-CM | POA: Diagnosis not present

## 2018-03-01 NOTE — Progress Notes (Signed)
[name] MD, MRCP(U.K) [address]  [address]  [place], [place] [address]  Main: [phone]  Fax: [phone]   Primary Care Physician: McLean-Scocuzza, Pasty Spillers, MD  Primary Gastroenterologist:  Dr. [name]   Chief Complaint  Patient presents with  . Follow-up    Epigastric pain    HPI: Joshua Ramirez is a 42 y.o. male    Summary of history :  He was initially referred and seen on 01/18/18 for nausea and vomiting .CT abdomen showed no acute findings but possible mid line hernia.. H pylori serum checked and was positive.   He says that over the last 2-3 years, throws up his food anytime within 20 mins . Can occur upto twice a week. Says he knows its coming up and coughing followed by food coming back up . Takes protonix for GERD. Takes it 30 mins before meals. Most of the occasions he throws up food is at night, before he goes to bed. Can throw up food from the previous meal many hours after. He is not diabetic. Not on any opiods. Does not smoke . No use of marijuana, NSAID use. Denies any weight loss .   Says he has a bowel movement daily or every other day - not hard.   Interval history   01/18/18 -03/01/18   Did not get stool H pylori testing , EGD showed normal appearance with small hiatal hernia , biopsies of the stomach were negative for H pylori.   Scheduled for Nissens and ventral hernia repair with Dr Shelva Majestic.    Doing better. Taking PPI BID. At this time still has heartburn. Also has some mucus in his chest in the mornings that he spits out.    Current Outpatient Medications  Medication Sig Dispense Refill  . nabumetone (RELAFEN) 750 MG tablet Take 750 mg by mouth 2 (two) times daily.   11  . Olopatadine HCl 0.2 % SOLN Place 1 drop into both eyes daily as needed (runny eyes).   3  . pantoprazole (PROTONIX) 40 MG tablet Take 1 tablet (40 mg total) by mouth every evening. Before dinner 30 minutes 90 tablet 3  . tadalafil (CIALIS) 5 MG tablet Take 1  tablet (5 mg total) by mouth daily as needed for erectile dysfunction. 30 tablet 11   No current facility-administered medications for this visit.     Allergies as of 03/01/2018  . (No Known Allergies)    ROS:  General: Negative for anorexia, weight loss, fever, chills, fatigue, weakness. ENT: Negative for hoarseness, difficulty swallowing , nasal congestion. CV: Negative for chest pain, angina, palpitations, dyspnea on exertion, peripheral edema.  Respiratory: Negative for dyspnea at rest, dyspnea on exertion, cough, sputum, wheezing.  GI: See history of present illness. GU:  Negative for dysuria, hematuria, urinary incontinence, urinary frequency, nocturnal urination.  Endo: Negative for unusual weight change.    Physical Examination:   BP 119/73   Pulse 98   Ht 6' (1.829 m)   Wt 272 lb 9.6 oz (123.7 kg)   BMI 36.97 kg/m   General: Well-nourished, well-developed in no acute distress.  Eyes: No icterus. Conjunctivae pink. Mouth: Oropharyngeal mucosa moist and pink , no lesions erythema or exudate. Lungs: Clear to auscultation bilaterally. Non-labored. Heart: Regular rate and rhythm, no murmurs rubs or gallops.  Abdomen: Bowel sounds are normal, nontender, nondistended, no hepatosplenomegaly or masses, no abdominal bruits .Vental redicible hernia.  no rebound or guarding.   Extremities: No lower extremity edema. No clubbing  or deformities. Neuro: Alert and oriented x 3.  Grossly intact. Skin: Warm and dry, no jaundice.   Psych: Alert and cooperative, normal mood and affect.   Imaging Studies: Dg Esophagus  Result Date: 02/04/2018 CLINICAL DATA:  Chronic dysphagia EXAM: ESOPHOGRAM / BARIUM SWALLOW / BARIUM TABLET STUDY TECHNIQUE: Combined double contrast and single contrast examination performed using thick barium liquid and thin barium liquid. The patient was observed with fluoroscopy swallowing a 13 mm barium sulphate tablet. FLUOROSCOPY TIME:  Fluoroscopy Time: 1 minutes  12 seconds Radiation Exposure Index (if provided by the fluoroscopic device): 73.80 mGy Number of Acquired Spot Images: 25 COMPARISON:  None. FINDINGS: The patient initiates deglutition without difficulty. Peristalsis is within normal limits. There is a small hiatal hernia with reflux of an incomplete column of barium to the upper esophagus which empties rather slowly. There is no esophageal stricture, mass, or ulceration. The pharynx appears normal. 13 mm barium tablet passed freely through the esophagus into the stomach intact. IMPRESSION: Small hiatal hernia with moderate gastroesophageal reflux. No esophageal stricture, mass, or ulceration. Mucosal folds appear unremarkable. Peristalsis unremarkable. Electronically Signed   By: Bretta Bang III M.D.   On: 02/04/2018 09:28   US Scrotum W/doppler  Result Date: 02/02/2018 CLINICAL DATA:  Testicular calcifications demonstrated on CT scan of January 03, 2018 EXAM: SCROTAL ULTRASOUND DOPPLER ULTRASOUND OF THE TESTICLES TECHNIQUE: Complete ultrasound examination of the testicles, epididymis, and other scrotal structures was performed. Color and spectral Doppler ultrasound were also utilized to evaluate blood flow to the testicles. COMPARISON:  CT scan of the abdomen and pelvis of January 03, 2018 FINDINGS: Right testicle Measurements: 3.3 x 1.8 x 2.3 cm. Innumerable parenchymal calcifications are present. There is no discrete testicular mass. Left testicle Measurements: 3.1 x 1.8 x 2.3 cm. There are scattered calcifications within the left testicle but not as numerous as those on the right. No discrete mass is observed. Right epididymis: There is a tiny right epididymal cyst measuring 2 mm in diameter. Left epididymis: There is a tiny left epididymal cyst measuring 3 mm in diameter. Hydrocele:  None visualized. Varicocele:  None visualized. Pulsed Doppler interrogation of both testes demonstrates normal low resistance arterial and venous waveforms bilaterally.  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 No suspicious testicular masses. Tiny epididymal cysts bilaterally. Normal vascularity of the testes and epididymal structures. Electronically Signed   By: David  [place] M.D.   On: 02/02/2018 13:06    Assessment and Plan:   Joshua Ramirez is a 42 y.o. y/o male here to follow up  for nausea and vomiting.He has a fairly large ventral hernia on examination which I suspect may be contributing to his symptoms.  Due to have surgery for hiatal hernia and ventral hernia.  Can stop PPI after surgery.   F/u as needed  Dr [name]  MD,MRCP [facility]) Follow up PRN

## 2018-03-02 MED ORDER — DEXTROSE 5 % IV SOLN
3.0000 g | INTRAVENOUS | Status: AC
  Administered 2018-03-03: 3 g via INTRAVENOUS
  Filled 2018-03-02: qty 3000

## 2018-03-03 ENCOUNTER — Ambulatory Visit: Payer: BC Managed Care – PPO | Admitting: Registered Nurse

## 2018-03-03 ENCOUNTER — Encounter
Admission: RE | Disposition: A | Discharge status: Home / Self Care | Payer: Self-pay | Source: Ambulatory Visit | Attending: Surgery

## 2018-03-03 ENCOUNTER — Other Ambulatory Visit: Payer: Self-pay

## 2018-03-03 ENCOUNTER — Encounter: Payer: Self-pay | Admitting: *Deleted

## 2018-03-03 ENCOUNTER — Observation Stay
Admission: RE | Admit: 2018-03-03 | Discharge: 2018-03-04 | Disposition: A | Discharge status: Home / Self Care | Payer: BC Managed Care – PPO | Source: Ambulatory Visit | Attending: Surgery | Admitting: Surgery

## 2018-03-03 DIAGNOSIS — K760 Fatty (change of) liver, not elsewhere classified: Secondary | ICD-10-CM | POA: Diagnosis not present

## 2018-03-03 DIAGNOSIS — Z9889 Other specified postprocedural states: Secondary | ICD-10-CM | POA: Diagnosis present

## 2018-03-03 DIAGNOSIS — G4730 Sleep apnea, unspecified: Secondary | ICD-10-CM | POA: Insufficient documentation

## 2018-03-03 DIAGNOSIS — K429 Umbilical hernia without obstruction or gangrene: Secondary | ICD-10-CM

## 2018-03-03 DIAGNOSIS — K2100 Gastro-esophageal reflux disease with esophagitis, without bleeding: Secondary | ICD-10-CM

## 2018-03-03 DIAGNOSIS — G4733 Obstructive sleep apnea (adult) (pediatric): Secondary | ICD-10-CM | POA: Diagnosis not present

## 2018-03-03 DIAGNOSIS — K449 Diaphragmatic hernia without obstruction or gangrene: Secondary | ICD-10-CM | POA: Diagnosis not present

## 2018-03-03 HISTORY — PX: UMBILICAL HERNIA REPAIR: SHX196

## 2018-03-03 HISTORY — PX: ROBOTIC ASSISTED LAPAROSCOPIC NISSEN FUNDOPLICATION: SHX6509

## 2018-03-03 SURGERY — ROBOTIC ASSISTED LAPAROSCOPIC NISSEN FUNDOPLICATION
Anesthesia: General

## 2018-03-03 MED ORDER — ONDANSETRON HCL 4 MG/2ML IJ SOLN: 4.0000 mg | Freq: Four times a day (QID) | INTRAMUSCULAR | Status: DC | PRN

## 2018-03-03 MED ORDER — ONDANSETRON HCL 4 MG/2ML IJ SOLN: 4.0000 mg | Freq: Once | INTRAMUSCULAR | Status: DC | PRN

## 2018-03-03 MED ORDER — ONDANSETRON HCL 4 MG/2ML IJ SOLN
INTRAMUSCULAR | Status: AC
  Filled 2018-03-03: qty 2

## 2018-03-03 MED ORDER — SODIUM CHLORIDE 0.9 % IV SOLN
INTRAVENOUS | Status: DC | PRN
  Administered 2018-03-03: 50 ug/min via INTRAVENOUS

## 2018-03-03 MED ORDER — SUCCINYLCHOLINE CHLORIDE 20 MG/ML IJ SOLN
INTRAMUSCULAR | Status: DC | PRN
  Administered 2018-03-03: 140 mg via INTRAVENOUS

## 2018-03-03 MED ORDER — ONDANSETRON 4 MG PO TBDP: 4.0000 mg | ORAL_TABLET | Freq: Four times a day (QID) | ORAL | Status: DC | PRN

## 2018-03-03 MED ORDER — ROCURONIUM BROMIDE 100 MG/10ML IV SOLN
INTRAVENOUS | Status: DC | PRN
  Administered 2018-03-03: 30 mg via INTRAVENOUS
  Administered 2018-03-03: 45 mg via INTRAVENOUS
  Administered 2018-03-03: 10 mg via INTRAVENOUS
  Administered 2018-03-03: 5 mg via INTRAVENOUS
  Administered 2018-03-03: 20 mg via INTRAVENOUS

## 2018-03-03 MED ORDER — ROCURONIUM BROMIDE 50 MG/5ML IV SOLN
INTRAVENOUS | Status: AC
  Filled 2018-03-03: qty 1

## 2018-03-03 MED ORDER — ESMOLOL HCL 100 MG/10ML IV SOLN
INTRAVENOUS | Status: DC | PRN
  Administered 2018-03-03 (×5): 10 mg via INTRAVENOUS

## 2018-03-03 MED ORDER — ONDANSETRON HCL 4 MG/2ML IJ SOLN
INTRAMUSCULAR | Status: DC | PRN
  Administered 2018-03-03: 4 mg via INTRAVENOUS

## 2018-03-03 MED ORDER — LACTATED RINGERS IV SOLN
INTRAVENOUS | Status: DC
  Administered 2018-03-03 (×2): via INTRAVENOUS

## 2018-03-03 MED ORDER — FENTANYL CITRATE (PF) 250 MCG/5ML IJ SOLN
INTRAMUSCULAR | Status: AC
  Filled 2018-03-03: qty 5

## 2018-03-03 MED ORDER — KETOROLAC TROMETHAMINE 30 MG/ML IJ SOLN: 30.0000 mg | Freq: Four times a day (QID) | INTRAMUSCULAR | Status: DC | PRN

## 2018-03-03 MED ORDER — ESMOLOL HCL 100 MG/10ML IV SOLN
INTRAVENOUS | Status: AC
  Filled 2018-03-03: qty 10

## 2018-03-03 MED ORDER — DEXAMETHASONE SODIUM PHOSPHATE 10 MG/ML IJ SOLN
INTRAMUSCULAR | Status: DC | PRN
  Administered 2018-03-03: 8 mg via INTRAVENOUS

## 2018-03-03 MED ORDER — HYDRALAZINE HCL 20 MG/ML IJ SOLN: 10.0000 mg | INTRAMUSCULAR | Status: DC | PRN

## 2018-03-03 MED ORDER — MIDAZOLAM HCL 2 MG/2ML IJ SOLN
INTRAMUSCULAR | Status: AC
  Filled 2018-03-03: qty 2

## 2018-03-03 MED ORDER — ACETAMINOPHEN 500 MG PO TABS
1000.0000 mg | ORAL_TABLET | Freq: Four times a day (QID) | ORAL | Status: DC
  Administered 2018-03-03 – 2018-03-04 (×3): 1000 mg via ORAL
  Filled 2018-03-03 (×3): qty 2

## 2018-03-03 MED ORDER — ACETAMINOPHEN 500 MG PO TABS
ORAL_TABLET | ORAL | Status: AC
  Filled 2018-03-03: qty 2

## 2018-03-03 MED ORDER — GABAPENTIN 300 MG PO CAPS
ORAL_CAPSULE | ORAL | Status: AC
  Filled 2018-03-03: qty 1

## 2018-03-03 MED ORDER — DEXAMETHASONE SODIUM PHOSPHATE 10 MG/ML IJ SOLN
INTRAMUSCULAR | Status: AC
  Filled 2018-03-03: qty 1

## 2018-03-03 MED ORDER — EPHEDRINE SULFATE 50 MG/ML IJ SOLN
INTRAMUSCULAR | Status: DC | PRN
  Administered 2018-03-03: 5 mg via INTRAVENOUS
  Administered 2018-03-03: 10 mg via INTRAVENOUS

## 2018-03-03 MED ORDER — BUPIVACAINE-EPINEPHRINE (PF) 0.25% -1:200000 IJ SOLN
INTRAMUSCULAR | Status: AC
  Filled 2018-03-03: qty 30

## 2018-03-03 MED ORDER — LACTATED RINGERS IV SOLN
INTRAVENOUS | Status: DC
  Administered 2018-03-03: 14:00:00 via INTRAVENOUS

## 2018-03-03 MED ORDER — MORPHINE SULFATE (PF) 4 MG/ML IV SOLN
4.0000 mg | INTRAVENOUS | Status: DC | PRN
  Administered 2018-03-03: 4 mg via INTRAVENOUS
  Filled 2018-03-03: qty 1

## 2018-03-03 MED ORDER — LIDOCAINE HCL (CARDIAC) PF 100 MG/5ML IV SOSY
PREFILLED_SYRINGE | INTRAVENOUS | Status: DC | PRN
  Administered 2018-03-03: 20 mg via INTRAVENOUS
  Administered 2018-03-03: 80 mg via INTRAVENOUS

## 2018-03-03 MED ORDER — LIDOCAINE HCL (PF) 2 % IJ SOLN
INTRAMUSCULAR | Status: AC
  Filled 2018-03-03: qty 10

## 2018-03-03 MED ORDER — SUGAMMADEX SODIUM 500 MG/5ML IV SOLN
INTRAVENOUS | Status: AC
  Filled 2018-03-03: qty 5

## 2018-03-03 MED ORDER — SUGAMMADEX SODIUM 500 MG/5ML IV SOLN
INTRAVENOUS | Status: DC | PRN
  Administered 2018-03-03: 250 mg via INTRAVENOUS

## 2018-03-03 MED ORDER — FENTANYL CITRATE (PF) 100 MCG/2ML IJ SOLN
INTRAMUSCULAR | Status: DC | PRN
  Administered 2018-03-03: 25 ug via INTRAVENOUS
  Administered 2018-03-03 (×4): 50 ug via INTRAVENOUS

## 2018-03-03 MED ORDER — CHLORHEXIDINE GLUCONATE CLOTH 2 % EX PADS: 6.0000 | MEDICATED_PAD | Freq: Once | CUTANEOUS | Status: DC

## 2018-03-03 MED ORDER — FAMOTIDINE 20 MG PO TABS
20.0000 mg | ORAL_TABLET | Freq: Once | ORAL | Status: AC
  Administered 2018-03-03: 20 mg via ORAL

## 2018-03-03 MED ORDER — ACETAMINOPHEN 500 MG PO TABS
1000.0000 mg | ORAL_TABLET | ORAL | Status: AC
  Administered 2018-03-03: 1000 mg via ORAL

## 2018-03-03 MED ORDER — KETOROLAC TROMETHAMINE 30 MG/ML IJ SOLN
30.0000 mg | Freq: Four times a day (QID) | INTRAMUSCULAR | Status: DC
  Administered 2018-03-03 – 2018-03-04 (×4): 30 mg via INTRAVENOUS
  Filled 2018-03-03 (×4): qty 1

## 2018-03-03 MED ORDER — KETAMINE HCL 10 MG/ML IJ SOLN
INTRAMUSCULAR | Status: DC | PRN
  Administered 2018-03-03: 60 mg via INTRAVENOUS
  Administered 2018-03-03 (×3): 20 mg via INTRAVENOUS

## 2018-03-03 MED ORDER — OXYCODONE HCL 5 MG PO TABS
5.0000 mg | ORAL_TABLET | ORAL | Status: DC | PRN
  Administered 2018-03-04 (×2): 10 mg via ORAL
  Filled 2018-03-03 (×2): qty 2

## 2018-03-03 MED ORDER — ENOXAPARIN SODIUM 40 MG/0.4ML ~~LOC~~ SOLN
40.0000 mg | SUBCUTANEOUS | Status: DC
  Administered 2018-03-04: 40 mg via SUBCUTANEOUS
  Filled 2018-03-03: qty 0.4

## 2018-03-03 MED ORDER — PROPOFOL 500 MG/50ML IV EMUL
INTRAVENOUS | Status: AC
  Filled 2018-03-03: qty 50

## 2018-03-03 MED ORDER — PROCHLORPERAZINE MALEATE 10 MG PO TABS
10.0000 mg | ORAL_TABLET | Freq: Four times a day (QID) | ORAL | Status: DC | PRN
  Filled 2018-03-03: qty 1

## 2018-03-03 MED ORDER — CELECOXIB 200 MG PO CAPS
ORAL_CAPSULE | ORAL | Status: AC
  Filled 2018-03-03: qty 1

## 2018-03-03 MED ORDER — LACTATED RINGERS IV SOLN
INTRAVENOUS | Status: DC | PRN
  Administered 2018-03-03: 08:00:00 via INTRAVENOUS

## 2018-03-03 MED ORDER — BUPIVACAINE-EPINEPHRINE 0.25% -1:200000 IJ SOLN
INTRAMUSCULAR | Status: DC | PRN
  Administered 2018-03-03: 30 mL

## 2018-03-03 MED ORDER — MIDAZOLAM HCL 2 MG/2ML IJ SOLN
INTRAMUSCULAR | Status: DC | PRN
  Administered 2018-03-03: 2 mg via INTRAVENOUS

## 2018-03-03 MED ORDER — PROPOFOL 10 MG/ML IV BOLUS
INTRAVENOUS | Status: DC | PRN
  Administered 2018-03-03: 220 mg via INTRAVENOUS

## 2018-03-03 MED ORDER — PROCHLORPERAZINE EDISYLATE 10 MG/2ML IJ SOLN
5.0000 mg | Freq: Four times a day (QID) | INTRAMUSCULAR | Status: DC | PRN
  Filled 2018-03-03: qty 2

## 2018-03-03 MED ORDER — PHENYLEPHRINE HCL 10 MG/ML IJ SOLN
INTRAMUSCULAR | Status: AC
  Filled 2018-03-03: qty 1

## 2018-03-03 MED ORDER — CELECOXIB 200 MG PO CAPS
200.0000 mg | ORAL_CAPSULE | ORAL | Status: AC
  Administered 2018-03-03: 200 mg via ORAL

## 2018-03-03 MED ORDER — FENTANYL CITRATE (PF) 100 MCG/2ML IJ SOLN: 25.0000 ug | INTRAMUSCULAR | Status: DC | PRN

## 2018-03-03 MED ORDER — KETAMINE HCL 50 MG/ML IJ SOLN
INTRAMUSCULAR | Status: AC
  Filled 2018-03-03: qty 10

## 2018-03-03 MED ORDER — GABAPENTIN 300 MG PO CAPS
300.0000 mg | ORAL_CAPSULE | ORAL | Status: AC
  Administered 2018-03-03: 300 mg via ORAL

## 2018-03-03 MED ORDER — FAMOTIDINE 20 MG PO TABS
ORAL_TABLET | ORAL | Status: AC
  Filled 2018-03-03: qty 1

## 2018-03-03 MED ORDER — SUCCINYLCHOLINE CHLORIDE 20 MG/ML IJ SOLN
INTRAMUSCULAR | Status: AC
  Filled 2018-03-03: qty 1

## 2018-03-03 MED ORDER — PHENYLEPHRINE HCL 10 MG/ML IJ SOLN
INTRAMUSCULAR | Status: DC | PRN
  Administered 2018-03-03: 200 ug via INTRAVENOUS
  Administered 2018-03-03: 50 ug via INTRAVENOUS
  Administered 2018-03-03: 100 ug via INTRAVENOUS
  Administered 2018-03-03: 150 ug via INTRAVENOUS
  Administered 2018-03-03: 200 ug via INTRAVENOUS
  Administered 2018-03-03: 50 ug via INTRAVENOUS
  Administered 2018-03-03: 150 ug via INTRAVENOUS
  Administered 2018-03-03: 200 ug via INTRAVENOUS
  Administered 2018-03-03: 100 ug via INTRAVENOUS

## 2018-03-03 MED ORDER — DEXMEDETOMIDINE HCL 200 MCG/2ML IV SOLN
INTRAVENOUS | Status: DC | PRN
  Administered 2018-03-03: 12 ug via INTRAVENOUS
  Administered 2018-03-03: 8 ug via INTRAVENOUS
  Administered 2018-03-03: 12 ug via INTRAVENOUS

## 2018-03-03 SURGICAL SUPPLY — 60 items
APPLIER CLIP 11 MED OPEN (CLIP) ×2
BLADE CLIPPER SURG (BLADE) ×2 IMPLANT
BLADE SURG 15 STRL LF DISP TIS (BLADE) ×1 IMPLANT
BLADE SURG 15 STRL SS (BLADE) ×1
CANISTER SUCT 1200ML W/VALVE (MISCELLANEOUS) ×2 IMPLANT
CANNULA SEAL DVNC (CANNULA) ×1 IMPLANT
CANNULA SEALS 8.5MM (CANNULA) ×1
CANNULA SEALS DA VINCI (CANNULA) ×1
CHLORAPREP W/TINT 26ML (MISCELLANEOUS) ×2 IMPLANT
CLIP APPLIE 11 MED OPEN (CLIP) ×1 IMPLANT
CORD BIP STRL DISP 12FT (MISCELLANEOUS) ×2 IMPLANT
COVER WAND RF STERILE (DRAPES) ×2 IMPLANT
DEFOGGER SCOPE WARMER CLEARIFY (MISCELLANEOUS) ×2 IMPLANT
DRAPE 3 ARM ACCESS DA VINCI (DRAPES) ×1
DRAPE 3 ARM ACCESS DVNC (DRAPES) ×1 IMPLANT
DRAPE INCISE IOBAN 66X45 STRL (DRAPES) ×2 IMPLANT
DRAPE LAPAROTOMY 77X122 PED (DRAPES) ×2 IMPLANT
DRAPE SHEET LG 3/4 BI-LAMINATE (DRAPES) ×2 IMPLANT
DRSG TELFA 3X8 NADH (GAUZE/BANDAGES/DRESSINGS) ×2 IMPLANT
ELECT CAUTERY BLADE 6.4 (BLADE) ×2 IMPLANT
ELECT REM PT RETURN 9FT ADLT (ELECTROSURGICAL) ×2
ELECTRODE REM PT RTRN 9FT ADLT (ELECTROSURGICAL) ×1 IMPLANT
GLOVE BIO SURGEON STRL SZ7 (GLOVE) ×8 IMPLANT
GLOVE BIO SURGEON STRL SZ7.5 (GLOVE) ×4 IMPLANT
GLOVE INDICATOR 7.5 STRL GRN (GLOVE) ×4 IMPLANT
GOWN STRL REUS W/ TWL LRG LVL3 (GOWN DISPOSABLE) ×7 IMPLANT
GOWN STRL REUS W/TWL LRG LVL3 (GOWN DISPOSABLE) ×7
GRASPER SUT TROCAR 14GX15 (MISCELLANEOUS) ×2 IMPLANT
IV NS 1000ML (IV SOLUTION) ×1
IV NS 1000ML BAXH (IV SOLUTION) ×1 IMPLANT
KIT PINK PAD W/HEAD ARE REST (MISCELLANEOUS) ×2
KIT PINK PAD W/HEAD ARM REST (MISCELLANEOUS) ×1 IMPLANT
LABEL OR SOLS (LABEL) IMPLANT
NEEDLE HYPO 22GX1.5 SAFETY (NEEDLE) ×2 IMPLANT
NS IRRIG 500ML POUR BTL (IV SOLUTION) ×2 IMPLANT
PACK LAP CHOLECYSTECTOMY (MISCELLANEOUS) ×2 IMPLANT
SEAL CANN 8.5 DVNC (CANNULA) ×1 IMPLANT
SEALER ENDOWRIST ONE VESSEL (MISCELLANEOUS) ×2 IMPLANT
SLEEVE ADV FIXATION 5X100MM (TROCAR) ×2 IMPLANT
SOLUTION ELECTROLUBE (MISCELLANEOUS) ×2 IMPLANT
SPONGE LAP 18X18 RF (DISPOSABLE) ×2 IMPLANT
STRAP SAFETY 5IN WIDE (MISCELLANEOUS) ×2 IMPLANT
SUCTION IRRIG MULTIPORT DISP (INSTRUMENTS) ×2 IMPLANT
SUT DVC VLOC 180 0 12IN GS21 (SUTURE) ×2
SUT ETHIBOND NAB MO 7 #0 18IN (SUTURE) ×2 IMPLANT
SUT MNCRL 4-0 (SUTURE) ×1
SUT MNCRL 4-0 27XMFL (SUTURE) ×1
SUT MNCRL AB 4-0 PS2 18 (SUTURE) ×2 IMPLANT
SUT SILK 2 0 SH (SUTURE) ×6 IMPLANT
SUT VIC AB 3-0 SH 27 (SUTURE)
SUT VIC AB 3-0 SH 27X BRD (SUTURE) IMPLANT
SUT VLOC 90 2/L VL 12 GS22 (SUTURE) IMPLANT
SUTURE DVC VLC 180 0 12IN GS21 (SUTURE) ×1 IMPLANT
SUTURE MNCRL 4-0 27XMF (SUTURE) ×1 IMPLANT
SYR 20CC LL (SYRINGE) ×2 IMPLANT
TAPE TRANSPORE STRL 2 31045 (GAUZE/BANDAGES/DRESSINGS) ×2 IMPLANT
TRAY FOLEY SLVR 16FR LF STAT (SET/KITS/TRAYS/PACK) ×2 IMPLANT
TROCAR BALLN GELPORT 12X130M (ENDOMECHANICALS) ×2 IMPLANT
TROCAR XCEL NON-BLD 5MMX100MML (ENDOMECHANICALS) ×2 IMPLANT
TUBING INSUF HEATED (TUBING) ×2 IMPLANT

## 2018-03-03 NOTE — Transfer of Care (Signed)
Immediate Anesthesia Transfer of Care Note  Patient: Joshua Ramirez  Procedure(s) Performed: ROBOTIC ASSISTED LAPAROSCOPIC NISSEN FUNDOPLICATION WITH HIATAL HERNIA REPAIR (N/A ) HERNIA REPAIR UMBILICAL ADULT (N/A )  Patient Location: PACU  Anesthesia Type:General  Level of Consciousness: awake, alert  and oriented  Airway & Oxygen Therapy: Patient connected to face mask oxygen  Post-op Assessment: Post -op Vital signs reviewed and stable  Post vital signs: stable  Last Vitals:  Vitals Value Taken Time  BP 120/64 03/03/2018 12:02 PM  Temp    Pulse 100 03/03/2018 12:02 PM  Resp 18 03/03/2018 12:02 PM  SpO2 100 % 03/03/2018 12:02 PM  Vitals shown include unvalidated device data.  Last Pain:  Vitals:   03/03/18 0607  TempSrc: Temporal  PainSc: 0-No pain         Complications: No apparent anesthesia complications

## 2018-03-03 NOTE — Interval H&P Note (Signed)
History and Physical Interval Note:  03/03/2018 7:25 AM  Joshua Ramirez  has presented today for surgery, with the diagnosis of HIATAL HERNIA  The various methods of treatment have been discussed with the patient and family. After consideration of risks, benefits and other options for treatment, the patient has consented to  Procedure(s): ROBOTIC ASSISTED LAPAROSCOPIC NISSEN FUNDOPLICATION WITH HIATAL HERNIA REPAIR (N/A) HERNIA REPAIR UMBILICAL ADULT (N/A) as a surgical intervention .  The patient's history has been reviewed, patient examined, no change in status, stable for surgery.  I have reviewed the patient's chart and labs.  Questions were answered to the patient's satisfaction.     Abygayle Deltoro F Jaman Aro

## 2018-03-03 NOTE — Anesthesia Preprocedure Evaluation (Signed)
Anesthesia Evaluation  Patient identified by MRN, date of birth, ID band Patient awake    Reviewed: Allergy & Precautions, NPO status , Patient's Chart, lab work & pertinent test results, reviewed documented beta blocker date and time   Airway Mallampati: III  TM Distance: >3 FB     Dental  (+) Chipped   Pulmonary sleep apnea ,    Pulmonary exam normal        Cardiovascular Normal cardiovascular exam     Neuro/Psych    GI/Hepatic GERD  Controlled,  Endo/Other    Renal/GU      Musculoskeletal  (+) Arthritis ,   Abdominal (+) + obese,   Peds  Hematology   Anesthesia Other Findings Obese.  Reproductive/Obstetrics                             Anesthesia Physical  Anesthesia Plan  ASA: III  Anesthesia Plan: General   Post-op Pain Management:    Induction: Intravenous  PONV Risk Score and Plan:   Airway Management Planned: Oral ETT  Additional Equipment:   Intra-op Plan:   Post-operative Plan: Extubation in OR  Informed Consent: I have reviewed the patients History and Physical, chart, labs and discussed the procedure including the risks, benefits and alternatives for the proposed anesthesia with the patient or authorized representative who has indicated his/her understanding and acceptance.     Plan Discussed with: CRNA  Anesthesia Plan Comments:         Anesthesia Quick Evaluation

## 2018-03-03 NOTE — Anesthesia Post-op Follow-up Note (Signed)
Anesthesia QCDR form completed.        

## 2018-03-03 NOTE — Anesthesia Procedure Notes (Signed)
Procedure Name: Intubation Performed by: Lia Foyer, RN Pre-anesthesia Checklist: Patient identified, Emergency Drugs available, Suction available, Patient being monitored and Timeout performed Patient Re-evaluated:Patient Re-evaluated prior to induction Oxygen Delivery Method: Circle system utilized Preoxygenation: Pre-oxygenation with 100% oxygen Induction Type: IV induction Ventilation: Oral airway inserted - appropriate to patient size and Two handed mask ventilation required Laryngoscope Size: Mac and 4 Grade View: Grade II Tube type: Oral Number of attempts: 1 Airway Equipment and Method: Patient positioned with wedge pillow and Stylet Placement Confirmation: ETT inserted through vocal cords under direct vision,  positive ETCO2 and breath sounds checked- equal and bilateral Secured at: 21.5 cm Tube secured with: Tape Dental Injury: Teeth and Oropharynx as per pre-operative assessment  Future Recommendations: Recommend- induction with short-acting agent, and alternative techniques readily available

## 2018-03-03 NOTE — Op Note (Signed)
Robotic assisted Nissen fundoplication with hiatal hernia repair and umbilical hernia repair  Pre-operative Diagnosis: Recalcitrant reflux and small sliding hiatal hernia  Post-operative Diagnosis: Same   Surgeon: Sterling Big, MD FACS  Anesthesia: Gen. with endotracheal tube   Findings: Small sliding hiatal hernia 1.5 cm UMBILICAL HERNIA Fatty and enlarged liver Floppy fundoplication over a 50 French bougie  Estimated Blood Loss: 50cc         Drains: none         Specimens: Gallbladder           Complications: none   Procedure Details  The patient was seen again in the Holding Room. The benefits, complications, treatment options, and expected outcomes were discussed with the patient. The risks of bleeding, infection, recurrence of symptoms, failure to resolve symptoms, esophageal damage, , bowel injury, any of which could require further surgery  were reviewed with the patient. The likelihood of improving the patient's symptoms with return to their baseline status is good.  The patient and/or family concurred with the proposed plan, giving informed consent.  The patient was taken to Operating Room, identified as Joshua Ramirez and the procedure verified .  A Time Out was held and the above information confirmed.  Prior to the induction of general anesthesia, antibiotic prophylaxis was administered. VTE prophylaxis was in place. General endotracheal anesthesia was then administered and tolerated well. After the induction, the abdomen was prepped with Chloraprep and draped in the sterile fashion. The patient was positioned in the supine position. Incision was created with a 15 blade knife and hernia sac was encountered and dissected free from adjacent structure.Cut down technique was used to enter the abdominal cavity, and a Hasson trochar was placed.  Four additional robotic ports were placed under direct visualization and subxiphoid defect was created for a Nathanson retractor that  was placed in the standard fashion.  Patient was placed in a steep reverse Trendelenburg position and the robot was brought into the surgical field and docked in the standard fashion.  We make sure that there was no collisions between the arms and all the instruments where visualized at all times.  Started dissection along the pars flaccida and using the vessel sealer were able to divide some of the gastrohepatic ligament until we find the right crew.  Attention then was turned to the right and left crew that we developed and dissected the plane.  Please note that there was a enlarged liver that made dissection difficult.  Attention then was turned to the anterior aspect of the gastroesophageal junction we continue our dissection circumferentially.  We reduce the hernia sac from the hiatal hernia and were able to pull back all of the stomach to the intra-abdominal position. Then retracted the greater omentum and divided the short gastrics with the vessel sealer in the standard fashion.  We were able to reach the fundus and also the left crew in the standard fashion.  We have nice adequate circumferential mobilization of the GE junction and the esophagus.  We identified the posterior anterior vagus nerve and preserve them at all times.  The hiatal hernia was repair by approximating the crus with a running 0V lock suture. We asked anesthesia to place a 50 bougie that when he easily.  Using 3 interrupted 2-0 silk sutures we created at good 4 cm fundoplication in the standard fashion.  The fundoplication was floppy and there was no evidence of tension. I was very happy with the procedure.  Inspection of the  upper quadrant was performed. No bleeding, bile duct injury or leak, or bowel injury was noted. The robot was undocked in the standard fashion and the instruments, retractors and needles were removed.   Pneumoperitoneum was released.  The umbilical defct  was closed with interrumpted 0 ethibond sutures.  4-0 subcuticular Monocryl was used to close the skin. Dermabond was  applied.  The patient was then extubated and brought to the recovery room in stable condition. Sponge, lap, and needle counts were correct at closure and at the conclusion of the case.               Sterling Big, MD, FACS

## 2018-03-04 DIAGNOSIS — K449 Diaphragmatic hernia without obstruction or gangrene: Secondary | ICD-10-CM | POA: Diagnosis not present

## 2018-03-04 MED ORDER — OXYCODONE HCL 5 MG PO TABS: 5.0000 mg | ORAL_TABLET | Freq: Four times a day (QID) | ORAL | 0 refills | Status: DC | PRN

## 2018-03-04 NOTE — Progress Notes (Addendum)
Joshua Ramirez to be D/C'd home per MD order.  Discussed prescriptions and follow up appointments with the patient. Prescriptions given to patient, medication list explained in detail. Printed diet information included and reviewed with patient. Pt verbalized understanding.  Allergies as of 03/04/2018   No Known Allergies     Medication List    STOP taking these medications   pantoprazole 40 MG tablet Commonly known as:  PROTONIX     TAKE these medications   nabumetone 750 MG tablet Commonly known as:  RELAFEN Take 750 mg by mouth 2 (two) times daily.   Olopatadine HCl 0.2 % Soln Place 1 drop into both eyes daily as needed (runny eyes).   oxyCODONE 5 MG immediate release tablet Commonly known as:  Oxy IR/ROXICODONE Take 1 tablet (5 mg total) by mouth every 6 (six) hours as needed for severe pain.   tadalafil 5 MG tablet Commonly known as:  CIALIS Take 1 tablet (5 mg total) by mouth daily as needed for erectile dysfunction.       Vitals:   03/04/18 0426 03/04/18 1400  BP: 121/80 (!) 141/92  Pulse: 87 97  Resp: 20 14  Temp: 97.7 F (36.5 C) 98 F (36.7 C)  SpO2: 94% 95%    Skin clean, dry and intact without evidence of skin break down, no evidence of skin tears noted. IV catheter discontinued intact. Site without signs and symptoms of complications. Dressing and pressure applied. Pt denies pain at this time. No complaints noted.  An After Visit Summary was printed and given to the patient. . Patient will be discharged once ride arrives  Joshua Ramirez A Keeley Sussman

## 2018-03-04 NOTE — Progress Notes (Signed)
[facility] Progress Note  1 Day Post-Op  Subjective: POD1. He is doing well with minimal abdominal soreness. He notes feeling like he needs to burp but is unable to. He reports that he would previously have reflux every morning, but has had none this morning. Tolerating clears.   Objective: Vital signs in last 24 hours: Temp:  [97.4 F (36.3 C)-98.7 F (37.1 C)] 97.7 F (36.5 C) (10/11 0426) Pulse Rate:  [85-102] 87 (10/11 0426) Resp:  [10-26] 20 (10/11 0426) BP: (113-161)/(64-83) 121/80 (10/11 0426) SpO2:  [90 %-100 %] 94 % (10/11 0426) Last BM Date: 03/02/18  Intake/Output from previous day: 10/10 0701 - 10/11 0700 In: 3389.3 [P.O.:480; I.V.:2909.3] Out: 975 [Urine:925; Blood:50] Intake/Output this shift: Total I/O In: 115.7 [I.V.:115.7] Out: -   PE: Gen:  Alert, NAD, pleasant Card:  Regular rate and rhythm Pulm:  Normal effort Abd: Soft, non-tender, non-distended, laparoscopic incisions are CDI, no erythema of drainage.  Skin: warm and dry, no rashes  Psych: A&Ox3   Lab Results:  No results for input(s): WBC, HGB, HCT, PLT in the last 72 hours. BMET No results for input(s): NA, K, CL, CO2, GLUCOSE, BUN, CREATININE, CALCIUM in the last 72 hours. PT/INR No results for input(s): LABPROT, INR in the last 72 hours. CMP     Component Value Date/Time   NA 139 06/17/2017 1043   K 4.3 06/17/2017 1043   CL 102 06/17/2017 1043   CO2 31 06/17/2017 1043   GLUCOSE 95 06/17/2017 1043   BUN 16 06/17/2017 1043   CREATININE 1.31 06/17/2017 1043   CALCIUM 9.6 06/17/2017 1043   PROT 7.5 06/17/2017 1043   ALBUMIN 4.2 06/17/2017 1043   AST 25 06/17/2017 1043   ALT 35 06/17/2017 1043   ALKPHOS 74 06/17/2017 1043   BILITOT 0.6 06/17/2017 1043   Lipase  No results found for: LIPASE     Studies/Results: No results found.  Anti-infectives: Anti-infectives (From admission, onward)   Start     Dose/Rate Route Frequency Ordered Stop   03/03/18 0600   ceFAZolin (ANCEF) 3 g in dextrose 5 % 50 mL IVPB     3 g 100 mL/hr over 30 Minutes Intravenous On call to O.R. 03/02/18 2227 03/03/18 0805       Assessment/Plan  Hiatal Hernia - POD1, doing well, minimal abdominal pain and tolerating a diet.  - Will advance to full liquids this morning - D/C IVF - Pain Control as need - Stop PPI - Mobilize - DVT Prophylaxis  - Likely home this afternoon   LOS: 0 days    Lynden Oxford , PA-C [facility] 03/04/2018, 10:43 AM [phone] M-F: 7am - 4pm

## 2018-03-04 NOTE — Anesthesia Postprocedure Evaluation (Signed)
Anesthesia Post Note  Patient: Joshua Ramirez  Procedure(s) Performed: ROBOTIC ASSISTED LAPAROSCOPIC NISSEN FUNDOPLICATION WITH HIATAL HERNIA REPAIR (N/A ) HERNIA REPAIR UMBILICAL ADULT (N/A )  Patient location during evaluation: PACU Anesthesia Type: General Level of consciousness: awake and alert and oriented Pain management: pain level controlled Vital Signs Assessment: post-procedure vital signs reviewed and stable Respiratory status: spontaneous breathing Cardiovascular status: blood pressure returned to baseline Anesthetic complications: no     Last Vitals:  Vitals:   03/03/18 2059 03/04/18 0426  BP: (!) 161/75 121/80  Pulse: 93 87  Resp: 20 20  Temp: 37.1 C 36.5 C  SpO2: 97% 94%    Last Pain:  Vitals:   03/04/18 0426  TempSrc: Oral  PainSc:                  Joshua Ramirez

## 2018-03-04 NOTE — Discharge Summary (Signed)
Discharge Summary  Patient ID: Joshua Ramirez MRN: [id number] DOB/AGE: [birth year] 42 y.o.  Admit date: 03/03/2018 Discharge date: 03/04/2018  Discharge Diagnoses Patient Active Problem List   Diagnosis Date Noted  . S/P Nissen fundoplication (without gastrostomy tube) procedure 03/03/2018  . Right knee pain 12/23/2017  . OSA on CPAP 09/06/2017  . GERD (gastroesophageal reflux disease) 09/06/2017  . Chronic pain of right knee 06/20/2017  . Umbilical hernia without obstruction and without gangrene 06/20/2017  . H. pylori infection 06/20/2017  . Hyperlipidemia 06/20/2017  . Obesity (BMI 35.0-39.9 without comorbidity) 06/20/2017    Consultants None  Procedures Robotic assisted Nissen fundoplication with hiatal hernia repair and umbilical hernia repair on 03/03/18 with Dr. Everlene Farrier  HPI: Joshua Ramirez is a 42 y.o. male who presented on 10/10 for scheduled elective Robotic assisted Nissen fundoplication with hiatal hernia repair and umbilical hernia repair with Dr. Everlene Farrier.   Hospital Course: He was observed overnight for post-operative pain and restarting diet which he did so uneventfully. On the day of discharge (03/04/18), he was tolerating a diet, his pain was adequately controlled, and he was mobilizing well. Will provide him with dietary recommendations going forward. He will follow up in 2 weeks with Dr. Everlene Farrier,     Allergies as of 03/04/2018   No Known Allergies     Medication List    STOP taking these medications   pantoprazole 40 MG tablet Commonly known as:  PROTONIX     TAKE these medications   nabumetone 750 MG tablet Commonly known as:  RELAFEN Take 750 mg by mouth 2 (two) times daily.   Olopatadine HCl 0.2 % Soln Place 1 drop into both eyes daily as needed (runny eyes).   oxyCODONE 5 MG immediate release tablet Commonly known as:  Oxy IR/ROXICODONE Take 1 tablet (5 mg total) by mouth every 6 (six) hours as needed for severe pain.   tadalafil 5 MG  tablet Commonly known as:  CIALIS Take 1 tablet (5 mg total) by mouth daily as needed for erectile dysfunction.        Follow-up Information    Pabon, [place] F, MD. Schedule an appointment as soon as possible for a visit in 2 week(s).   Specialty:  General Surgery Why:  s/p nissen on 10/10 with Dr. Leonides Grills information: [address] [place] [address] [phone]           Signed: Lynden Oxford , PA-C [facility]  03/04/2018, 2:20 PM [phone] M-F: 7am - 4pm

## 2018-03-16 ENCOUNTER — Encounter: Payer: Self-pay | Admitting: Surgery

## 2018-03-16 ENCOUNTER — Other Ambulatory Visit: Payer: Self-pay

## 2018-03-16 ENCOUNTER — Ambulatory Visit (INDEPENDENT_AMBULATORY_CARE_PROVIDER_SITE_OTHER): Payer: BC Managed Care – PPO | Admitting: Surgery

## 2018-03-16 VITALS — BP 118/66 | HR 102 | Temp 97.7°F | Resp 14 | Ht 72.0 in | Wt 256.0 lb

## 2018-03-16 DIAGNOSIS — Z09 Encounter for follow-up examination after completed treatment for conditions other than malignant neoplasm: Secondary | ICD-10-CM

## 2018-03-16 NOTE — Patient Instructions (Signed)
GENERAL POST-OPERATIVE PATIENT INSTRUCTIONS   WOUND CARE INSTRUCTIONS:  Keep a dry clean dressing on the wound if there is drainage. The initial bandage may be removed after 24 hours.  Once the wound has quit draining you may leave it open to air.  If clothing rubs against the wound or causes irritation and the wound is not draining you may cover it with a dry dressing during the daytime.  Try to keep the wound dry and avoid ointments on the wound unless directed to do so.  If the wound becomes bright red and painful or starts to drain infected material that is not clear, please contact your physician immediately.  If the wound is mildly pink and has a thick firm ridge underneath it, this is normal, and is referred to as a healing ridge.  This will resolve over the next 4-6 weeks.  BATHING: You may shower if you have been informed of this by your surgeon. However, Please do not submerge in a tub, hot tub, or pool until incisions are completely sealed or have been told by your surgeon that you may do so.  DIET:  You may eat any foods that you can tolerate.  It is a good idea to eat a high fiber diet and take in plenty of fluids to prevent constipation.  If you do become constipated you may want to take a mild laxative or take ducolax tablets on a daily basis until your bowel habits are regular.  Constipation can be very uncomfortable, along with straining, after recent surgery.  ACTIVITY:  You are encouraged to cough and deep breath or use your incentive spirometer if you were given one, every 15-30 minutes when awake.  This will help prevent respiratory complications and low grade fevers post-operatively if you had a general anesthetic.  You may want to hug a pillow when coughing and sneezing to add additional support to the surgical area, if you had abdominal or chest surgery, which will decrease pain during these times.  You are encouraged to walk and engage in light activity for the next two weeks.  You  should not lift more than 20 pounds, 4 more weeks as it could put you at increased risk for complications.  Twenty pounds is roughly equivalent to a plastic bag of groceries. At that time- Listen to your body when lifting, if you have pain when lifting, stop and then try again in a few days. Soreness after doing exercises or activities of daily living is normal as you get back in to your normal routine.  MEDICATIONS:  Try to take narcotic medications and anti-inflammatory medications, such as tylenol, ibuprofen, naprosyn, etc., with food.  This will minimize stomach upset from the medication.  Should you develop nausea and vomiting from the pain medication, or develop a rash, please discontinue the medication and contact your physician.  You should not drive, make important decisions, or operate machinery when taking narcotic pain medication.  SUNBLOCK Use sun block to incision area over the next year if this area will be exposed to sun. This helps decrease scarring and will allow you avoid a permanent darkened area over your incision.  QUESTIONS:  Please feel free to call our office if you have any questions, and we will be glad to assist you. [phone]     Laparoscopic Nissen Fundoplication Laparoscopic Nissen fundoplication is surgery to relieve heartburn and other problems caused by gastric fluids flowing up into your esophagus. The esophagus is the tube that carries  food and liquid from your throat to your stomach. Normally, the muscle that sits between your stomach and your esophagus (lower esophageal sphincter or LES) keeps stomach fluids in your stomach. In some people, the LES does not work properly, and stomach fluids flow up into the esophagus. This can happen when part of the stomach bulges through the LES (hiatal hernia). The backward flow of stomach fluids can cause a type of severe and long-standing heartburn that is called gastroesophageal reflux disease (GERD). You may need this  surgery if other treatments for GERD have not helped. In a laparoscopic Nissen fundoplication, the upper part of your stomach is wrapped around the lower part of your esophagus to strengthen the LES and prevent reflux. If you have a hiatal hernia, it will also be repaired with this surgery. The procedure is done through several small incisions in your abdomen. It is performed using a thin, telescopic instrument (laparoscope) and other instruments that can pass through the scope or through other small incisions. Tell a health care provider about:  Any allergies you have.  All medicines you are taking, including vitamins, herbs, eye drops, creams, and over-the-counter medicines.  Any problems you or family members have had with anesthetic medicines.  Any blood disorders you have.  Any surgeries you have had.  Any medical conditions you have. What are the risks? Generally, this is a safe procedure. However, problems may occur, including:  Difficulty swallowing (dysphagia).  Bloating.  Nausea or vomiting.  Damage to the lung, causing a collapsed lung.  Infection or bleeding. What happens before the procedure?  Ask your health care provider about:  Changing or stopping your regular medicines. This is especially important if you are taking diabetes medicines or blood thinners.  Taking medicines such as aspirin and ibuprofen. These medicines can thin your blood. Do not take these medicines before your procedure if your health care provider asks you not to.  Follow your health care provider's instructions about eating or drinking restrictions.  Plan to have someone take you home after the procedure. What happens during the procedure?  An IV tube will be inserted into one of your veins. It will be used to give you fluids and medicines during the procedure.  You will be given a medicine that makes you fall asleep (general anesthetic).  Your abdomen will be cleaned with a  germ-killing solution (antiseptic).  The surgeon will make a small incision in your abdomen and insert a tube through the incision.  Your abdomen will be filled with a gas. This helps the surgeon to see your organs more easily and it makes more space to work.  The surgeon will insert the laparoscope through the incision. The scope has a camera that will send pictures to a monitor in the operating room.  The surgeon will make several other small incisions in your abdomen to insert the other instruments that are needed during the procedure.  Another instrument (dilator) will be passed through your mouth and down your esophagus into the upper part of your stomach. The dilator will prevent your LES from being closed too tightly during surgery.  The surgeon will pass the top portion of your stomach behind the lower part of your esophagus and wrap it all the way around. This will be stitched into place.  If you have a hiatal hernia, it will be repaired during this procedure.  All instruments will be removed, and your incisions will be closed under your skin with stitches (sutures). Skin  adhesive strips may also be used.  A bandage (dressing) will be placed on your skin over the incisions. The procedure may vary among health care providers and hospitals. What happens after the procedure?  You will be moved to a recovery area.  Your blood pressure, heart rate, breathing rate, and blood oxygen level will be monitored often until the medicines you were given have worn off.  You will be given pain medicine as needed.  Your IV tube will be kept in until you are able to drink fluids. This information is not intended to replace advice given to you by your health care provider. Make sure you discuss any questions you have with your health care provider. Document Released: 06/01/2014 Document Revised: 10/17/2015 Document Reviewed: 01/10/2014 Elsevier Interactive Patient Education  2017 [employer]   Laparoscopic Nissen Fundoplication, Care After Refer to this sheet in the next few weeks. These instructions provide you with information about caring for yourself after your procedure. Your health care provider may also give you more specific instructions. Your treatment has been planned according to current medical practices, but problems sometimes occur. Call your health care provider if you have any problems or questions after your procedure. What can I expect after the procedure? After the procedure, it is common to have:  Difficulty swallowing (dysphagia).  Excess gas (bloating). Follow these instructions at home: Medicines   Take medicines only as directed by your health care provider.  Do not drive or operate heavy machinery while taking pain medicine. Incision care   There are many different ways to close and cover an incision, including stitches (sutures), skin glue, and adhesive strips. Follow your health care provider's instructions about:  Incision care.  Bandage (dressing) changes and removal.  Incision closure removal.  Check your incision areas every day for signs of infection. Watch for:  Redness, swelling, or pain.  Fluid, blood, or pus.  Do not take baths, swim, or use a hot tub until your health care provider approves. Take showers as directed by your health care provider. Eating and drinking   Follow your health care provider's instructions about eating.  You may need to follow a liquid-only diet for 2 weeks, followed by a diet of soft foods for 2 weeks.  You should return to your usual diet gradually.  Drink enough fluid to keep your urine clear or pale yellow. Activity   Return to your normal activities as directed by your health care provider. Ask your health care provider what activities are safe for you.  Avoid strenuous exercise.  Do not lift anything that is heavier than 10 lb (4.5 kg).  Ask your health care provider when you  can:  Return to sexual activity.  Drive.  Go back to work. Contact a health care provider if:  You have a fever.  Your pain gets worse or is not helped by medicine.  You have frequent nausea or vomiting.  You have continued abdominal bloating.  You have an ongoing (persistent) cough.  You have redness, swelling, or pain in any incision areas.  You have fluid, blood, or pus coming from any incisions. Get help right away if:  You have trouble breathing.  You are unable to swallow.  You have persistent vomiting.  You have blood in your vomit.  You have severe abdominal pain. This information is not intended to replace advice given to you by your health care provider. Make sure you discuss any questions you have with your health care  provider. Document Released: 01/02/2004 Document Revised: 10/17/2015 Document Reviewed: 01/10/2014 Elsevier Interactive Patient Education  2017 [employer]   Diet After Nissen Fundoplication Surgery This diet information is for patients who have recently had Nissen fundoplication surgery to correct reflux disease or to repair various types of hernias, such as hiatal hernia and intrathoracic stomach. This diet may also be used for other gastrointestinal surgeries, such as Heller myotomy and repair of achalasia. The diet will help control diarrhea, excess gas and swallowing problems, which may occur after this type of surgery. Keeping Your Stomach from Stretching Eat small, frequent meals (six to eight per day). This will help you consume the majority of the nutrients you need without causing your stomach to feel full or distended.  Drinking large amounts of fluids with meals can stretch your stomach. You may drink fluids between meals as often as you like, but limit fluids to 1/2 cup (4 fluid ounces) with meals and one cup (8 fluid ounces) with snacks.  Sit upright while eating and stay upright for 30 minutes after each meal. Gravity can help  food move through your digestive tract. Do not lie down after eating. Sit upright for 2 hours after your last meal or snack of the day.  Eat very slowly. Take your time when eating.  Take small bites and chew your food well to help aid in swallowing and digestion.  Avoid crusty breads and sticky, gummy foods, such as bananas, fresh doughy breads, rolls and doughnuts. These types of foods become sticky and difficult to swallow.  Toasted breads tend to be better tolerated.  Lastly, if you eat sweets, consume them at the end of your meal to avoid a group of symptoms referred to as "dumping syndrome". This describes the rapid emptying of foods from the stomach to the small intestine. Sweetened beverages, candy and desserts move more rapidly and dump quickly into the intestines. This can cause symptoms of nausea, weakness, cold sweats, cramps, diarrhea and dizzy spells.  Avoiding Gas Avoid drinking through a straw. Do not chew gum or tobacco. These actions cause you to swallow air, which produces excess gas in your stomach. Chew with your mouth closed.  Avoid any foods that cause stomach gas and distention. These foods include corn, dried beans, peas, lentils, onions, broccoli, cauliflower and any food from the cabbage family.  Avoid carbonated drinks, alcohol, citrus and tomato products.  When will I be able to eat a soft diet? After Nissen fundoplication surgery, your diet will be advanced slowly by your surgeon. Generally, you will be on a clear liquid diet for the first few meals. Then you will advance to the full liquid diet for a meal or two and eventually to a Nissen soft diet. Please be aware that each patient's tolerance to food is different. Your doctor will advance your diet depending on how well you progress after surgery. Clear Liquid Diet  The first diet after surgery is the clear liquid diet. It includes the following liquids: Apple juice  Cranberry juice  Grape juice  Chicken broth   Beef broth  Flavored gelatin (Jell-O)  Decaf tea and coffee  Caffeinated beverages are permitted based on tolerance  Popsicles  [place] ice Carbonated drinks (sodas) are not allowed for the first six to eight weeks after surgery. After this time you can try them again in small amounts.  Full Liquid Diet The full liquid diet contains anything on the clear liquid diet, plus: Milk, soy, rice and almond (no chocolate)  Cream of wheat, cream of rice, grits  Strained creamed soups (no tomato or broccoli)  Vanilla and strawberry-flavored ice cream  Sherbet  Blended, custard styled or whipped yogurt (plain or vanilla only)  Vanilla and butterscotch pudding (no chocolate or coconut)  Nutritional drinks including Ensure, Boost, Carnation Instant Breakfast (no chocolate-flavored) Note: Dairy products, such as milk, ice cream and pudding, may cause diarrhea in some people just after surgery. You may need to avoid milk products. If so, substitute them with lactose-free beverages, such as soy, rice, Lactaid or almond milks.  Nissen Soft Diet Food Category Foods to Choose Foods to Avoid  Beverages Milk, such as, whole, 2%, 1%, non-fat, or skim, soy, rice, almond  Caffeinated and decaf tea and coffee  Powdered drink mixes (in moderation)  Non-citrus juices (apple, grape, cranberry or blends of these)  Fruit nectars  Nutritional drinks including Boost, Ensure, Carnation Instant Breakfast Chocolate milk, cocoa or other chocolate-flavored drinks  Carbonated drinks  Alcohol  Citrus juices like orange, grapefruit, lemon and lime  Breads Pancakes, [place] toast and waffles  Crackers (saltine, butter, soda, graham, Goldfish and Cheese Nips)  Toasted bread Untoasted bread, bagels, [place] and hard rolls, English muffins  Crackers with nuts, seeds, fresh or dried fruit, coconut, or highly seasoned, such as garlic or onion-flavored  Sweet rolls, coffee cake or doughnuts  Cereals Well cooked  cereals, such as oatmeal (plain or flavored)  Cold cereal (Cornflakes, Rice Krispies, Cheerios, Special K plain, Rice Chex and puffed rice) Very coarse cereal, such as bran, shredded wheat  Any cereal with fresh or dried fruit, coconut, seeds or nuts  Desserts Eat in moderation and do not eat desserts or sweets by themselves. Plain cakes, cookies and cream-filled pies  Vanilla and butterscotch pudding or custard  Ice cream, ice milk, frozen yogurt and sherbet  Gelatin made from allowed foods  Fruit ices and popsicles Desserts containing chocolate, coconut, nuts, seeds, fresh or dried fruit, peppermint or spearmint  Eggs  Poached, hard boiled or scrambled Fried eggs and highly seasoned eggs (deviled eggs)  Fats Eat in moderation. Butter and margarine  Mayonnaise and vegetable oils  Mildly seasoned cream sauces and gravies  Plain cream cheese  Sour cream Highly seasoned salad dressings, cream sauces and gravies  Bacon, bacon fat, ham fat, lard and salt pork  Fried foods  Nuts  Fruits Fruit juice  Any canned or cooked fruit except those listed in the AVOID column ALL fresh fruits, such as citrus, bananas and pineapple  Canned pineapple  Dried fruits, such as raisins, berries  Fruits with seeds, such as berries, kiwi and figs  Meat, Fish, Poultry, and [employer] may be ground, minced or chopped to ease swallowing and digestion  Tender, well cooked and moist cuts of beef, chicken, [place] and pork  Veal and lamb  Flaky, cooked fish  Canned tuna  Cottage and ricotta cheeses  Mild cheese, such as American, brick, mozzarella and baby Swiss  Creamy peanut butter  Plain custard or blended fruit yogurt  Moist casseroles, such as macaroni & cheese, tuna noodle  Grilled or toasted cheese sandwich Tough meats with a lot of gristle  Fried, highly seasoned, smoked and fatty meat, fish or poultry, such as frankfurters, luncheon meats, sausage, bacon, spare ribs, beef brisket,  sardines, anchovies, duck and goose  Chili and other entrees made with pepper or chili pepper  Shellfish  Strongly flavored cheeses, such as sharp cheese, extra sharp cheddar, cheese containing peppers or other  seasonings  Crunchy peanut butter  Any yogurt with nuts, seeds, coconut, strawberries or raspberries  Potatoes and Starches Peeled, mashed or boiled white or sweet potatoes  Oven-baked potatoes without skin  Well cooked white rice, enriched noodles, barley, spaghetti, macaroni and other pastas Fried potatoes, potato skins and potato chips  Hard and soft taco shells  Fried, brown or wild rice  Soups Mildly flavored meat stocks  Cream soups made from allowed foods Highly seasoned soups and tomato based soups, cream soups made with gas producing vegetables, such as broccoli, cauliflower, onion, etc.  Sweets and Snacks Use in moderation and do not eat large amounts of sweets by themselves. Syrup, honey, jelly and seedless jam  Plain hard candies and plain candies made with allowed ingredients  Molasses  Marshmallows  Other candy made from allowed ingredients  Thin pretzels Jam, marmalade and preserves  Chocolate in any form  Any candy containing nuts, coconut, seeds, peppermint, spearmint or dried or fresh fruit  Popcorn, potato chips, tortilla chips  Soft or hard thick pretzels, such as sourdough  Vegetables Well cooked soft vegetables without seeds or skins, such as asparagus tips, beets, carrots, green and wax beans, chopped spinach, tender canned baby peas, squash and pumpkin Raw vegetables, tomatoes, tomato juice, tomato sauce and V-8 juice  Gas producing vegetables, such as broccoli, Brussel sprouts, cabbage, cauliflower, onions, corn, cucumber, green peppers, rutabagas, turnips, radishes and sauerkraut  Dried beans, peas and lentils  Miscellaneous Salt and spices in moderation  Mustard and vinegar in moderation Fried or highly seasoned foods  Coconut and seeds  Pickles and  olives  Chili sauces, ketchup, barbecue sauce, horseradish, black pepper, chili powder and onion and garlic seasonings  Any other strongly flavored seasoning, condiment, spice or herb not tolerated  Any food not tolerated

## 2018-03-16 NOTE — Progress Notes (Signed)
S/p Joshua Ramirez Doing very well Taking PO now on soft diet No Dysphagia, no reflux Epigastric pain from preop is gone  PE NAD Abd: soft, ntm, incisions healing well, no infection  A/ p Doing well No complications Off PPI Instructed about post Ramirez Diet No heavy lifting

## 2018-04-27 ENCOUNTER — Telehealth: Payer: Self-pay | Admitting: Surgery

## 2018-04-27 NOTE — Telephone Encounter (Signed)
Patient is calling and is asking for someone to call him with information on what foods he can have to eat or when he can start eating other types of food. Please call patient and advise.

## 2018-04-27 NOTE — Telephone Encounter (Signed)
Advised patient he can eat normal foods again . Just be careful and take his time. Moderation is the key. Patient understands.

## 2018-06-24 ENCOUNTER — Encounter: Payer: Self-pay | Admitting: Internal Medicine

## 2018-06-24 ENCOUNTER — Other Ambulatory Visit: Payer: Self-pay | Admitting: Internal Medicine

## 2018-06-24 ENCOUNTER — Ambulatory Visit: Payer: BC Managed Care – PPO | Admitting: Internal Medicine

## 2018-06-24 ENCOUNTER — Other Ambulatory Visit (HOSPITAL_COMMUNITY)
Admission: RE | Admit: 2018-06-24 | Discharge: 2018-06-24 | Disposition: A | Discharge status: Home / Self Care | Payer: BC Managed Care – PPO | Source: Ambulatory Visit | Attending: Internal Medicine | Admitting: Internal Medicine

## 2018-06-24 VITALS — BP 128/80 | HR 94 | Temp 98.2°F | Resp 18 | Ht 72.0 in | Wt 244.0 lb

## 2018-06-24 DIAGNOSIS — F32A Depression, unspecified: Secondary | ICD-10-CM

## 2018-06-24 DIAGNOSIS — Z0000 Encounter for general adult medical examination without abnormal findings: Secondary | ICD-10-CM | POA: Diagnosis not present

## 2018-06-24 DIAGNOSIS — Z113 Encounter for screening for infections with a predominantly sexual mode of transmission: Secondary | ICD-10-CM

## 2018-06-24 DIAGNOSIS — F329 Major depressive disorder, single episode, unspecified: Secondary | ICD-10-CM | POA: Insufficient documentation

## 2018-06-24 DIAGNOSIS — E559 Vitamin D deficiency, unspecified: Secondary | ICD-10-CM

## 2018-06-24 DIAGNOSIS — Z1159 Encounter for screening for other viral diseases: Secondary | ICD-10-CM

## 2018-06-24 DIAGNOSIS — F419 Anxiety disorder, unspecified: Secondary | ICD-10-CM | POA: Insufficient documentation

## 2018-06-24 DIAGNOSIS — Z1329 Encounter for screening for other suspected endocrine disorder: Secondary | ICD-10-CM | POA: Diagnosis not present

## 2018-06-24 DIAGNOSIS — Z0184 Encounter for antibody response examination: Secondary | ICD-10-CM | POA: Diagnosis not present

## 2018-06-24 DIAGNOSIS — F411 Generalized anxiety disorder: Secondary | ICD-10-CM | POA: Diagnosis not present

## 2018-06-24 DIAGNOSIS — N485 Ulcer of penis: Secondary | ICD-10-CM

## 2018-06-24 DIAGNOSIS — E785 Hyperlipidemia, unspecified: Secondary | ICD-10-CM

## 2018-06-24 HISTORY — DX: Depression, unspecified: F32.A

## 2018-06-24 HISTORY — DX: Anxiety disorder, unspecified: F41.9

## 2018-06-24 LAB — CBC WITH DIFFERENTIAL/PLATELET
BASOS PCT: 0.9 % (ref 0.0–3.0)
Basophils Absolute: 0.1 10*3/uL (ref 0.0–0.1)
EOS PCT: 10.4 % — AB (ref 0.0–5.0)
Eosinophils Absolute: 0.6 10*3/uL (ref 0.0–0.7)
HCT: 46.1 % (ref 39.0–52.0)
Hemoglobin: 14.9 g/dL (ref 13.0–17.0)
LYMPHS ABS: 2.1 10*3/uL (ref 0.7–4.0)
Lymphocytes Relative: 33.4 % (ref 12.0–46.0)
MCHC: 32.4 g/dL (ref 30.0–36.0)
MCV: 84.2 fl (ref 78.0–100.0)
MONO ABS: 0.7 10*3/uL (ref 0.1–1.0)
Monocytes Relative: 11.9 % (ref 3.0–12.0)
NEUTROS PCT: 43.4 % (ref 43.0–77.0)
Neutro Abs: 2.7 10*3/uL (ref 1.4–7.7)
PLATELETS: 248 10*3/uL (ref 150.0–400.0)
RBC: 5.48 Mil/uL (ref 4.22–5.81)
RDW: 16.2 % — ABNORMAL HIGH (ref 11.5–15.5)
WBC: 6.2 10*3/uL (ref 4.0–10.5)

## 2018-06-24 LAB — COMPREHENSIVE METABOLIC PANEL
ALK PHOS: 84 U/L (ref 39–117)
ALT: 14 U/L (ref 0–53)
AST: 17 U/L (ref 0–37)
Albumin: 4.1 g/dL (ref 3.5–5.2)
BUN: 18 mg/dL (ref 6–23)
CHLORIDE: 100 meq/L (ref 96–112)
CO2: 29 meq/L (ref 19–32)
Calcium: 9.6 mg/dL (ref 8.4–10.5)
Creatinine, Ser: 1.22 mg/dL (ref 0.40–1.50)
GFR: 77.99 mL/min (ref >60.00)
GLUCOSE: 94 mg/dL (ref 70–99)
POTASSIUM: 4.2 meq/L (ref 3.5–5.1)
Sodium: 135 mEq/L (ref 135–145)
TOTAL PROTEIN: 7.4 g/dL (ref 6.0–8.3)
Total Bilirubin: 0.5 mg/dL (ref 0.2–1.2)

## 2018-06-24 LAB — VITAMIN D 25 HYDROXY (VIT D DEFICIENCY, FRACTURES): VITD: 14.68 ng/mL — ABNORMAL LOW (ref 30.00–100.00)

## 2018-06-24 LAB — TSH: TSH: 1.58 u[IU]/mL (ref 0.35–4.50)

## 2018-06-24 MED ORDER — ALPRAZOLAM 0.25 MG PO TABS: 0.2500 mg | ORAL_TABLET | Freq: Every day | ORAL | 0 refills | Status: DC | PRN

## 2018-06-24 MED ORDER — CHOLECALCIFEROL 1.25 MG (50000 UT) PO CAPS: 50000.0000 [IU] | ORAL_CAPSULE | ORAL | 1 refills | Status: DC

## 2018-06-24 NOTE — Progress Notes (Signed)
Chief Complaint  Patient presents with  . Acute Visit    genetial lesion    F/u  1. Std check due to wife cheating and he saw sore on penis shaft x 2 recently w/in the last 1-2 weeks painful sore he has been with wife 5 years HS sweethearts and married for 2 years  2. Lost from 280s to 230s now 240s after surgery Nissen and GERD improved  3. OSA he feels like better and wants to know if should use CPAP     Review of Systems  Constitutional: Positive for weight loss.  HENT: Negative for hearing loss.   Eyes: Negative for blurred vision.  Respiratory: Negative for shortness of breath.   Cardiovascular: Negative for chest pain.  Gastrointestinal: Negative for abdominal pain and heartburn.  Genitourinary:       +penile sore   Musculoskeletal: Negative for falls.  Skin: Negative for rash.  Neurological: Negative for headaches.  Psychiatric/Behavioral: Negative for depression.   Past Medical History:  Diagnosis Date  . Arthritis    knee s/p right knee surgery meniscal tear with bone fragments, ankle (ortho Dr. Rudene Christians)  . GERD (gastroesophageal reflux disease)   . H. pylori infection   . OSA on CPAP   . Sleep apnea 06/2017   PT JUST WAS DX WITH SLEEP APNEA LAST WEEK AND IS WAITING ON HIS CPAP MACHINE   Past Surgical History:  Procedure Laterality Date  . ESOPHAGOGASTRODUODENOSCOPY (EGD) WITH PROPOFOL N/A 01/26/2018   Procedure: ESOPHAGOGASTRODUODENOSCOPY (EGD) WITH PROPOFOL;  Surgeon: [name], MD;  Location: [facility] ENDOSCOPY;  Service: Gastroenterology;  Laterality: N/A;  . KIDNEY DONATION Left    2004  . KNEE ARTHROSCOPY WITH MEDIAL MENISECTOMY Right 07/15/2017   Procedure: KNEE ARTHROSCOPY WITH MEDIAL MENISECTOMY;  Surgeon: Hessie Knows, MD;  Location: [facility] ORS;  Service: Orthopedics;  Laterality: Right;  . meniscal tear     right knee Dr. Rudene Christians 2014   . MENISECTOMY Right 07/15/2017   Procedure: MENISECTOMY;  Surgeon: Hessie Knows, MD;  Location: [facility] ORS;  Service:  Orthopedics;  Laterality: Right;  . ROBOTIC ASSISTED LAPAROSCOPIC NISSEN FUNDOPLICATION N/A 92/03/9416   Procedure: ROBOTIC ASSISTED LAPAROSCOPIC NISSEN FUNDOPLICATION WITH HIATAL HERNIA REPAIR;  Surgeon: Jules Husbands, MD;  Location: [facility] ORS;  Service: General;  Laterality: N/A;  . UMBILICAL HERNIA REPAIR N/A 03/03/2018   Procedure: HERNIA REPAIR UMBILICAL ADULT;  Surgeon: Jules Husbands, MD;  Location: [facility] ORS;  Service: General;  Laterality: N/A;   Family History  Problem Relation Age of Onset  . Drug abuse Mother   . Hyperlipidemia Mother   . Hypertension Mother   . Cancer Maternal Aunt        brain, breast, lung ? primary was smoker   . Prostate cancer Neg Hx   . Bladder Cancer Neg Hx   . Kidney cancer Neg Hx    Social History   Socioeconomic History  . Marital status: Married    Spouse name: Not on file  . Number of children: Not on file  . Years of education: Not on file  . Highest education level: Not on file  Occupational History  . Not on file  Social Needs  . Financial resource strain: Not on file  . Food insecurity:    Worry: Not on file    Inability: Not on file  . Transportation needs:    Medical: Not on file    Non-medical: Not on file  Tobacco Use  . Smoking status: Never Smoker  .  Smokeless tobacco: Never Used  . Tobacco comment: light in the past social smoker quit  Substance and Sexual Activity  . Alcohol use: Yes    Alcohol/week: 1.0 - 2.0 standard drinks    Types: 1 - 2 Shots of liquor per week  . Drug use: No  . Sexual activity: Yes  Lifestyle  . Physical activity:    Days per week: Not on file    Minutes per session: Not on file  . Stress: Not on file  Relationships  . Social connections:    Talks on phone: Not on file    Gets together: Not on file    Attends religious service: Not on file    Active member of club or organization: Not on file    Attends meetings of clubs or organizations: Not on file    Relationship status: Not on  file  . Intimate partner violence:    Fear of current or ex partner: Not on file    Emotionally abused: Not on file    Physically abused: Not on file    Forced sexual activity: Not on file  Other Topics Concern  . 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 nabumetone (RELAFEN) 750 MG tablet Take 750 mg by mouth 2 (two) times daily.   . Olopatadine HCl 0.2 % SOLN Place 1 drop into both eyes daily as needed (runny eyes).   . tadalafil (CIALIS) 5 MG tablet Take 1 tablet (5 mg total) by mouth daily as needed for erectile dysfunction.   No Known Allergies No results found for this or any previous visit (from the past 2160 hour(s)). Objective  Body mass index is 33.09 kg/m. Wt Readings from Last 3 Encounters:  06/24/18 244 lb (110.7 kg)  03/16/18 256 lb (116.1 kg)  03/03/18 272 lb 9.6 oz (123.7 kg)   Temp Readings from Last 3 Encounters:  06/24/18 98.2 F (36.8 C) (Oral)  03/16/18 97.7 F (36.5 C) (Skin)  03/04/18 98 F (36.7 C) (Oral)   BP Readings from Last 3 Encounters:  06/24/18 128/80  03/16/18 118/66  03/04/18 (!) 141/92   Pulse Readings from Last 3 Encounters:  06/24/18 94  03/16/18 (!) 102  03/04/18 97    Physical Exam Vitals signs and nursing note reviewed.  Constitutional:      Appearance: Normal appearance. He is well-developed and well-groomed. He is obese.  HENT:     Head: Normocephalic and atraumatic.     Nose: Nose normal.     Mouth/Throat:     Mouth: Mucous membranes are moist.     Pharynx: Oropharynx is clear.  Eyes:     Conjunctiva/sclera: Conjunctivae normal.     Pupils: Pupils are equal, round, and reactive to light.  Cardiovascular:     Rate and Rhythm: Normal rate and regular rhythm.     Heart sounds: Normal heart sounds.  Pulmonary:     Effort: Pulmonary effort  is normal.     Breath sounds: Normal breath sounds.  Skin:    General: Skin is warm and dry.  Neurological:     General: No focal deficit present.     Mental Status: He is alert and oriented to person, place, and time. Mental status is at baseline.     Gait: Gait  normal.  Psychiatric:        Attention and Perception: Attention and perception normal.        Mood and Affect: Mood and affect normal.        Speech: Speech normal.        Behavior: Behavior normal. Behavior is cooperative.        Thought Content: Thought content normal.        Cognition and Memory: Cognition and memory normal.        Judgment: Judgment normal.    Penile ulceration on shaft of penis underside  Assessment   1. HLD  2. STD check with penile ulceration  3. osa on cpap  4. HM 5. anxiety Plan   1. Check fasting lipid in future  2. Std check today  Consider px valtrex  3. Cont cpap  4.  Flu shot today  UTD Tdap  Check MMR and hep B status  Std check today  5. Prn xanax f/u in 2 months not long term  Provider: Dr. Olivia Mackie McLean-Scocuzza-Internal Medicine

## 2018-06-24 NOTE — Addendum Note (Signed)
Addended by: Penne Lash on: 06/24/2018 01:47 PM   Modules accepted: Orders

## 2018-06-24 NOTE — Patient Instructions (Signed)
L theanine anxiety, stress, sleep  Meditation  Exercise  SEL Group    Generalized Anxiety Disorder, Adult Generalized anxiety disorder (GAD) is a mental health disorder. People with this condition constantly worry about everyday events. Unlike normal anxiety, worry related to GAD is not triggered by a specific event. These worries also do not fade or get better with time. GAD interferes with life functions, including relationships, work, and school. GAD can vary from mild to severe. People with severe GAD can have intense waves of anxiety with physical symptoms (panic attacks). What are the causes? The exact cause of GAD is not known. What increases the risk? This condition is more likely to develop in:  Women.  People who have a family history of anxiety disorders.  People who are very shy.  People who experience very stressful life events, such as the death of a loved one.  People who have a very stressful family environment. What are the signs or symptoms? People with GAD often worry excessively about many things in their lives, such as their health and family. They may also be overly concerned about:  Doing well at work.  Being on time.  Natural disasters.  Friendships. Physical symptoms of GAD include:  Fatigue.  Muscle tension or having muscle twitches.  Trembling or feeling shaky.  Being easily startled.  Feeling like your heart is pounding or racing.  Feeling out of breath or like you cannot take a deep breath.  Having trouble falling asleep or staying asleep.  Sweating.  Nausea, diarrhea, or irritable bowel syndrome (IBS).  Headaches.  Trouble concentrating or remembering facts.  Restlessness.  Irritability. How is this diagnosed? Your health care provider can diagnose GAD based on your symptoms and medical history. You will also have a physical exam. The health care provider will ask specific questions about your symptoms, including how severe  they are, when they started, and if they come and go. Your health care provider may ask you about your use of alcohol or drugs, including prescription medicines. Your health care provider may refer you to a mental health specialist for further evaluation. Your health care provider will do a thorough examination and may perform additional tests to rule out other possible causes of your symptoms. To be diagnosed with GAD, a person must have anxiety that:  Is out of his or her control.  Affects several different aspects of his or her life, such as work and relationships.  Causes distress that makes him or her unable to take part in normal activities.  Includes at least three physical symptoms of GAD, such as restlessness, fatigue, trouble concentrating, irritability, muscle tension, or sleep problems. Before your health care provider can confirm a diagnosis of GAD, these symptoms must be present more days than they are not, and they must last for six months or longer. How is this treated? The following therapies are usually used to treat GAD:  Medicine. Antidepressant medicine is usually prescribed for long-term daily control. Antianxiety medicines may be added in severe cases, especially when panic attacks occur.  Talk therapy (psychotherapy). Certain types of talk therapy can be helpful in treating GAD by providing support, education, and guidance. Options include: ? Cognitive behavioral therapy (CBT). People learn coping skills and techniques to ease their anxiety. They learn to identify unrealistic or negative thoughts and behaviors and to replace them with positive ones. ? Acceptance and commitment therapy (ACT). This treatment teaches people how to be mindful as a way to cope with  unwanted thoughts and feelings. ? Biofeedback. This process trains you to manage your body's response (physiological response) through breathing techniques and relaxation methods. You will work with a therapist while  machines are used to monitor your physical symptoms.  Stress management techniques. These include yoga, meditation, and exercise. A mental health specialist can help determine which treatment is best for you. Some people see improvement with one type of therapy. However, other people require a combination of therapies. Follow these instructions at home:  Take over-the-counter and prescription medicines only as told by your health care provider.  Try to maintain a normal routine.  Try to anticipate stressful situations and allow extra time to manage them.  Practice any stress management or self-calming techniques as taught by your health care provider.  Do not punish yourself for setbacks or for not making progress.  Try to recognize your accomplishments, even if they are small.  Keep all follow-up visits as told by your health care provider. This is important. Contact a health care provider if:  Your symptoms do not get better.  Your symptoms get worse.  You have signs of depression, such as: ? A persistently sad, cranky, or irritable mood. ? Loss of enjoyment in activities that used to bring you joy. ? Change in weight or eating. ? Changes in sleeping habits. ? Avoiding friends or family members. ? Loss of energy for normal tasks. ? Feelings of guilt or worthlessness. Get help right away if:  You have serious thoughts about hurting yourself or others. If you ever feel like you may hurt yourself or others, or have thoughts about taking your own life, get help right away. You can go to your nearest emergency department or call:  Your local emergency services (911 in the [place]  A suicide crisis helpline, such as the National Suicide Prevention Lifeline at 801-658-6228. This is open 24 hours a day. Summary  Generalized anxiety disorder (GAD) is a mental health disorder that involves worry that is not triggered by a specific event.  People with GAD often worry excessively  about many things in their lives, such as their health and family.  GAD may cause physical symptoms such as restlessness, trouble concentrating, sleep problems, frequent sweating, nausea, diarrhea, headaches, and trembling or muscle twitching.  A mental health specialist can help determine which treatment is best for you. Some people see improvement with one type of therapy. However, other people require a combination of therapies. This information is not intended to replace advice given to you by your health care provider. Make sure you discuss any questions you have with your health care provider. Document Released: 09/05/2012 Document Revised: 03/31/2016 Document Reviewed: 03/31/2016 Elsevier Interactive Patient Education  2019 [employer].

## 2018-06-24 NOTE — Addendum Note (Signed)
Addended by: Warden FillersWRIGHT, Dorotea Hand S on: 06/24/2018 01:01 PM   Modules accepted: Orders

## 2018-06-27 ENCOUNTER — Encounter: Payer: Self-pay | Admitting: Internal Medicine

## 2018-06-27 ENCOUNTER — Ambulatory Visit: Payer: Self-pay

## 2018-06-27 ENCOUNTER — Other Ambulatory Visit: Payer: Self-pay | Admitting: Internal Medicine

## 2018-06-27 DIAGNOSIS — B009 Herpesviral infection, unspecified: Secondary | ICD-10-CM

## 2018-06-27 LAB — HEPATITIS B SURFACE ANTIBODY, QUANTITATIVE: Hepatitis B-Post: <5 m[IU]/mL — ABNORMAL LOW (ref >=10)

## 2018-06-27 LAB — MEASLES/MUMPS/RUBELLA IMMUNITY
MUMPS IGG: 193 [AU]/ml
RUBEOLA IGG: 178 [AU]/ml
Rubella: 7.19 index

## 2018-06-27 LAB — RPR: RPR Ser Ql: NONREACTIVE

## 2018-06-27 LAB — URINE CYTOLOGY ANCILLARY ONLY
Chlamydia: NEGATIVE
Neisseria Gonorrhea: NEGATIVE
TRICH (WINDOWPATH): NEGATIVE

## 2018-06-27 LAB — HIV ANTIBODY (ROUTINE TESTING W REFLEX): HIV 1&2 Ab, 4th Generation: NONREACTIVE

## 2018-06-27 LAB — HEPATITIS C ANTIBODY
HEP C AB: NONREACTIVE
SIGNAL TO CUT-OFF: 0.03 (ref <1.00)

## 2018-06-27 MED ORDER — VALACYCLOVIR HCL 500 MG PO TABS: 500.0000 mg | ORAL_TABLET | Freq: Every day | ORAL | 3 refills | Status: DC

## 2018-06-27 NOTE — Telephone Encounter (Signed)
Mal Amabile please call the patient   TMS

## 2018-06-27 NOTE — Telephone Encounter (Signed)
Pt. Calling to report he received the My Chart message from Dr. Shirlee Latch- Nickolas Madrid, and has been unable to answer her. States he would like the Valtrex sent to [facility] in [place] on [address]. Today. He would like Dr.Tracy or Mal Amabile to call him tomorrow in regard to lab work.

## 2018-06-28 LAB — HSV-2 IGG SUPPLEMENTAL TEST: HSV-2 IgG Supplemental Test: POSITIVE — AB

## 2018-06-28 LAB — HSV(HERPES SMPLX)ABS-I+II(IGG+IGM)-BLD
HSV 1 GLYCOPROTEIN G AB, IGG: 32 {index} — AB (ref 0.00–0.90)
HSV 2 IgG, Type Spec: 3.41 index — ABNORMAL HIGH (ref 0.00–0.90)
HSVI/II Comb IgM: 1.21 Ratio — ABNORMAL HIGH (ref 0.00–0.90)

## 2018-06-28 LAB — URINE CYTOLOGY ANCILLARY ONLY: Bacterial vaginitis: POSITIVE — AB

## 2018-06-28 NOTE — Telephone Encounter (Signed)
Unable to leave message for patient to return call back. [facility] may give results and obtain information.  

## 2018-06-29 ENCOUNTER — Other Ambulatory Visit: Payer: Self-pay | Admitting: Internal Medicine

## 2018-06-29 DIAGNOSIS — N760 Acute vaginitis: Principal | ICD-10-CM

## 2018-06-29 DIAGNOSIS — B9689 Other specified bacterial agents as the cause of diseases classified elsewhere: Secondary | ICD-10-CM

## 2018-06-29 MED ORDER — METRONIDAZOLE 500 MG PO TABS: 500.0000 mg | ORAL_TABLET | Freq: Two times a day (BID) | ORAL | 0 refills | Status: DC

## 2018-06-30 ENCOUNTER — Telehealth: Payer: Self-pay | Admitting: Internal Medicine

## 2018-06-30 NOTE — Telephone Encounter (Signed)
mychart message sent to patient

## 2018-06-30 NOTE — Telephone Encounter (Signed)
Does he want to see pulmonary to see if he can come off cpap with weight loss?  Insurance may cover repeat sleep study at home? This will have to go through them?   TMS

## 2018-08-18 ENCOUNTER — Other Ambulatory Visit: Payer: Self-pay

## 2018-08-18 ENCOUNTER — Other Ambulatory Visit (INDEPENDENT_AMBULATORY_CARE_PROVIDER_SITE_OTHER): Payer: BC Managed Care – PPO

## 2018-08-18 DIAGNOSIS — E785 Hyperlipidemia, unspecified: Secondary | ICD-10-CM

## 2018-08-18 LAB — LIPID PANEL
Cholesterol: 213 mg/dL — ABNORMAL HIGH (ref 0–200)
HDL: 47.1 mg/dL (ref >39.00)
LDL Cholesterol: 129 mg/dL — ABNORMAL HIGH (ref 0–99)
NonHDL: 165.55
TRIGLYCERIDES: 183 mg/dL — AB (ref 0.0–149.0)
Total CHOL/HDL Ratio: 5
VLDL: 36.6 mg/dL (ref 0.0–40.0)

## 2018-08-23 ENCOUNTER — Ambulatory Visit (INDEPENDENT_AMBULATORY_CARE_PROVIDER_SITE_OTHER): Payer: BC Managed Care – PPO | Admitting: Internal Medicine

## 2018-08-23 ENCOUNTER — Encounter: Payer: Self-pay | Admitting: Internal Medicine

## 2018-08-23 ENCOUNTER — Other Ambulatory Visit: Payer: Self-pay

## 2018-08-23 DIAGNOSIS — E785 Hyperlipidemia, unspecified: Secondary | ICD-10-CM

## 2018-08-23 DIAGNOSIS — Z9989 Dependence on other enabling machines and devices: Secondary | ICD-10-CM

## 2018-08-23 DIAGNOSIS — G4733 Obstructive sleep apnea (adult) (pediatric): Secondary | ICD-10-CM

## 2018-08-23 DIAGNOSIS — B009 Herpesviral infection, unspecified: Secondary | ICD-10-CM

## 2018-08-23 DIAGNOSIS — F411 Generalized anxiety disorder: Secondary | ICD-10-CM | POA: Diagnosis not present

## 2018-08-23 DIAGNOSIS — H04203 Unspecified epiphora, bilateral lacrimal glands: Secondary | ICD-10-CM

## 2018-08-23 DIAGNOSIS — F329 Major depressive disorder, single episode, unspecified: Secondary | ICD-10-CM

## 2018-08-23 DIAGNOSIS — F32A Depression, unspecified: Secondary | ICD-10-CM

## 2018-08-23 DIAGNOSIS — E559 Vitamin D deficiency, unspecified: Secondary | ICD-10-CM | POA: Insufficient documentation

## 2018-08-23 DIAGNOSIS — F419 Anxiety disorder, unspecified: Secondary | ICD-10-CM

## 2018-08-23 MED ORDER — ALPRAZOLAM 0.25 MG PO TABS: 0.2500 mg | ORAL_TABLET | Freq: Every day | ORAL | 1 refills | Status: DC | PRN

## 2018-08-23 MED ORDER — BUPROPION HCL 75 MG PO TABS: 75.0000 mg | ORAL_TABLET | Freq: Every morning | ORAL | 1 refills | Status: DC

## 2018-08-23 MED ORDER — OLOPATADINE HCL 0.2 % OP SOLN: 1.0000 [drp] | Freq: Every day | OPHTHALMIC | 11 refills | Status: DC | PRN

## 2018-08-23 NOTE — Progress Notes (Signed)
Virtual Visit via Video Note  I connected with Joshua Ramirez on 08/23/18 at  9:30 AM EDT by a video enabled telemedicine application and verified that I am speaking with the correct person using two identifiers.  Location patient: work [employer] education [employer]  Persons participating in the virtual visit: patient, provider  I discussed the limitations of evaluation and management by telemedicine and the availability of in person appointments. The patient expressed understanding and agreed to proceed.   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 Xanax prn helped and wants a refill at least for now.  Mood is depressed w/o SI   Watery eyes needs refill of pataday eye drops   HSV taking valtrex daily not outbreak yet anxiety about having to tell sexual partners in the future when he thinks he got this from his wife who cheated during the marriage.    Vitamin D def taking D3 50K IU weekly on Sat/Sunday energy level has not changed but advised pt could be due to mood.   HLD he is eating shrimp, crab legs, ground [place], chicken, deli meat/processed meat not really red meat since 03/04/2019 surgery and not really cheese TC from 209 to 213, HDL from 36 to 47, LDL from 138 to 129, TGS from 169 to 183. He is exercising but not at the gym due to COVID 19 for now   OSA on cpap still using for now x 30 days due to CDLS requirement and then will use prn he is unsure if still needs it since he has lost weight    ROS: See pertinent positives and negatives per HPI.  Past Medical History:  Diagnosis Date  . Arthritis    knee s/p right knee surgery meniscal tear with bone fragments, ankle (ortho Dr. Rosita Kea)  . GERD (gastroesophageal reflux disease)   . H. pylori infection   . Herpes infection   . OSA on CPAP   . Sleep apnea 06/2017   PT JUST WAS DX WITH SLEEP APNEA LAST WEEK AND IS WAITING ON HIS CPAP MACHINE    Past  Surgical History:  Procedure Laterality Date  . ESOPHAGOGASTRODUODENOSCOPY (EGD) WITH PROPOFOL N/A 01/26/2018   Procedure: ESOPHAGOGASTRODUODENOSCOPY (EGD) WITH PROPOFOL;  Surgeon: [name], MD;  Location: [facility] ENDOSCOPY;  Service: Gastroenterology;  Laterality: N/A;  . KIDNEY DONATION Left    2004  . KNEE ARTHROSCOPY WITH MEDIAL MENISECTOMY Right 07/15/2017   Procedure: KNEE ARTHROSCOPY WITH MEDIAL MENISECTOMY;  Surgeon: Kennedy Bucker, MD;  Location: [facility] ORS;  Service: Orthopedics;  Laterality: Right;  . meniscal tear     right knee Dr. Rosita Kea 2014   . MENISECTOMY Right 07/15/2017   Procedure: MENISECTOMY;  Surgeon: Kennedy Bucker, MD;  Location: [facility] ORS;  Service: Orthopedics;  Laterality: Right;  . ROBOTIC ASSISTED LAPAROSCOPIC NISSEN FUNDOPLICATION N/A 03/03/2018   Procedure: ROBOTIC ASSISTED LAPAROSCOPIC NISSEN FUNDOPLICATION WITH HIATAL HERNIA REPAIR;  Surgeon: Leafy Ro, MD;  Location: [facility] ORS;  Service: General;  Laterality: N/A;  . UMBILICAL HERNIA REPAIR N/A 03/03/2018   Procedure: HERNIA REPAIR UMBILICAL ADULT;  Surgeon: Leafy Ro, MD;  Location: [facility] ORS;  Service: General;  Laterality: N/A;    Family History  Problem Relation Age of Onset  . Drug abuse Mother   . Hyperlipidemia Mother   . Hypertension Mother   . Cancer Maternal Aunt        brain, breast, lung ? primary was smoker   .  Prostate cancer Neg Hx   . Bladder Cancer Neg Hx   . Kidney cancer Neg Hx     SOCIAL HX: still working as Runner, broadcasting/film/video on site    Current Outpatient Medications:  .  ALPRAZolam (XANAX) 0.25 MG tablet, Take 1 tablet (0.25 mg total) by mouth daily as needed for anxiety., Disp: 30 tablet, Rfl: 1 .  buPROPion (WELLBUTRIN) 75 MG tablet, Take 1 tablet (75 mg total) by mouth every morning., Disp: 90 tablet, Rfl: 1 .  Cholecalciferol 1.25 MG (50000 UT) capsule, Take 1 capsule (50,000 Units total) by mouth once a week., Disp: 13 capsule, Rfl: 1 .  nabumetone (RELAFEN) 750 MG tablet, Take 750  mg by mouth 2 (two) times daily. , Disp: , Rfl: 11 .  Olopatadine HCl 0.2 % SOLN, Place 1 drop into both eyes daily as needed (runny eyes)., Disp: 1 Bottle, Rfl: 11 .  tadalafil (CIALIS) 5 MG tablet, Take 1 tablet (5 mg total) by mouth daily as needed for erectile dysfunction., Disp: 30 tablet, Rfl: 11 .  valACYclovir (VALTREX) 500 MG tablet, Take 1 tablet (500 mg total) by mouth daily. For prevention. With outbreak take w/in 1 day of lesion 500 mg bid x 3-7 days, Disp: 120 tablet, Rfl: 3  EXAM:  VITALS per patient if applicable:  GENERAL: alert, oriented, appears well and in no acute distress  HEENT: atraumatic, conjunttiva clear, no obvious abnormalities on inspection of external nose and ears  NECK: normal movements of the head and neck  LUNGS: on inspection no signs of respiratory distress, breathing rate appears normal, no obvious gross SOB, gasping or wheezing  CV: no obvious cyanosis  MS: moves all visible extremities without noticeable abnormality  PSYCH/NEURO: pleasant and cooperative, no obvious depression or anxiety, speech and thought processing grossly intact  ASSESSMENT AND PLAN:  Discussed the following assessment and plan:  Depression, unspecified depression type - Plan: buPROPion (WELLBUTRIN) 75 MG tablet qd consider increase to bid in 4 weeks   GAD (generalized anxiety disorder) - Plan: ALPRAZolam (XANAX) 0.25 MG tablet daily as needed   Watery eyes - Plan: Olopatadine HCl 0.2 % SOLN  Hyperlipidemia, unspecified hyperlipidemia type-disc healthy diet and exercise reduce shrimp and crab, increase steele cut oatmeal, fiber I.e chickpeas, lentils, almonds/walnuts unsalted   OSA on CPAP continuing to use   HSV infection on daily valtrex   Vitamin D deficiency-rec D3 50K 1x per week x 6 months then 5000 IU daily D3     I discussed the assessment and treatment plan with the patient. The patient was provided an opportunity to ask questions and all were answered.  The patient agreed with the plan and demonstrated an understanding of the instructions.   The patient was advised to call back or seek an in-person evaluation if the symptoms worsen or if the condition fails to improve as anticipated.  I provided 22 minutes of non-face-to-face time during this encounter.   Pasty Spillers McLean-Scocuzza, MD

## 2018-08-31 ENCOUNTER — Telehealth: Payer: Self-pay | Admitting: Internal Medicine

## 2018-08-31 NOTE — Telephone Encounter (Signed)
sch f/u 02/2019   Thanks  TMS

## 2018-11-02 ENCOUNTER — Encounter: Payer: Self-pay | Admitting: Internal Medicine

## 2018-11-14 ENCOUNTER — Other Ambulatory Visit: Payer: Self-pay | Admitting: Orthopedic Surgery

## 2018-11-14 ENCOUNTER — Other Ambulatory Visit (HOSPITAL_COMMUNITY): Payer: Self-pay | Admitting: Orthopedic Surgery

## 2018-11-14 DIAGNOSIS — M25561 Pain in right knee: Secondary | ICD-10-CM

## 2018-11-14 DIAGNOSIS — G8929 Other chronic pain: Secondary | ICD-10-CM

## 2018-11-29 ENCOUNTER — Other Ambulatory Visit: Payer: Self-pay

## 2018-11-29 ENCOUNTER — Ambulatory Visit
Admission: RE | Admit: 2018-11-29 | Discharge: 2018-11-29 | Disposition: A | Discharge status: Home / Self Care | Payer: BC Managed Care – PPO | Source: Ambulatory Visit | Attending: Orthopedic Surgery | Admitting: Orthopedic Surgery

## 2018-11-29 DIAGNOSIS — G8929 Other chronic pain: Secondary | ICD-10-CM | POA: Insufficient documentation

## 2018-11-29 DIAGNOSIS — M25561 Pain in right knee: Secondary | ICD-10-CM | POA: Insufficient documentation

## 2018-12-08 ENCOUNTER — Encounter
Admission: RE | Admit: 2018-12-08 | Discharge: 2018-12-08 | Disposition: A | Discharge status: Home / Self Care | Payer: BC Managed Care – PPO | Source: Ambulatory Visit | Attending: Orthopedic Surgery | Admitting: Orthopedic Surgery

## 2018-12-08 ENCOUNTER — Other Ambulatory Visit: Payer: Self-pay

## 2018-12-08 DIAGNOSIS — Z1159 Encounter for screening for other viral diseases: Secondary | ICD-10-CM | POA: Insufficient documentation

## 2018-12-08 NOTE — Patient Instructions (Signed)
Your procedure is scheduled on: 12/12/18 Report to Day Surgery.[facility] SECOND FLOOR To find out your arrival time please call [phone] between 1PM - 3PM on 12/09/18.  Remember: Instructions that are not followed completely may result in serious medical risk,  up to and including death, or upon the discretion of your surgeon and anesthesiologist your  surgery may need to be rescheduled.     _X__ 1. Do not eat food after midnight the night before your procedure.                 No gum chewing or hard candies. You may drink clear liquids up to 2 hours                 before you are scheduled to arrive for your surgery- DO not drink clear                 liquids within 2 hours of the start of your surgery.                 Clear Liquids include:  water, apple juice without pulp, clear carbohydrate                 drink such as Clearfast of Gatorade, Black Coffee or Tea (Do not add                 anything to coffee or tea).  __X__2.  On the morning of surgery brush your teeth with toothpaste and water, you                may rinse your mouth with mouthwash if you wish.  Do not swallow any toothpaste of mouthwash.     _X__ 3.  No Alcohol for 24 hours before or after surgery.   _X__ 4.  Do Not Smoke or use e-cigarettes For 24 Hours Prior to Your Surgery.                 Do not use any chewable tobacco products for at least 6 hours prior to                 surgery.  ____  5.  Bring all medications with you on the day of surgery if instructed.   __X__  6.  Notify your doctor if there is any change in your medical condition      (cold, fever, infections).     Do not wear jewelry, make-up, hairpins, clips or nail polish. Do not wear lotions, powders, or perfumes. You may wear deodorant. Do not shave 48 hours prior to surgery. Men may shave face and neck. Do not bring valuables to the hospital.    [facility] is not responsible for any belongings or  valuables.  Contacts, dentures or bridgework may not be worn into surgery. Leave your suitcase in the car. After surgery it may be brought to your room. For patients admitted to the hospital, discharge time is determined by your treatment team.   Patients discharged the day of surgery will not be allowed to drive home.   Please read over the following fact sheets that you were given:   Surgical Site Infection Prevention          ____ Take these medicines the morning of surgery with A SIP OF WATER:    1. VALTREX  2.   3.   4.  5.  6.  ____ Fleet Enema (as directed)   __X__  Use CHG Soap as directed  ____ Use inhalers on the day of surgery  ____ Stop metformin 2 days prior to surgery    ____ Take 1/2 of usual insulin dose the night before surgery. No insulin the morning          of surgery.   ____ Stop Coumadin/Plavix/aspirin on   ____ Stop Anti-inflammatories on    ____ Stop supplements until after surgery.    ____ Bring C-Pap to the hospital.

## 2018-12-09 LAB — SARS CORONAVIRUS 2 (TAT 6-24 HRS): SARS Coronavirus 2: NEGATIVE

## 2018-12-12 ENCOUNTER — Other Ambulatory Visit: Payer: Self-pay

## 2018-12-12 ENCOUNTER — Ambulatory Visit: Payer: BC Managed Care – PPO | Admitting: Anesthesiology

## 2018-12-12 ENCOUNTER — Encounter: Payer: Self-pay | Admitting: *Deleted

## 2018-12-12 ENCOUNTER — Encounter
Admission: RE | Disposition: A | Discharge status: Home / Self Care | Payer: Self-pay | Source: Home / Self Care | Attending: Orthopedic Surgery

## 2018-12-12 ENCOUNTER — Ambulatory Visit
Admission: RE | Admit: 2018-12-12 | Discharge: 2018-12-12 | Disposition: A | Discharge status: Home / Self Care | Payer: BC Managed Care – PPO | Attending: Orthopedic Surgery | Admitting: Orthopedic Surgery

## 2018-12-12 DIAGNOSIS — X58XXXA Exposure to other specified factors, initial encounter: Secondary | ICD-10-CM | POA: Insufficient documentation

## 2018-12-12 DIAGNOSIS — F329 Major depressive disorder, single episode, unspecified: Secondary | ICD-10-CM | POA: Diagnosis not present

## 2018-12-12 DIAGNOSIS — M67461 Ganglion, right knee: Secondary | ICD-10-CM | POA: Diagnosis not present

## 2018-12-12 DIAGNOSIS — S83231A Complex tear of medial meniscus, current injury, right knee, initial encounter: Secondary | ICD-10-CM | POA: Insufficient documentation

## 2018-12-12 DIAGNOSIS — M7121 Synovial cyst of popliteal space [Baker], right knee: Secondary | ICD-10-CM | POA: Insufficient documentation

## 2018-12-12 DIAGNOSIS — Z79899 Other long term (current) drug therapy: Secondary | ICD-10-CM | POA: Insufficient documentation

## 2018-12-12 DIAGNOSIS — G4733 Obstructive sleep apnea (adult) (pediatric): Secondary | ICD-10-CM | POA: Insufficient documentation

## 2018-12-12 DIAGNOSIS — F419 Anxiety disorder, unspecified: Secondary | ICD-10-CM | POA: Insufficient documentation

## 2018-12-12 DIAGNOSIS — M1990 Unspecified osteoarthritis, unspecified site: Secondary | ICD-10-CM | POA: Insufficient documentation

## 2018-12-12 DIAGNOSIS — S83241A Other tear of medial meniscus, current injury, right knee, initial encounter: Secondary | ICD-10-CM | POA: Diagnosis present

## 2018-12-12 HISTORY — PX: KNEE ARTHROSCOPY WITH MEDIAL MENISECTOMY: SHX5651

## 2018-12-12 HISTORY — PX: CYST EXCISION: SHX5701

## 2018-12-12 SURGERY — ARTHROSCOPY, KNEE, WITH MEDIAL MENISCECTOMY
Anesthesia: General | Laterality: Right

## 2018-12-12 MED ORDER — BUPIVACAINE HCL (PF) 0.5 % IJ SOLN
INTRAMUSCULAR | Status: AC
  Filled 2018-12-12: qty 30

## 2018-12-12 MED ORDER — MIDAZOLAM HCL 2 MG/2ML IJ SOLN
INTRAMUSCULAR | Status: DC | PRN
  Administered 2018-12-12: 2 mg via INTRAVENOUS

## 2018-12-12 MED ORDER — EPHEDRINE SULFATE 50 MG/ML IJ SOLN
INTRAMUSCULAR | Status: AC
  Filled 2018-12-12: qty 1

## 2018-12-12 MED ORDER — FENTANYL CITRATE (PF) 100 MCG/2ML IJ SOLN
INTRAMUSCULAR | Status: DC | PRN
  Administered 2018-12-12: 50 ug via INTRAVENOUS
  Administered 2018-12-12: 25 ug via INTRAVENOUS

## 2018-12-12 MED ORDER — FAMOTIDINE 20 MG PO TABS: 20.0000 mg | ORAL_TABLET | Freq: Once | ORAL | Status: DC

## 2018-12-12 MED ORDER — LIDOCAINE HCL (CARDIAC) PF 100 MG/5ML IV SOSY
PREFILLED_SYRINGE | INTRAVENOUS | Status: DC | PRN
  Administered 2018-12-12: 60 mg via INTRAVENOUS

## 2018-12-12 MED ORDER — LIDOCAINE HCL (PF) 2 % IJ SOLN
INTRAMUSCULAR | Status: AC
  Filled 2018-12-12: qty 10

## 2018-12-12 MED ORDER — PROPOFOL 10 MG/ML IV BOLUS
INTRAVENOUS | Status: DC | PRN
  Administered 2018-12-12: 200 mg via INTRAVENOUS

## 2018-12-12 MED ORDER — PROPOFOL 10 MG/ML IV BOLUS
INTRAVENOUS | Status: AC
  Filled 2018-12-12: qty 20

## 2018-12-12 MED ORDER — ACETAMINOPHEN 500 MG PO TABS: 1000.0000 mg | ORAL_TABLET | Freq: Three times a day (TID) | ORAL | 2 refills | Status: AC

## 2018-12-12 MED ORDER — MIDAZOLAM HCL 2 MG/2ML IJ SOLN
INTRAMUSCULAR | Status: AC
  Filled 2018-12-12: qty 2

## 2018-12-12 MED ORDER — HYDROCODONE-ACETAMINOPHEN 5-325 MG PO TABS: 1.0000 | ORAL_TABLET | ORAL | 0 refills | Status: DC | PRN

## 2018-12-12 MED ORDER — GLYCOPYRROLATE 0.2 MG/ML IJ SOLN
INTRAMUSCULAR | Status: DC | PRN
  Administered 2018-12-12: .2 mg via INTRAVENOUS

## 2018-12-12 MED ORDER — PHENYLEPHRINE HCL (PRESSORS) 10 MG/ML IV SOLN
INTRAVENOUS | Status: DC | PRN
  Administered 2018-12-12 (×2): 100 ug via INTRAVENOUS

## 2018-12-12 MED ORDER — SODIUM CHLORIDE (PF) 0.9 % IJ SOLN
INTRAMUSCULAR | Status: AC
  Filled 2018-12-12: qty 10

## 2018-12-12 MED ORDER — EPINEPHRINE (ANAPHYLAXIS) 30 MG/30ML IJ SOLN
INTRAMUSCULAR | Status: AC
  Filled 2018-12-12: qty 30

## 2018-12-12 MED ORDER — ONDANSETRON 4 MG PO TBDP: 4.0000 mg | ORAL_TABLET | Freq: Three times a day (TID) | ORAL | 0 refills | Status: DC | PRN

## 2018-12-12 MED ORDER — LIDOCAINE-EPINEPHRINE (PF) 1 %-1:200000 IJ SOLN
INTRAMUSCULAR | Status: AC
  Filled 2018-12-12: qty 30

## 2018-12-12 MED ORDER — ASPIRIN EC 325 MG PO TBEC: 325.0000 mg | DELAYED_RELEASE_TABLET | Freq: Every day | ORAL | 0 refills | Status: AC

## 2018-12-12 MED ORDER — LACTATED RINGERS IV SOLN
INTRAVENOUS | Status: DC
  Administered 2018-12-12 (×2): via INTRAVENOUS

## 2018-12-12 MED ORDER — LIDOCAINE-EPINEPHRINE 1 %-1:100000 IJ SOLN
INTRAMUSCULAR | Status: DC | PRN
  Administered 2018-12-12: 5 mL
  Administered 2018-12-12: 2 mL

## 2018-12-12 MED ORDER — ROCURONIUM BROMIDE 100 MG/10ML IV SOLN
INTRAVENOUS | Status: AC
  Filled 2018-12-12: qty 1

## 2018-12-12 MED ORDER — FAMOTIDINE 20 MG PO TABS
ORAL_TABLET | ORAL | Status: AC
  Filled 2018-12-12: qty 1

## 2018-12-12 MED ORDER — FENTANYL CITRATE (PF) 250 MCG/5ML IJ SOLN
INTRAMUSCULAR | Status: AC
  Filled 2018-12-12: qty 5

## 2018-12-12 MED ORDER — PROMETHAZINE HCL 25 MG/ML IJ SOLN: 6.2500 mg | INTRAMUSCULAR | Status: DC | PRN

## 2018-12-12 MED ORDER — DEXAMETHASONE SODIUM PHOSPHATE 10 MG/ML IJ SOLN
INTRAMUSCULAR | Status: DC | PRN
  Administered 2018-12-12: 10 mg via INTRAVENOUS

## 2018-12-12 MED ORDER — BUPIVACAINE HCL (PF) 0.5 % IJ SOLN
INTRAMUSCULAR | Status: DC | PRN
  Administered 2018-12-12: 2 mL
  Administered 2018-12-12: 5 mL

## 2018-12-12 MED ORDER — CEFAZOLIN SODIUM-DEXTROSE 2-4 GM/100ML-% IV SOLN
2.0000 g | Freq: Once | INTRAVENOUS | Status: AC
  Administered 2018-12-12: 2 g via INTRAVENOUS
  Filled 2018-12-12: qty 100

## 2018-12-12 MED ORDER — FENTANYL CITRATE (PF) 100 MCG/2ML IJ SOLN: 25.0000 ug | INTRAMUSCULAR | Status: DC | PRN

## 2018-12-12 MED ORDER — IBUPROFEN 800 MG PO TABS: 800.0000 mg | ORAL_TABLET | Freq: Three times a day (TID) | ORAL | 0 refills | Status: AC

## 2018-12-12 MED ORDER — LACTATED RINGERS IV SOLN
INTRAVENOUS | Status: DC | PRN
  Administered 2018-12-12: 4 mL

## 2018-12-12 SURGICAL SUPPLY — 55 items
ADAPTER IRRIG TUBE 2 SPIKE SOL (ADAPTER) ×6 IMPLANT
BANDAGE ACE 6X5 VEL STRL LF (GAUZE/BANDAGES/DRESSINGS) ×3 IMPLANT
BLADE SURG SZ11 CARB STEEL (BLADE) ×3 IMPLANT
BNDG COHESIVE 6X5 TAN STRL LF (GAUZE/BANDAGES/DRESSINGS) ×3 IMPLANT
BNDG ESMARK 6X12 TAN STRL LF (GAUZE/BANDAGES/DRESSINGS) ×3 IMPLANT
BUR RADIUS 3.5 (BURR) ×1 IMPLANT
BUR RADIUS 4.0X18.5 (BURR) IMPLANT
CAST PADDING 6X4YD ST 30248 (SOFTGOODS) ×1
CHLORAPREP W/TINT 26 (MISCELLANEOUS) ×3 IMPLANT
COOLER POLAR GLACIER W/PUMP (MISCELLANEOUS) ×3 IMPLANT
COVER WAND RF STERILE (DRAPES) ×3 IMPLANT
CUFF TOURN SGL QUICK 24 (TOURNIQUET CUFF)
CUFF TOURN SGL QUICK 30 (TOURNIQUET CUFF) ×1
CUFF TRNQT CYL 24X4X16.5-23 (TOURNIQUET CUFF) IMPLANT
CUFF TRNQT CYL 30X4X21-28X (TOURNIQUET CUFF) IMPLANT
DEVICE SUCT BLK HOLE OR FLOOR (MISCELLANEOUS) ×2 IMPLANT
DRAPE LEGGINS SURG 28X43 STRL (DRAPES) IMPLANT
DRAPE SPLIT 6X30 W/TAPE (DRAPES) ×3 IMPLANT
ELECT REM PT RETURN 9FT ADLT (ELECTROSURGICAL)
ELECTRODE REM PT RTRN 9FT ADLT (ELECTROSURGICAL) IMPLANT
GAUZE SPONGE 4X4 12PLY STRL (GAUZE/BANDAGES/DRESSINGS) ×3 IMPLANT
GLOVE BIOGEL PI IND STRL 8 (GLOVE) ×2 IMPLANT
GLOVE BIOGEL PI INDICATOR 8 (GLOVE) ×1
GLOVE SURG ORTHO 8.0 STRL STRW (GLOVE) ×6 IMPLANT
GOWN STRL REUS W/ TWL LRG LVL3 (GOWN DISPOSABLE) ×2 IMPLANT
GOWN STRL REUS W/ TWL XL LVL3 (GOWN DISPOSABLE) ×2 IMPLANT
GOWN STRL REUS W/TWL LRG LVL3 (GOWN DISPOSABLE) ×1
GOWN STRL REUS W/TWL XL LVL3 (GOWN DISPOSABLE) ×1
IV LACTATED RINGER IRRG 3000ML (IV SOLUTION) ×5
IV LR IRRIG 3000ML ARTHROMATIC (IV SOLUTION) ×8 IMPLANT
KIT TURNOVER KIT A (KITS) ×3 IMPLANT
MANIFOLD NEPTUNE II (INSTRUMENTS) ×3 IMPLANT
MAT ABSORB  FLUID 56X50 GRAY (MISCELLANEOUS) ×1
MAT ABSORB FLUID 56X50 GRAY (MISCELLANEOUS) ×4 IMPLANT
NDL MAYO CATGUT SZ5 (NEEDLE)
NDL SUT 5 .5 CRC TPR PNT MAYO (NEEDLE) IMPLANT
NEEDLE HYPO 22GX1.5 SAFETY (NEEDLE) ×3 IMPLANT
PACK ARTHROSCOPY KNEE (MISCELLANEOUS) ×3 IMPLANT
PAD ABD DERMACEA PRESS 5X9 (GAUZE/BANDAGES/DRESSINGS) ×4 IMPLANT
PAD WRAPON POLAR KNEE (MISCELLANEOUS) ×2 IMPLANT
PADDING CAST COTTON 6X4 ST (SOFTGOODS) ×2 IMPLANT
PENCIL ELECTRO HAND CTR (MISCELLANEOUS) IMPLANT
SET TUBE SUCT SHAVER OUTFL 24K (TUBING) ×3 IMPLANT
SET TUBE TIP INTRA-ARTICULAR (MISCELLANEOUS) ×3 IMPLANT
STRIP CLOSURE SKIN 1/2X4 (GAUZE/BANDAGES/DRESSINGS) IMPLANT
SUT ETHILON 3-0 FS-10 30 BLK (SUTURE) ×3
SUT MNCRL AB 4-0 PS2 18 (SUTURE) IMPLANT
SUT VIC AB 0 CT2 27 (SUTURE) IMPLANT
SUT VIC AB 2-0 CT2 27 (SUTURE) IMPLANT
SUTURE EHLN 3-0 FS-10 30 BLK (SUTURE) ×2 IMPLANT
TOWEL OR 17X26 4PK STRL BLUE (TOWEL DISPOSABLE) ×6 IMPLANT
TUBING ARTHRO INFLOW-ONLY STRL (TUBING) ×3 IMPLANT
WAND HAND CNTRL MULTIVAC 50 (MISCELLANEOUS) IMPLANT
WAND WEREWOLF FLOW 90D (MISCELLANEOUS) IMPLANT
WRAPON POLAR PAD KNEE (MISCELLANEOUS) ×3

## 2018-12-12 NOTE — Anesthesia Post-op Follow-up Note (Signed)
Anesthesia QCDR form completed.        

## 2018-12-12 NOTE — Anesthesia Postprocedure Evaluation (Signed)
Anesthesia Post Note  Patient: Joshua Ramirez  Procedure(s) Performed: KNEE ARTHROSCOPY WITH PARTIAL MEDIAL MENISECTOMY (Right ) CYST REMOVAL  Patient location during evaluation: PACU Anesthesia Type: General Level of consciousness: awake and alert Pain management: pain level controlled Vital Signs Assessment: post-procedure vital signs reviewed and stable Respiratory status: spontaneous breathing, nonlabored ventilation, respiratory function stable and patient connected to nasal cannula oxygen Cardiovascular status: blood pressure returned to baseline and stable Postop Assessment: no apparent nausea or vomiting Anesthetic complications: no     Last Vitals:  Vitals:   12/12/18 1400 12/12/18 1417  BP: 117/82 133/88  Pulse: 75 73  Resp: 16 16  Temp: (!) 36.3 C   SpO2: 99% 100%    Last Pain:  Vitals:   12/12/18 1417  TempSrc:   PainSc: 0-No pain                 Daiel Strohecker S

## 2018-12-12 NOTE — H&P (Signed)
Paper H&P to be scanned into permanent record. H&P reviewed. No significant changes noted.  

## 2018-12-12 NOTE — Discharge Instructions (Addendum)
Arthroscopic Knee Surgery - Partial Meniscectomy   Post-Op Instructions   1. Bracing or crutches: Crutches will be provided at the time of discharge from the surgery center if you do not already have them.   2. Ice: You may be provided with a device (Polar Care) that allows you to ice the affected area effectively. Otherwise you can ice manually.    3. Driving:  Plan on not driving for at least two weeks. Please note that you are advised NOT to drive while taking narcotic pain medications as you may be impaired and unsafe to drive.   4. Activity: Ankle pumps several times an hour while awake to prevent blood clots. Weight bearing: as tolerated. Use crutches for as needed (usually ~1 week or less) until pain allows you to ambulate without a limp. Bending and straightening the knee is unlimited. Elevate knee above heart level as much as possible for one week. Avoid standing more than 5 minutes (consecutively) for the first week.  Avoid long distance travel for 2 weeks.  5. Medications:  - You have been provided a prescription for narcotic pain medicine. After surgery, take 1-2 narcotic tablets every 4 hours if needed for severe pain.  - You may take up to 3000mg/day of tylenol (acetaminophen). You can take 1000mg 3x/day. Please check your narcotic. If you have acetaminophen in your narcotic (each tablet will be 325mg), be careful not to exceed a total of 3000mg/day of acetaminophen.  - A prescription for anti-nausea medication will be provided in case the narcotic medicine or anesthesia causes nausea - take 1 tablet every 6 hours only if nauseated.  - Take ibuprofen 800 mg every 8 hours WITH food to reduce post-operative knee swelling. DO NOT STOP IBUPROFEN POST-OP UNTIL INSTRUCTED TO DO SO at first post-op office visit (10-14 days after surgery). However, please discontinue if you have any abdominal discomfort after taking this.  - Take enteric coated aspirin 325 mg once daily for 2 weeks to prevent  blood clots.    6. Bandages: The physical therapist should change the bandages at the first post-op appointment. If needed, the dressing supplies have been provided to you.   7. Physical Therapy: 1-2 times per week for 6 weeks. Therapy typically starts on post operative Day 3 or 4. You have been provided an order for physical therapy. The therapist will provide home exercises.   8. Work: May return to full work usually around 2 weeks after 1st post-operative visit. May do light duty/desk job in approximately 1-2 weeks when off of narcotics, pain is well-controlled, and swelling has decreased. Labor intensive jobs may require 4-6 weeks to return.      9. Post-Op Appointments: Your first post-op appointment will be with Dr. Patel in approximately 2 weeks time.    If you find that they have not been scheduled please call the Orthopaedic Appointment front desk at [phone].  AMBULATORY SURGERY  DISCHARGE INSTRUCTIONS   1) The drugs that you were given will stay in your system until tomorrow so for the next 24 hours you should not:  A) Drive an automobile B) Make any legal decisions C) Drink any alcoholic beverage   2) You may resume regular meals tomorrow.  Today it is better to start with liquids and gradually work up to solid foods.  You may eat anything you prefer, but it is better to start with liquids, then soup and crackers, and gradually work up to solid foods.   3) Please notify your   doctor immediately if you have any unusual bleeding, trouble breathing, redness and pain at the surgery site, drainage, fever, or pain not relieved by medication.    4) Additional Instructions:        Please contact your physician with any problems or Same Day Surgery at [phone], Monday through Friday 6 am to 4 pm, or [facility] Main number at [phone].  

## 2018-12-12 NOTE — Op Note (Signed)
Operative Note    SURGERY DATE: 12/12/2018   PRE-OP DIAGNOSIS:  1. Right medial meniscus tear 2. Right Hoffa's fat pad/anterior ACL ganglion cyst 3. 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 Right medial meniscus tear 2. Right Hoffa's fat pad/anterior ACL ganglion cyst 3. 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 Right knee arthroscopic partial medial meniscectomy 2. Right knee arthroscopic partial synovectomy with ganglion cyst excision    SURGEON: Cato Mulligan, MD   ANESTHESIA: Gen   ESTIMATED BLOOD LOSS: minimal   TOTAL IV FLUIDS: per anesthesia   INDICATION(S):  Joshua Ramirez is a 43 y.o. male with signs and symptoms as well as MRI finding of medial meniscus tear and ganglion cyst in Hoffa's fat pad anterior to the ACL. He has had 2 prior right knee arthroscopic surgeries, most recently on 07/15/2017 by Dr. Rudene Christians. Prior right knee arthroscopy was in 2014. After discussion of risks, benefits, and alternatives to surgery, the patient elected to proceed.   OPERATIVE FINDINGS:    Examination under anesthesia: A careful examination under anesthesia was performed.  Passive range of motion was: Hyperextension: 2.  Extension: 0.  Flexion: 130.  Lachman: normal. Pivot Shift: normal.  Posterior drawer: normal.  Varus stability in full extension: normal.  Varus stability in 30 degrees of flexion: normal.  Valgus stability in full extension: normal.  Valgus stability in 30 degrees of flexion: normal.   Intra-operative findings: A thorough arthroscopic examination of the knee was performed.  The findings are: 1. Suprapatellar pouch: Normal 2. Undersurface of median ridge: Grade 1 softening 3. Medial patellar facet: Grade 1 softening 4. Lateral patellar facet: Grade 1 softening 5. Trochlea: Grade 2 degenerative changes centrally 6. Lateral gutter/popliteus tendon: Normal 7. Hoffa's fat pad: Inflamed with large ganglion  cyst (~11 x 88mm) anterior to the ACL insertion on the tibia 8. Medial gutter/plica: Normal 9. ACL: Normal 10. PCL: Normal 11. Medial meniscus: Degenerative radial tear, essentially full meniscus width, at meniscus body 12. Medial compartment cartilage: Focal areas of Grade 2-3 degenerative changes to the medial femoral condyle. Otherwise diffuse Grade 1 softening of MFC and tibial plateau 13. Lateral meniscus: Normal 14. Lateral compartment cartilage: Grade 1 degenerative changes to the tibial plateau and lateral femoral condyle   OPERATIVE REPORT:     I identified Joshua Ramirez in the pre-operative holding area. I marked the operative knee with my initials. I reviewed the risks and benefits of the proposed surgical intervention and the patient (and/or patient's guardian) wished to proceed. The patient was transferred to the operative suite and placed in the supine position with all bony prominences padded.  Anesthesia was administered. Appropriate IV antibiotics were administered prior to incision. The extremity was then prepped and draped in standard fashion. A time out was performed confirming the correct extremity, correct patient, and correct procedure.   Arthroscopy portals were marked. Local anesthetic was injected to the planned portal sites. The anterolateral portal was established with an 11 blade.      The arthroscope was placed in the anterolateral portal and then into the suprapatellar pouch.  A diagnostic knee scope was completed with the above findings. The medial meniscus tear was identified.   Next the medial portal was established under needle localization.  The Hoffa's fat pad cyst described above was probed and fluid was expressed from the cyst. The cyst was then excised using a combination of an oscillating shaver and Arthrocare wand such. Hoffa's  fat pad in this region was debrided with an oscillating shaver. The walls of the cyst were completely removed and the anterior ACL  could be visualized, thus completing the cyst excision and partial synovectomy.  The MCL was pie-crusted to improve visualization of the posterior horn/body. The meniscal tear was debrided using an arthroscopic biter and an oscillating shaver until the meniscus had stable borders. There were no loose flaps of cartilage on the medial femoral condyle.  Arthroscopic fluid was removed from the joint.   The portals were closed with 3-0 Nylon suture. Sterile dressings included Xeroform, 4x4s, Sof-Rol, and Bias wrap. A Polarcare was placed.  The patient was then awakened and taken to the PACU hemodynamically stable without complication.     POSTOPERATIVE PLAN: The patient will be discharged home today once they meet PACU criteria. Aspirin 325 mg daily was prescribed for 2 weeks for DVT prophylaxis.  Physical therapy will start on POD#3-4. Weight-bearing as tolerated. Follow up in 2 weeks per protocol.

## 2018-12-12 NOTE — Anesthesia Procedure Notes (Signed)
Procedure Name: LMA Insertion Date/Time: 12/12/2018 11:45 AM Performed by: Allean Found, CRNA Pre-anesthesia Checklist: Patient identified, Patient being monitored, Timeout performed, Emergency Drugs available and Suction available Patient Re-evaluated:Patient Re-evaluated prior to induction Oxygen Delivery Method: Circle system utilized Preoxygenation: Pre-oxygenation with 100% oxygen Induction Type: IV induction Ventilation: Mask ventilation without difficulty LMA: LMA inserted LMA Size: 5.0 Tube type: Oral Number of attempts: 1 Placement Confirmation: positive ETCO2 and breath sounds checked- equal and bilateral Tube secured with: Tape Dental Injury: Teeth and Oropharynx as per pre-operative assessment

## 2018-12-12 NOTE — Anesthesia Preprocedure Evaluation (Signed)
Anesthesia Evaluation  Patient identified by MRN, date of birth, ID band Patient awake    Reviewed: Allergy & Precautions, NPO status , Patient's Chart, lab work & pertinent test results, reviewed documented beta blocker date and time   History of Anesthesia Complications Negative for: history of anesthetic complications  Airway Mallampati: III  TM Distance: >3 FB     Dental  (+) Dental Advidsory Given, Teeth Intact   Pulmonary neg shortness of breath, sleep apnea , neg COPD, neg recent URI,    Pulmonary exam normal        Cardiovascular Exercise Tolerance: Good negative cardio ROS Normal cardiovascular exam     Neuro/Psych PSYCHIATRIC DISORDERS Anxiety Depression negative neurological ROS     GI/Hepatic Neg liver ROS, GERD  Controlled,  Endo/Other  negative endocrine ROS  Renal/GU negative Renal ROS     Musculoskeletal  (+) Arthritis ,   Abdominal (+) + obese,   Peds  Hematology   Anesthesia Other Findings Past Medical History: No date: Arthritis     Comment:  knee s/p right knee surgery meniscal tear with bone               fragments, ankle (ortho Dr. Rudene Christians) No date: GERD (gastroesophageal reflux disease) No date: H. 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: Herpes infection No date: OSA on CPAP 06/2017: Sleep apnea     Comment:  PT JUST WAS DX WITH SLEEP APNEA LAST WEEK AND IS WAITING              ON HIS CPAP MACHINE   Reproductive/Obstetrics                             Anesthesia Physical  Anesthesia Plan  ASA: II  Anesthesia Plan: General   Post-op Pain Management:    Induction: Intravenous  PONV Risk Score and Plan: 2 and Ondansetron, Dexamethasone, Midazolam, Promethazine and Treatment may vary due to age or medical condition  Airway Management Planned: LMA  Additional Equipment:   Intra-op Plan:   Post-operative Plan: Extubation in OR  Informed Consent: I have reviewed the  patients History and Physical, chart, labs and discussed the procedure including the risks, benefits and alternatives for the proposed anesthesia with the patient or authorized representative who has indicated his/her understanding and acceptance.       Plan Discussed with: CRNA  Anesthesia Plan Comments:         Anesthesia Quick Evaluation

## 2018-12-12 NOTE — Transfer of Care (Signed)
Immediate Anesthesia Transfer of Care Note  Patient: Ugonna Keirsey  Procedure(s) Performed: KNEE ARTHROSCOPY WITH PARTIAL MEDIAL MENISECTOMY (Right ) CYST REMOVAL  Patient Location: PACU  Anesthesia Type:General  Level of Consciousness: awake, alert  and oriented  Airway & Oxygen Therapy: Patient Spontanous Breathing and Patient connected to face mask oxygen  Post-op Assessment: Report given to RN and Post -op Vital signs reviewed and stable  Post vital signs: Reviewed and stable  Last Vitals:  Vitals Value Taken Time  BP 130/79 12/12/18 1254  Temp    Pulse 105 12/12/18 1258  Resp 22 12/12/18 1258  SpO2 97 % 12/12/18 1258  Vitals shown include unvalidated device data.  Last Pain:  Vitals:   12/12/18 0944  TempSrc: Oral  PainSc: 8          Complications: No apparent anesthesia complications

## 2019-01-27 ENCOUNTER — Encounter: Payer: Self-pay | Admitting: Internal Medicine

## 2019-01-31 ENCOUNTER — Ambulatory Visit: Payer: BC Managed Care – PPO | Admitting: Urology

## 2019-02-01 ENCOUNTER — Ambulatory Visit: Payer: BC Managed Care – PPO | Admitting: Urology

## 2019-02-08 ENCOUNTER — Other Ambulatory Visit: Payer: Self-pay

## 2019-02-08 ENCOUNTER — Encounter: Payer: Self-pay | Admitting: Urology

## 2019-02-08 ENCOUNTER — Ambulatory Visit: Payer: BC Managed Care – PPO | Admitting: Urology

## 2019-02-08 VITALS — BP 126/79 | HR 118 | Ht 72.0 in | Wt 255.0 lb

## 2019-02-08 DIAGNOSIS — N5089 Other specified disorders of the male genital organs: Secondary | ICD-10-CM | POA: Insufficient documentation

## 2019-02-08 DIAGNOSIS — N529 Male erectile dysfunction, unspecified: Secondary | ICD-10-CM | POA: Diagnosis not present

## 2019-02-08 HISTORY — DX: Other specified disorders of the male genital organs: N50.89

## 2019-02-08 MED ORDER — TADALAFIL 5 MG PO TABS: 5.0000 mg | ORAL_TABLET | Freq: Every day | ORAL | 3 refills | Status: DC | PRN

## 2019-02-08 NOTE — Patient Instructions (Signed)
Tadalafil tablets (Cialis) What is this medicine? TADALAFIL (tah DA la fil) is used to treat erection problems in men. It is also used for enlargement of the prostate gland in men, a condition called benign prostatic hyperplasia or BPH. This medicine improves urine flow and reduces BPH symptoms. This medicine can also treat both erection problems and BPH when they occur together. This medicine may be used for other purposes; ask your health care provider or pharmacist if you have questions. COMMON BRAND NAME(S): Adcirca, ALYQ, Cialis What should I tell my health care provider before I take this medicine? They need to know if you have any of these conditions:  bleeding disorders  eye or vision problems, including a rare inherited eye disease called retinitis pigmentosa  anatomical deformation of the penis, Peyronie's disease, or history of priapism (painful and prolonged erection)  heart disease, angina, a history of heart attack, irregular heart beats, or other heart problems  high or low blood pressure  history of blood diseases, like sickle cell anemia or leukemia  history of stomach bleeding  kidney disease  liver disease  stroke  an unusual or allergic reaction to tadalafil, other medicines, foods, dyes, or preservatives  pregnant or trying to get pregnant  breast-feeding How should I use this medicine? Take this medicine by mouth with a glass of water. Follow the directions on the prescription label. You may take this medicine with or without meals. When this medicine is used for erection problems, your doctor may prescribe it to be taken once daily or as needed. If you are taking the medicine as needed, you may be able to have sexual activity 30 minutes after taking it and for up to 36 hours after taking it. Whether you are taking the medicine as needed or once daily, you should not take more than one dose per day. If you are taking this medicine for symptoms of benign  prostatic hyperplasia (BPH) or to treat both BPH and an erection problem, take the dose once daily at about the same time each day. Do not take your medicine more often than directed. Talk to your pediatrician regarding the use of this medicine in children. Special care may be needed. Overdosage: If you think you have taken too much of this medicine contact a poison control center or emergency room at once. NOTE: This medicine is only for you. Do not share this medicine with others. What if I miss a dose? If you are taking this medicine as needed for erection problems, this does not apply. If you miss a dose while taking this medicine once daily for an erection problem, benign prostatic hyperplasia, or both, take it as soon as you remember, but do not take more than one dose per day. What may interact with this medicine? Do not take this medicine with any of the following medications:  nitrates like amyl nitrite, isosorbide dinitrate, isosorbide mononitrate, nitroglycerin  other medicines for erectile dysfunction like avanafil, sildenafil, vardenafil  other tadalafil products (Adcirca)  riociguat This medicine may also interact with the following medications:  certain drugs for high blood pressure  certain drugs for the treatment of HIV infection or AIDS  certain drugs used for fungal or yeast infections, like fluconazole, itraconazole, ketoconazole, and voriconazole  certain drugs used for seizures like carbamazepine, phenytoin, and phenobarbital  grapefruit juice  macrolide antibiotics like clarithromycin, erythromycin, troleandomycin  medicines for prostate problems  rifabutin, rifampin or rifapentine This list may not describe all possible interactions. Give your   health care provider a list of all the medicines, herbs, non-prescription drugs, or dietary supplements you use. Also tell them if you smoke, drink alcohol, or use illegal drugs. Some items may interact with your  medicine. What should I watch for while using this medicine? If you notice any changes in your vision while taking this drug, call your doctor or health care professional as soon as possible. Stop using this medicine and call your health care provider right away if you have a loss of sight in one or both eyes. Contact your doctor or health care professional right away if the erection lasts longer than 4 hours or if it becomes painful. This may be a sign of serious problem and must be treated right away to prevent permanent damage. If you experience symptoms of nausea, dizziness, chest pain or arm pain upon initiation of sexual activity after taking this medicine, you should refrain from further activity and call your doctor or health care professional as soon as possible. Do not drink alcohol to excess (examples, 5 glasses of wine or 5 shots of whiskey) when taking this medicine. When taken in excess, alcohol can increase your chances of getting a headache or getting dizzy, increasing your heart rate or lowering your blood pressure. Using this medicine does not protect you or your partner against HIV infection (the virus that causes AIDS) or other sexually transmitted diseases. What side effects may I notice from receiving this medicine? Side effects that you should report to your doctor or health care professional as soon as possible:  allergic reactions like skin rash, itching or hives, swelling of the face, lips, or tongue  breathing problems  changes in hearing  changes in vision  chest pain  fast, irregular heartbeat  prolonged or painful erection  seizures Side effects that usually do not require medical attention (report to your doctor or health care professional if they continue or are bothersome):  back pain  dizziness  flushing  headache  indigestion  muscle aches  nausea  stuffy or runny nose This list may not describe all possible side effects. Call your doctor  for medical advice about side effects. You may report side effects to FDA at 1-800-FDA-1088. Where should I keep my medicine? Keep out of the reach of children. Store at room temperature between 15 and 30 degrees C (59 and 86 degrees F). Throw away any unused medicine after the expiration date. NOTE: This sheet is a summary. It may not cover all possible information. If you have questions about this medicine, talk to your doctor, pharmacist, or health care provider.  2020 Elsevier/Gold Standard (2013-09-29 13:15:49)  

## 2019-02-08 NOTE — Progress Notes (Signed)
   02/08/2019 9:51 AM   Joshua Ramirez Jul 19, 1975 [id number]  Reason for visit: Follow up ED, testicular microcalcifications  HPI: I saw Joshua Ramirez back in urology clinic today for follow-up of the above.  He is a very nice 43 year old male that I originally saw in September 2019 in referral for bilateral testicular calcification seen on CT scan.  He has no family history of testicular cancer.  Scrotal ultrasound and follow-up was benign aside from the micro calcifications.  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  His Cialis is working well for him for his erectile dysfunction.  He is taking it 5 mg every other day.    ROS: Please see flowsheet from today's date for complete review of systems.  Physical Exam: BP 126/79   Pulse (!) 118   Ht 6' (1.829 m)   Wt 255 lb (115.7 kg)   BMI 34.58 kg/m    Constitutional:  Alert and oriented, No acute distress. Respiratory: Normal respiratory effort, no increased work of breathing. GI: Abdomen is soft, nontender, nondistended, no abdominal masses GU: testicles descended, 20cc bilaterally, no masses Skin: No rashes, bruises or suspicious lesions. Neurologic: Grossly intact, no focal deficits, moving all 4 extremities. Psychiatric: Normal mood and affect  Assessment & Plan:   In summary, he is a 43 year old male with testicular microcalcifications on ultrasound and CT but no testicular masses.  Testicular exam is benign today.  We discussed the importance of monthly self testicular exams.  Regarding his erectile dysfunction, he would like to continue the Cialis 5 mg every other day.  We discussed that he can increase the dose to 10 mg if he is still having trouble with erections some of the time.  RTC 1 year for symptom check If doing well at that time, consider following as needed  A total of 15 minutes were spent face-to-face with the patient, greater than 50% was spent in patient education, counseling, and  coordination of care regarding ED and testicular self exams.  Billey Co, [facility] [address], [place], [place] [address] [phone]

## 2019-02-10 ENCOUNTER — Other Ambulatory Visit: Payer: Self-pay | Admitting: Internal Medicine

## 2019-02-10 DIAGNOSIS — F32A Depression, unspecified: Secondary | ICD-10-CM

## 2019-02-10 DIAGNOSIS — F329 Major depressive disorder, single episode, unspecified: Secondary | ICD-10-CM

## 2019-02-10 MED ORDER — BUPROPION HCL 75 MG PO TABS: 75.0000 mg | ORAL_TABLET | Freq: Every morning | ORAL | 3 refills | Status: DC

## 2019-03-15 ENCOUNTER — Other Ambulatory Visit: Payer: Self-pay

## 2019-03-17 ENCOUNTER — Ambulatory Visit (INDEPENDENT_AMBULATORY_CARE_PROVIDER_SITE_OTHER): Payer: BC Managed Care – PPO | Admitting: Internal Medicine

## 2019-03-17 ENCOUNTER — Encounter: Payer: Self-pay | Admitting: Internal Medicine

## 2019-03-17 ENCOUNTER — Other Ambulatory Visit: Payer: Self-pay

## 2019-03-17 VITALS — BP 128/78 | HR 90 | Temp 97.8°F | Ht 72.0 in | Wt 269.4 lb

## 2019-03-17 DIAGNOSIS — Z1329 Encounter for screening for other suspected endocrine disorder: Secondary | ICD-10-CM

## 2019-03-17 DIAGNOSIS — Z0001 Encounter for general adult medical examination with abnormal findings: Secondary | ICD-10-CM

## 2019-03-17 DIAGNOSIS — F419 Anxiety disorder, unspecified: Secondary | ICD-10-CM

## 2019-03-17 DIAGNOSIS — E785 Hyperlipidemia, unspecified: Secondary | ICD-10-CM | POA: Diagnosis not present

## 2019-03-17 DIAGNOSIS — Z1389 Encounter for screening for other disorder: Secondary | ICD-10-CM

## 2019-03-17 DIAGNOSIS — Z1159 Encounter for screening for other viral diseases: Secondary | ICD-10-CM

## 2019-03-17 DIAGNOSIS — Z1211 Encounter for screening for malignant neoplasm of colon: Secondary | ICD-10-CM | POA: Diagnosis not present

## 2019-03-17 DIAGNOSIS — F32A Depression, unspecified: Secondary | ICD-10-CM

## 2019-03-17 DIAGNOSIS — Z0000 Encounter for general adult medical examination without abnormal findings: Secondary | ICD-10-CM

## 2019-03-17 DIAGNOSIS — Z0184 Encounter for antibody response examination: Secondary | ICD-10-CM

## 2019-03-17 DIAGNOSIS — F329 Major depressive disorder, single episode, unspecified: Secondary | ICD-10-CM

## 2019-03-17 DIAGNOSIS — E559 Vitamin D deficiency, unspecified: Secondary | ICD-10-CM

## 2019-03-17 DIAGNOSIS — Z125 Encounter for screening for malignant neoplasm of prostate: Secondary | ICD-10-CM

## 2019-03-17 HISTORY — DX: Encounter for general adult medical examination without abnormal findings: Z00.00

## 2019-03-17 MED ORDER — BUPROPION HCL ER (XL) 150 MG PO TB24: 150.0000 mg | ORAL_TABLET | Freq: Every day | ORAL | 3 refills | Status: DC

## 2019-03-17 MED ORDER — ALPRAZOLAM 0.25 MG PO TABS: 0.2500 mg | ORAL_TABLET | Freq: Every evening | ORAL | 2 refills | Status: DC | PRN

## 2019-03-17 NOTE — Progress Notes (Signed)
Chief Complaint  Patient presents with  . Follow-up   Annual  1. Depression at times on wellbutrin 75 mg qd wants to increase and wants prn xanax for anxiety which helps going through divorce 2.obesity and HLD disc The next 56 days  3. Wants colonoscopy    Review of Systems  Constitutional: Negative for weight loss.  HENT: Negative for hearing loss.   Eyes: Negative for blurred vision.  Respiratory: Negative for shortness of breath.   Cardiovascular: Negative for chest pain.  Gastrointestinal: Negative for abdominal pain and blood in stool.  Musculoskeletal: Negative for falls.  Skin: Negative for rash.  Neurological: Negative for headaches.  Psychiatric/Behavioral: Positive for depression. The patient is nervous/anxious.    Past Medical History:  Diagnosis Date  . Arthritis    knee s/p right knee surgery meniscal tear with bone fragments, ankle (ortho Dr. Rudene Christians)  . GERD (gastroesophageal reflux disease)   . H. pylori infection   . Herpes infection   . OSA on CPAP   . Sleep apnea 06/2017   PT JUST WAS DX WITH SLEEP APNEA LAST WEEK AND IS WAITING ON HIS CPAP MACHINE   Past Surgical History:  Procedure Laterality Date  . CYST EXCISION  12/12/2018   Procedure: CYST REMOVAL;  Surgeon: Leim Fabry, MD;  Location: [facility] ORS;  Service: Orthopedics;;  . ESOPHAGOGASTRODUODENOSCOPY (EGD) WITH PROPOFOL N/A 01/26/2018   Procedure: ESOPHAGOGASTRODUODENOSCOPY (EGD) WITH PROPOFOL;  Surgeon: [name], MD;  Location: [facility] ENDOSCOPY;  Service: Gastroenterology;  Laterality: N/A;  . KIDNEY DONATION Left    2004  . KNEE ARTHROSCOPY WITH MEDIAL MENISECTOMY Right 07/15/2017   Procedure: KNEE ARTHROSCOPY WITH MEDIAL MENISECTOMY;  Surgeon: Hessie Knows, MD;  Location: [facility] ORS;  Service: Orthopedics;  Laterality: Right;  . KNEE ARTHROSCOPY WITH MEDIAL MENISECTOMY Right 12/12/2018   Procedure: KNEE ARTHROSCOPY WITH PARTIAL MEDIAL MENISECTOMY;  Surgeon: Leim Fabry, MD;  Location: [facility] ORS;   Service: Orthopedics;  Laterality: Right;  . meniscal tear     right knee Dr. Rudene Christians 2014   . MENISECTOMY Right 07/15/2017   Procedure: MENISECTOMY;  Surgeon: Hessie Knows, MD;  Location: [facility] ORS;  Service: Orthopedics;  Laterality: Right;  . ROBOTIC ASSISTED LAPAROSCOPIC NISSEN FUNDOPLICATION N/A [id number]   Procedure: ROBOTIC ASSISTED LAPAROSCOPIC NISSEN FUNDOPLICATION WITH HIATAL HERNIA REPAIR;  Surgeon: Jules Husbands, MD;  Location: [facility] ORS;  Service: General;  Laterality: N/A;  . UMBILICAL HERNIA REPAIR N/A 03/03/2018   Procedure: HERNIA REPAIR UMBILICAL ADULT;  Surgeon: Jules Husbands, MD;  Location: [facility] ORS;  Service: General;  Laterality: N/A;   Family History  Problem Relation Age of Onset  . Drug abuse Mother   . Hyperlipidemia Mother   . Hypertension Mother   . Cancer Maternal Aunt        brain, breast, lung ? primary was smoker   . Prostate cancer Neg Hx   . Bladder Cancer Neg Hx   . Kidney cancer Neg Hx    Social History   Socioeconomic History  . Marital status: Married    Spouse name: Not on file  . Number of children: Not on file  . Years of education: Not on file  . Highest education level: Not on file  Occupational History  . Not on file  Social Needs  . Financial resource strain: Not on file  . Food insecurity    Worry: Not on file    Inability: Not on file  . Transportation needs    Medical: Not on file  Non-medical: Not on file  Tobacco Use  . Smoking status: Never Smoker  . Smokeless tobacco: Never Used  . Tobacco comment: light in the past social smoker quit  Substance and Sexual Activity  . Alcohol use: Yes    Alcohol/week: 1.0 - 2.0 standard drinks    Types: 1 - 2 Shots of liquor per week  . Drug use: No  . Sexual activity: Yes  Lifestyle  . Physical activity    Days per week: Not on file    Minutes per session: Not on file  . Stress: Not on file  Relationships  . Social Herbalist on phone: Not on file    Gets together:  Not on file    Attends religious service: Not on file    Active member of club or organization: Not on file    Attends meetings of clubs or organizations: Not on file    Relationship status: Not on file  . Intimate partner violence    Fear of current or ex partner: Not on file    Emotionally abused: Not on file    Physically abused: Not on file    Forced sexual activity: Not on file  Other Topics Concern  . 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 acetaminophen (TYLENOL) 500 MG tablet Take 2 tablets (1,000 mg total) by mouth every 8 (eight) hours.  . Olopatadine HCl 0.2 % SOLN Place 1 drop into both eyes daily as needed (runny eyes).  . tadalafil (CIALIS) 5 MG tablet Take 1 tablet (5 mg total) by mouth daily as needed for erectile dysfunction.  . tadalafil (CIALIS) 5 MG tablet Take 1 tablet (5 mg total) by mouth daily as needed for erectile dysfunction.  . valACYclovir (VALTREX) 500 MG tablet Take 1 tablet (500 mg total) by mouth daily. For prevention. With outbreak take w/in 1 day of lesion 500 mg bid x 3-7 days (Patient taking differently: Take 500 mg by mouth See admin instructions. Take 500 mg daily for prevention. With outbreak take 500 mg twice daily for 3 to 7 days)  . [DISCONTINUED] buPROPion (WELLBUTRIN) 75 MG tablet Take 1 tablet (75 mg total) by mouth every morning.   No Known Allergies No results found for this or any previous visit (from the past 2160 hour(s)). Objective  Body mass index is 36.54 kg/m. Wt Readings from Last 3 Encounters:  03/17/19 269 lb 6.4 oz (122.2 kg)  02/08/19 255 lb (115.7 kg)  12/12/18 250 lb (113.4 kg)   Temp Readings from Last 3 Encounters:  03/17/19 97.8 F (36.6 C) (Oral)  12/12/18 (!) 97.4 F (36.3 C) (Temporal)  06/24/18 98.2 F (36.8 C) (Oral)   BP  Readings from Last 3 Encounters:  03/17/19 128/78  02/08/19 126/79  12/12/18 133/88   Pulse Readings from Last 3 Encounters:  03/17/19 90  02/08/19 (!) 118  12/12/18 73    Physical Exam Vitals signs and nursing note reviewed.  Constitutional:      Appearance: Normal appearance. He is well-developed and well-groomed. He is obese.     Comments: +mask on    HENT:     Head: Normocephalic.  Eyes:     Conjunctiva/sclera: Conjunctivae normal.     Pupils: Pupils are equal, round, and reactive  to light.  Cardiovascular:     Rate and Rhythm: Normal rate and regular rhythm.     Heart sounds: Normal heart sounds. No murmur.  Pulmonary:     Effort: Pulmonary effort is normal.     Breath sounds: Normal breath sounds.  Abdominal:     General: Abdomen is flat. Bowel sounds are normal.     Tenderness: There is no abdominal tenderness.  Skin:    General: Skin is warm and dry.  Neurological:     General: No focal deficit present.     Mental Status: He is alert and oriented to person, place, and time. Mental status is at baseline.     Gait: Gait normal.  Psychiatric:        Attention and Perception: Attention and perception normal.        Mood and Affect: Mood and affect normal.        Speech: Speech normal.        Behavior: Behavior normal. Behavior is cooperative.        Thought Content: Thought content normal.        Cognition and Memory: Cognition and memory normal.        Judgment: Judgment normal.     Assessment  Plan  Annual physical exam Fasting labs 06/25/19 and f/u after labs Flu shot in future pt declines today but will get UTD Tdap MMR immune and hep B status  Std utd  Colonoscopy referred  rec healthy diet and exercise  Disc the next 56 days  PSA check with next labs and DRE in future   Anxiety and depression - Plan: ALPRAZolam (XANAX) 0.25 MG tablet, buPROPion (WELLBUTRIN XL) 150 MG 24 hr tablet  Encounter for screening colonoscopy - Plan: Ambulatory referral  to Gastroenterology Dr. Bailey Mech   Provider: Dr. Olivia Mackie McLean-Scocuz[facility]

## 2019-03-17 NOTE — Patient Instructions (Signed)
Dr. [name] [facility] [place]   Colonoscopy, Adult A colonoscopy is an exam to look at the entire large intestine. During the exam, a lubricated, flexible tube that has a camera on the end of it is inserted into the anus and then passed into the rectum, colon, and other parts of the large intestine. You may have a colonoscopy as a part of normal colorectal screening or if you have certain symptoms, such as:  Lack of red blood cells (anemia).  Diarrhea that does not go away.  Abdominal pain.  Blood in your stool (feces). A colonoscopy can help screen for and diagnose medical problems, including:  Tumors.  Polyps.  Inflammation.  Areas of bleeding. Tell a health care provider about:  Any allergies you have.  All medicines you are taking, including vitamins, herbs, eye drops, creams, and over-the-counter medicines.  Any problems you or family members have had with anesthetic medicines.  Any blood disorders you have.  Any surgeries you have had.  Any medical conditions you have.  Any problems you have had passing stool. What are the risks? Generally, this is a safe procedure. However, problems may occur, including:  Bleeding.  A tear in the intestine.  A reaction to medicines given during the exam.  Infection (rare). What happens before the procedure? Eating and drinking restrictions Follow instructions from your health care provider about eating and drinking, which may include:  A few days before the procedure - follow a low-fiber diet. Avoid nuts, seeds, dried fruit, raw fruits, and vegetables.  1-3 days before the procedure - follow a clear liquid diet. Drink only clear liquids, such as clear broth or bouillon, black coffee or tea, clear juice, clear soft drinks or sports drinks, gelatin dessert, and popsicles. Avoid any liquids that contain red or purple dye.  On the day of the procedure - do not eat or drink anything starting 2 hours before the procedure, or  within the time period that your health care provider recommends. Up to 2 hours before the procedure, you may continue to drink clear liquids, such as water or clear fruit juice. Bowel prep If you were prescribed an oral bowel prep to clean out your colon:  Take it as told by your health care provider. Starting the day before your procedure, you will need to drink a large amount of medicated liquid. The liquid will cause you to have multiple loose stools until your stool is almost clear or light green.  If your skin or anus gets irritated from diarrhea, you may use these to relieve the irritation: ? Medicated wipes, such as adult wet wipes with aloe and vitamin E. ? A skin-soothing product like petroleum jelly.  If you vomit while drinking the bowel prep, take a break for up to 60 minutes and then begin the bowel prep again. If vomiting continues and you cannot take the bowel prep without vomiting, call your health care provider.  To clean out your colon, you may also be given: ? Laxative medicines. ? Instructions about how to use an enema. General instructions  Ask your health care provider about: ? Changing or stopping your regular medicines or supplements. This is especially important if you are taking iron supplements, diabetes medicines, or blood thinners. ? Taking medicines such as aspirin and ibuprofen. These medicines can thin your blood. Do not take these medicines before the procedure if your health care provider tells you not to.  Plan to have someone take you home from the  hospital or clinic. What happens during the procedure?   An IV may be inserted into one of your veins.  You will be given medicine to help you relax (sedative).  To reduce your risk of infection: ? Your health care team will wash or sanitize their hands. ? Your anal area will be washed with soap.  You will be asked to lie on your side with your knees bent.  Your health care provider will lubricate a  long, thin, flexible tube. The tube will have a camera and a light on the end.  The tube will be inserted into your anus.  The tube will be gently eased through your rectum and colon.  Air will be delivered into your colon to keep it open. You may feel some pressure or cramping.  The camera will be used to take images during the procedure.  A small tissue sample may be removed to be examined under a microscope (biopsy).  If small polyps are found, your health care provider may remove them and have them checked for cancer cells.  When the exam is done, the tube will be removed. The procedure may vary among health care providers and hospitals. What happens after the procedure?  Your blood pressure, heart rate, breathing rate, and blood oxygen level will be monitored until the medicines you were given have worn off.  Do not drive for 24 hours after the exam.  You may have a small amount of blood in your stool.  You may pass gas and have mild abdominal cramping or bloating due to the air that was used to inflate your colon during the exam.  It is up to you to get the results of your procedure. Ask your health care provider, or the department performing the procedure, when your results will be ready. Summary  A colonoscopy is an exam to look at the entire large intestine.  During a colonoscopy, a lubricated, flexible tube with a camera on the end of it is inserted into the anus and then passed into the colon and other parts of the large intestine.  Follow instructions from your health care provider about eating and drinking before the procedure.  If you were prescribed an oral bowel prep to clean out your colon, take it as told by your health care provider.  After your procedure, your blood pressure, heart rate, breathing rate, and blood oxygen level will be monitored until the medicines you were given have worn off. This information is not intended to replace advice given to you by  your health care provider. Make sure you discuss any questions you have with your health care provider. Document Released: 05/08/2000 Document Revised: 03/03/2017 Document Reviewed: 07/23/2015 Elsevier Patient Education  2020 Elsevier Inc.  Bupropion extended-release tablets (Depression/Mood Disorders) What is this medicine? BUPROPION (byoo PROE pee on) is used to treat depression. This medicine may be used for other purposes; ask your health care provider or pharmacist if you have questions. COMMON BRAND NAME(S): Aplenzin, Budeprion XL, Forfivo XL, Wellbutrin XL What should I tell my health care provider before I take this medicine? They need to know if you have any of these conditions:  an eating disorder, such as anorexia or bulimia  bipolar disorder or psychosis  diabetes or high blood sugar, treated with medication  glaucoma  head injury or brain tumor  heart disease, previous heart attack, or irregular heart beat  high blood pressure  kidney or liver disease  seizures (convulsions)  suicidal thoughts  or a previous suicide attempt  Tourette's syndrome  weight loss  an unusual or allergic reaction to bupropion, other medicines, foods, dyes, or preservatives  breast-feeding  pregnant or trying to become pregnant How should I use this medicine? Take this medicine by mouth with a glass of water. Follow the directions on the prescription label. You can take it with or without food. If it upsets your stomach, take it with food. Do not crush, chew, or cut these tablets. This medicine is taken once daily at the same time each day. Do not take your medicine more often than directed. Do not stop taking this medicine suddenly except upon the advice of your doctor. Stopping this medicine too quickly may cause serious side effects or your condition may worsen. A special MedGuide will be given to you by the pharmacist with each prescription and refill. Be sure to read this  information carefully each time. Talk to your pediatrician regarding the use of this medicine in children. Special care may be needed. Overdosage: If you think you have taken too much of this medicine contact a poison control center or emergency room at once. NOTE: This medicine is only for you. Do not share this medicine with others. What if I miss a dose? If you miss a dose, skip the missed dose and take your next tablet at the regular time. Do not take double or extra doses. What may interact with this medicine? Do not take this medicine with any of the following medications:  linezolid  MAOIs like Azilect, Carbex, Eldepryl, Marplan, Nardil, and Parnate  methylene blue (injected into a vein)  other medicines that contain bupropion like Zyban This medicine may also interact with the following medications:  alcohol  certain medicines for anxiety or sleep  certain medicines for blood pressure like metoprolol, propranolol  certain medicines for depression or psychotic disturbances  certain medicines for HIV or AIDS like efavirenz, lopinavir, nelfinavir, ritonavir  certain medicines for irregular heart beat like propafenone, flecainide  certain medicines for Parkinson's disease like amantadine, levodopa  certain medicines for seizures like carbamazepine, phenytoin, phenobarbital  cimetidine  clopidogrel  cyclophosphamide  digoxin  furazolidone  isoniazid  nicotine  orphenadrine  procarbazine  steroid medicines like prednisone or cortisone  stimulant medicines for attention disorders, weight loss, or to stay awake  tamoxifen  theophylline  thiotepa  ticlopidine  tramadol  warfarin This list may not describe all possible interactions. Give your health care provider a list of all the medicines, herbs, non-prescription drugs, or dietary supplements you use. Also tell them if you smoke, drink alcohol, or use illegal drugs. Some items may interact with your  medicine. What should I watch for while using this medicine? Tell your doctor if your symptoms do not get better or if they get worse. Visit your doctor or healthcare provider for regular checks on your progress. Because it may take several weeks to see the full effects of this medicine, it is important to continue your treatment as prescribed by your doctor. This medicine may cause serious skin reactions. They can happen weeks to months after starting the medicine. Contact your healthcare provider right away if you notice fevers or flu-like symptoms with a rash. The rash may be red or purple and then turn into blisters or peeling of the skin. Or, you might notice a red rash with swelling of the face, lips or lymph nodes in your neck or under your arms. Patients and their families should watch out for  new or worsening thoughts of suicide or depression. Also watch out for sudden changes in feelings such as feeling anxious, agitated, panicky, irritable, hostile, aggressive, impulsive, severely restless, overly excited and hyperactive, or not being able to sleep. If this happens, especially at the beginning of treatment or after a change in dose, call your healthcare provider. Avoid alcoholic drinks while taking this medicine. Drinking large amounts of alcoholic beverages, using sleeping or anxiety medicines, or quickly stopping the use of these agents while taking this medicine may increase your risk for a seizure. Do not drive or use heavy machinery until you know how this medicine affects you. This medicine can impair your ability to perform these tasks. Do not take this medicine close to bedtime. It may prevent you from sleeping. Your mouth may get dry. Chewing sugarless gum or sucking hard candy, and drinking plenty of water may help. Contact your doctor if the problem does not go away or is severe. The tablet shell for some brands of this medicine does not dissolve. This is normal. The tablet shell may  appear whole in the stool. This is not a cause for concern. What side effects may I notice from receiving this medicine? Side effects that you should report to your doctor or health care professional as soon as possible:  allergic reactions like skin rash, itching or hives, swelling of the face, lips, or tongue  breathing problems  changes in vision  confusion  elevated mood, decreased need for sleep, racing thoughts, impulsive behavior  fast or irregular heartbeat  hallucinations, loss of contact with reality  increased blood pressure  rash, fever, and swollen lymph nodes  redness, blistering, peeling or loosening of the skin, including inside the mouth  seizures  suicidal thoughts or other mood changes  unusually weak or tired  vomiting Side effects that usually do not require medical attention (report to your doctor or health care professional if they continue or are bothersome):  constipation  headache  loss of appetite  nausea  tremors  weight loss This list may not describe all possible side effects. Call your doctor for medical advice about side effects. You may report side effects to FDA at 1-800-FDA-1088. Where should I keep my medicine? Keep out of the reach of children. Store at room temperature between 15 and 30 degrees C (59 and 86 degrees F). Throw away any unused medicine after the expiration date. NOTE: This sheet is a summary. It may not cover all possible information. If you have questions about this medicine, talk to your doctor, pharmacist, or health care provider.  2020 Elsevier/Gold Standard (2018-08-04 13:45:31)

## 2019-03-17 NOTE — Progress Notes (Signed)
Pre visit review using our clinic review tool, if applicable. No additional management support is needed unless otherwise documented below in the visit note. 

## 2019-03-21 ENCOUNTER — Telehealth: Payer: Self-pay

## 2019-03-21 ENCOUNTER — Other Ambulatory Visit: Payer: Self-pay

## 2019-03-21 DIAGNOSIS — Z1211 Encounter for screening for malignant neoplasm of colon: Secondary | ICD-10-CM

## 2019-03-21 MED ORDER — NA SULFATE-K SULFATE-MG SULF 17.5-3.13-1.6 GM/177ML PO SOLN: 1.0000 | Freq: Once | ORAL | 0 refills | Status: AC

## 2019-03-21 NOTE — Telephone Encounter (Signed)
Gastroenterology Pre-Procedure Review  Request Date: 04/03/19 Requesting Physician: Dr. Vicente Males  PATIENT REVIEW QUESTIONS: The patient responded to the following health history questions as indicated:    1. Are you having any GI issues? no 2. Do you have a personal history of Polyps? no 3. Do you have a family history of Colon Cancer or Polyps? yes (grandfather colon polyps) 4. Diabetes Mellitus? no 5. Joint replacements in the past 12 months?Knee Surgery 2020, Nissen Surgery 03/03/18 with Dr. Dahlia Byes 6. Major health problems in the past 3 months?Knee Surgery 2020, Nissen Surgery 03/03/18 with Dr. Dahlia Byes 7. Any artificial heart valves, MVP, or defibrillator?no    MEDICATIONS & ALLERGIES:    Patient reports the following regarding taking any anticoagulation/antiplatelet therapy:   Plavix, Coumadin, Eliquis, Xarelto, Lovenox, Pradaxa, Brilinta, or Effient? no Aspirin? no  Patient confirms/reports the following medications:  Current Outpatient Medications  Medication Sig Dispense Refill  . acetaminophen (TYLENOL) 500 MG tablet Take 2 tablets (1,000 mg total) by mouth every 8 (eight) hours. 90 tablet 2  . ALPRAZolam (XANAX) 0.25 MG tablet Take 1 tablet (0.25 mg total) by mouth at bedtime as needed for anxiety. 30 tablet 2  . buPROPion (WELLBUTRIN XL) 150 MG 24 hr tablet Take 1 tablet (150 mg total) by mouth daily. 90 tablet 3  . Olopatadine HCl 0.2 % SOLN Place 1 drop into both eyes daily as needed (runny eyes). 1 Bottle 11  . tadalafil (CIALIS) 5 MG tablet Take 1 tablet (5 mg total) by mouth daily as needed for erectile dysfunction. 30 tablet 11  . tadalafil (CIALIS) 5 MG tablet Take 1 tablet (5 mg total) by mouth daily as needed for erectile dysfunction. 90 tablet 3  . valACYclovir (VALTREX) 500 MG tablet Take 1 tablet (500 mg total) by mouth daily. For prevention. With outbreak take w/in 1 day of lesion 500 mg bid x 3-7 days (Patient taking differently: Take 500 mg by mouth See admin  instructions. Take 500 mg daily for prevention. With outbreak take 500 mg twice daily for 3 to 7 days) 120 tablet 3   No current facility-administered medications for this visit.     Patient confirms/reports the following allergies:  No Known Allergies  No orders of the defined types were placed in this encounter.   AUTHORIZATION INFORMATION Primary Insurance: 1D#: Group #:  Secondary Insurance: 1D#: Group #:  SCHEDULE INFORMATION: Date: Monday 04/03/19 Time: Location:[facility]

## 2019-03-27 ENCOUNTER — Telehealth: Payer: Self-pay | Admitting: Gastroenterology

## 2019-03-27 NOTE — Telephone Encounter (Signed)
Patient has been informed that his colonoscopy is coded as a preventive colonoscopy.  Thanked him for contacting his insurance to discuss.  Thanks [employer]

## 2019-03-27 NOTE — Telephone Encounter (Signed)
Pt left vm he is scheduled for a procedure on  04/03/19 he was told to call his insurance company to see if it will be covered he states he did call and as long as it is coded as preventive they will cover it please call pt

## 2019-03-30 ENCOUNTER — Other Ambulatory Visit: Payer: Self-pay

## 2019-03-30 ENCOUNTER — Other Ambulatory Visit
Admission: RE | Admit: 2019-03-30 | Discharge: 2019-03-30 | Disposition: A | Discharge status: Home / Self Care | Payer: BC Managed Care – PPO | Source: Ambulatory Visit | Attending: Gastroenterology | Admitting: Gastroenterology

## 2019-03-30 DIAGNOSIS — Z01812 Encounter for preprocedural laboratory examination: Secondary | ICD-10-CM | POA: Diagnosis not present

## 2019-03-30 DIAGNOSIS — Z20828 Contact with and (suspected) exposure to other viral communicable diseases: Secondary | ICD-10-CM | POA: Diagnosis not present

## 2019-03-30 LAB — SARS CORONAVIRUS 2 (TAT 6-24 HRS): SARS Coronavirus 2: NEGATIVE

## 2019-04-03 ENCOUNTER — Encounter
Admission: RE | Disposition: A | Discharge status: Home / Self Care | Payer: Self-pay | Source: Home / Self Care | Attending: Gastroenterology

## 2019-04-03 ENCOUNTER — Ambulatory Visit: Payer: BC Managed Care – PPO | Admitting: Anesthesiology

## 2019-04-03 ENCOUNTER — Ambulatory Visit
Admission: RE | Admit: 2019-04-03 | Discharge: 2019-04-03 | Disposition: A | Discharge status: Home / Self Care | Payer: BC Managed Care – PPO | Attending: Gastroenterology | Admitting: Gastroenterology

## 2019-04-03 ENCOUNTER — Other Ambulatory Visit: Payer: Self-pay

## 2019-04-03 ENCOUNTER — Encounter: Payer: Self-pay | Admitting: Anesthesiology

## 2019-04-03 DIAGNOSIS — Z79899 Other long term (current) drug therapy: Secondary | ICD-10-CM | POA: Insufficient documentation

## 2019-04-03 DIAGNOSIS — G4733 Obstructive sleep apnea (adult) (pediatric): Secondary | ICD-10-CM | POA: Diagnosis not present

## 2019-04-03 DIAGNOSIS — M1990 Unspecified osteoarthritis, unspecified site: Secondary | ICD-10-CM | POA: Diagnosis not present

## 2019-04-03 DIAGNOSIS — Z1211 Encounter for screening for malignant neoplasm of colon: Secondary | ICD-10-CM | POA: Diagnosis not present

## 2019-04-03 DIAGNOSIS — K219 Gastro-esophageal reflux disease without esophagitis: Secondary | ICD-10-CM | POA: Diagnosis not present

## 2019-04-03 HISTORY — PX: COLONOSCOPY WITH PROPOFOL: SHX5780

## 2019-04-03 SURGERY — COLONOSCOPY WITH PROPOFOL
Anesthesia: General

## 2019-04-03 MED ORDER — PROPOFOL 10 MG/ML IV BOLUS
INTRAVENOUS | Status: AC
  Filled 2019-04-03: qty 20

## 2019-04-03 MED ORDER — LACTATED RINGERS IV SOLN
INTRAVENOUS | Status: DC | PRN
  Administered 2019-04-03: 09:00:00 via INTRAVENOUS

## 2019-04-03 MED ORDER — PROPOFOL 10 MG/ML IV BOLUS
INTRAVENOUS | Status: DC | PRN
  Administered 2019-04-03 (×2): 50 mg via INTRAVENOUS
  Administered 2019-04-03: 30 mg via INTRAVENOUS
  Administered 2019-04-03 (×5): 50 mg via INTRAVENOUS
  Administered 2019-04-03: 20 mg via INTRAVENOUS
  Administered 2019-04-03 (×2): 50 mg via INTRAVENOUS

## 2019-04-03 MED ORDER — PROPOFOL 500 MG/50ML IV EMUL
INTRAVENOUS | Status: AC
  Filled 2019-04-03: qty 50

## 2019-04-03 MED ORDER — SODIUM CHLORIDE 0.9 % IV SOLN
INTRAVENOUS | Status: DC
  Administered 2019-04-03: 1000 mL via INTRAVENOUS
  Administered 2019-04-03: 09:00:00 via INTRAVENOUS

## 2019-04-03 NOTE — H&P (Signed)
Joshua Bellows, Ramirez [address], [place], [place], [place], [address] [address], [place], [place], [place], [address] Phone: [phone]  Fax: [phone]  Primary Care Physician:  Joshua Ramirez, Joshua Glow, Ramirez   Pre-Procedure History & Physical: HPI:  Joshua Ramirez is a 43 y.o. male is here for an colonoscopy.   Past Medical History:  Diagnosis Date  . Arthritis    knee s/p right knee surgery meniscal tear with bone fragments, ankle (ortho Dr. Rudene Ramirez)  . GERD (gastroesophageal reflux disease)   . H. pylori infection   . Herpes infection   . OSA on CPAP   . Sleep apnea 06/2017   PT JUST WAS DX WITH SLEEP APNEA LAST WEEK AND IS WAITING ON HIS CPAP MACHINE    Past Surgical History:  Procedure Laterality Date  . CYST EXCISION  12/12/2018   Procedure: CYST REMOVAL;  Surgeon: Joshua Fabry, Ramirez;  Location: [facility] ORS;  Service: Orthopedics;;  . ESOPHAGOGASTRODUODENOSCOPY (EGD) WITH PROPOFOL N/A 01/26/2018   Procedure: ESOPHAGOGASTRODUODENOSCOPY (EGD) WITH PROPOFOL;  Surgeon: Joshua Bellows, Ramirez;  Location: [facility] ENDOSCOPY;  Service: Gastroenterology;  Laterality: N/A;  . KIDNEY DONATION Left    2004  . KNEE ARTHROSCOPY WITH MEDIAL MENISECTOMY Right 07/15/2017   Procedure: KNEE ARTHROSCOPY WITH MEDIAL MENISECTOMY;  Surgeon: Joshua Knows, Ramirez;  Location: [facility] ORS;  Service: Orthopedics;  Laterality: Right;  . KNEE ARTHROSCOPY WITH MEDIAL MENISECTOMY Right 12/12/2018   Procedure: KNEE ARTHROSCOPY WITH PARTIAL MEDIAL MENISECTOMY;  Surgeon: Joshua Fabry, Ramirez;  Location: [facility] ORS;  Service: Orthopedics;  Laterality: Right;  . meniscal tear     right knee Dr. Rudene Ramirez 2014   . MENISECTOMY Right 07/15/2017   Procedure: MENISECTOMY;  Surgeon: Joshua Knows, Ramirez;  Location: [facility] ORS;  Service: Orthopedics;  Laterality: Right;  . ROBOTIC ASSISTED LAPAROSCOPIC NISSEN FUNDOPLICATION N/A 92/42/6834   Procedure: ROBOTIC ASSISTED LAPAROSCOPIC NISSEN FUNDOPLICATION WITH HIATAL HERNIA REPAIR;  Surgeon: Joshua Husbands, Ramirez;  Location: [facility] ORS;  Service: General;  Laterality: N/A;  . UMBILICAL HERNIA REPAIR N/A 03/03/2018   Procedure: HERNIA REPAIR UMBILICAL ADULT;  Surgeon: Joshua Husbands, Ramirez;  Location: [facility] ORS;  Service: General;  Laterality: N/A;    Prior to Admission medications   Medication Sig Start Date End Date Taking? Authorizing Provider  acetaminophen (TYLENOL) 500 MG tablet Take 2 tablets (1,000 mg total) by mouth every 8 (eight) hours. 12/12/18 12/12/19 Yes Joshua Fabry, Ramirez  ALPRAZolam Duanne Moron) 0.25 MG tablet Take 1 tablet (0.25 mg total) by mouth at bedtime as needed for anxiety. 03/17/19  Yes Joshua Ramirez, Joshua Glow, Ramirez  buPROPion (WELLBUTRIN XL) 150 MG 24 hr tablet Take 1 tablet (150 mg total) by mouth daily. 03/17/19  Yes Joshua Ramirez, Joshua Glow, Ramirez  Olopatadine HCl 0.2 % SOLN Place 1 drop into both eyes daily as needed (runny eyes). 08/23/18  Yes Joshua Ramirez, Joshua Glow, Ramirez  tadalafil (CIALIS) 5 MG tablet Take 1 tablet (5 mg total) by mouth daily as needed for erectile dysfunction. 02/01/18  Yes Billey Co, Ramirez  tadalafil (CIALIS) 5 MG tablet Take 1 tablet (5 mg total) by mouth daily as needed for erectile dysfunction. 02/08/19  Yes Billey Co, Ramirez  valACYclovir (VALTREX) 500 MG tablet Take 1 tablet (500 mg total) by mouth daily. For prevention. With outbreak take w/in 1 day of lesion 500 mg bid x 3-7 days Patient taking differently: Take 500 mg by mouth See admin instructions. Take 500 mg daily for prevention. With outbreak take 500 mg  twice daily for 3 to 7 days 06/27/18  Yes Joshua Ramirez, Pasty Spillers, Ramirez    Allergies as of 03/22/2019  . (No Known Allergies)    Family History  Problem Relation Age of Onset  . Drug abuse Mother   . Hyperlipidemia Mother   . Hypertension Mother   . Cancer Maternal Aunt        brain, breast, lung ? primary was smoker   . Prostate cancer Neg Hx   . Bladder Cancer Neg Hx   . Kidney cancer Neg Hx     Social History   Socioeconomic  History  . Marital status: Married    Spouse name: Not on file  . Number of children: Not on file  . Years of education: Not on file  . Highest education level: Not on file  Occupational History  . Not on file  Social Needs  . Financial resource strain: Not on file  . Food insecurity    Worry: Not on file    Inability: Not on file  . Transportation needs    Medical: Not on file    Non-medical: Not on file  Tobacco Use  . Smoking status: Never Smoker  . Smokeless tobacco: Never Used  . Tobacco comment: light in the past social smoker quit  Substance and Sexual Activity  . Alcohol use: Yes    Alcohol/week: 1.0 - 2.0 standard drinks    Types: 1 - 2 Shots of liquor per week  . Drug use: No  . Sexual activity: Yes  Lifestyle  . Physical activity    Days per week: Not on file    Minutes per session: Not on file  . Stress: Not on file  Relationships  . Social Musician on phone: Not on file    Gets together: Not on file    Attends religious service: Not on file    Active member of club or organization: Not on file    Attends meetings of clubs or organizations: Not on file    Relationship status: Not on file  . Intimate partner violence    Fear of current or ex partner: Not on file    Emotionally abused: Not on file    Physically abused: Not on file    Forced sexual activity: Not on file  Other Topics Concern  . Not on file  Social History Narrative   2 year college    Works Civil Service fast streamer, coaches girls basketball, football, baseball at [employer]   From [place]    Review of Systems: See HPI, otherwise negative ROS  Physical Exam: BP (!) 131/93   Pulse 92   Temp (!) 97.4 F (36.3 C) (Temporal)   Resp 19   Ht 6' (1.829 m)   Wt 117.9 kg   SpO2 97%   BMI 35.26 kg/m  General:   Alert,  pleasant and cooperative in NAD Head:  Normocephalic and atraumatic. Neck:  Supple; no masses or thyromegaly. Lungs:  Clear  throughout to auscultation, normal respiratory effort.    Heart:  +S1, +S2, Regular rate and rhythm, No edema. Abdomen:  Soft, nontender and nondistended. Normal bowel sounds, without guarding, and without rebound.   Neurologic:  Alert and  oriented x4;  grossly normal neurologically.  Impression/Plan: Joshua Ramirez is here for an colonoscopy to be performed for Screening colonoscopy average risk   Risks, benefits, limitations, and alternatives regarding  colonoscopy have been reviewed with the  patient.  Questions have been answered.  All parties agreeable.   Wyline MoodKiran Niaya Hickok, Ramirez  04/03/2019, 9:17 AM

## 2019-04-03 NOTE — Anesthesia Postprocedure Evaluation (Signed)
Anesthesia Post Note  Patient: Joshua Ramirez  Procedure(s) Performed: COLONOSCOPY WITH PROPOFOL (N/A )  Patient location during evaluation: Endoscopy Anesthesia Type: General Level of consciousness: awake and alert Pain management: pain level controlled Vital Signs Assessment: post-procedure vital signs reviewed and stable Respiratory status: spontaneous breathing and respiratory function stable Cardiovascular status: stable Anesthetic complications: no     Last Vitals:  Vitals:   04/03/19 0949 04/03/19 0959  BP: 118/70 118/74  Pulse: 92 88  Resp: 20 (!) 22  Temp: 36.7 C   SpO2: 97% 96%    Last Pain:  Vitals:   04/03/19 0959  TempSrc:   PainSc: 0-No pain                 KEPHART,WILLIAM K

## 2019-04-03 NOTE — Op Note (Signed)
[facility] Gastroenterology Patient Name: Joshua Ramirez Procedure Date: 04/03/2019 9:17 AM MRN: [id number] Account #: [id number] Date of Birth: [birth year] Admit Type: Outpatient Age: 43 Room: [facility] ENDO ROOM 4 Gender: Male Note Status: Finalized Procedure:             Colonoscopy Indications:           Screening for colorectal malignant neoplasm Providers:             [name] MD, MD Medicines:             Monitored Anesthesia Care Complications:         No immediate complications. Procedure:             Pre-Anesthesia Assessment:                        - Prior to the procedure, a History and Physical was                         performed, and patient medications, allergies and                         sensitivities were reviewed. The patient's tolerance                         of previous anesthesia was reviewed.                        - The risks and benefits of the procedure and the                         sedation options and risks were discussed with the                         patient. All questions were answered and informed                         consent was obtained.                        - ASA Grade Assessment: II - A patient with mild                         systemic disease.                        After obtaining informed consent, the colonoscope was                         passed under direct vision. Throughout the procedure,                         the patient's blood pressure, pulse, and oxygen                         saturations were monitored continuously. The                         Colonoscope was introduced through the anus and  advanced to the the cecum, identified by the                         appendiceal orifice. The colonoscopy was performed                         with ease. The patient tolerated the procedure well.                         The quality of the bowel preparation was good. Findings:      The  perianal and digital rectal examinations were normal.      The entire examined colon appeared normal on direct and retroflexion       views. Impression:            - The entire examined colon is normal on direct and                         retroflexion views.                        - No specimens collected. Recommendation:        - Discharge patient to home (with escort).                        - Resume previous diet.                        - Continue present medications.                        - Repeat colonoscopy in 10 years for screening                         purposes. Procedure Code(s):     --- Professional ---                        [phone], Colonoscopy, flexible; diagnostic, including                         collection of specimen(s) by brushing or washing, when                         performed (separate procedure) Diagnosis Code(s):     --- Professional ---                        Z12.11, Encounter for screening for malignant neoplasm                         of colon CPT copyright 2019 American Medical Association. All rights reserved. The codes documented in this report are preliminary and upon coder review may  be revised to meet current compliance requirements. [name], MD [name] MD, MD 04/03/2019 9:46:26 AM This report has been signed electronically. Number of Addenda: 0 Note Initiated On: 04/03/2019 9:17 AM Scope Withdrawal Time: 0 hours 11 minutes 36 seconds  Total Procedure Duration: 0 hours 14 minutes 17 seconds  Estimated Blood Loss:  Estimated blood loss: none.      [facility]

## 2019-04-03 NOTE — Anesthesia Post-op Follow-up Note (Signed)
Anesthesia QCDR form completed.        

## 2019-04-03 NOTE — Anesthesia Preprocedure Evaluation (Signed)
Anesthesia Evaluation  Patient identified by MRN, date of birth, ID band Patient awake    Reviewed: Allergy & Precautions, NPO status , Patient's Chart, lab work & pertinent test results  History of Anesthesia Complications Negative for: history of anesthetic complications  Airway Mallampati: III       Dental   Pulmonary sleep apnea (uses CPAP occassionally) and Continuous Positive Airway Pressure Ventilation , neg COPD, Not current smoker,           Cardiovascular (-) hypertension(-) Past MI and (-) CHF (-) dysrhythmias (-) Valvular Problems/Murmurs     Neuro/Psych neg Seizures Anxiety Depression    GI/Hepatic Neg liver ROS, GERD  Medicated and Controlled,  Endo/Other  neg diabetesMorbid obesity  Renal/GU negative Renal ROS     Musculoskeletal   Abdominal   Peds  Hematology   Anesthesia Other Findings   Reproductive/Obstetrics                             Anesthesia Physical Anesthesia Plan  ASA: III  Anesthesia Plan: General   Post-op Pain Management:    Induction: Intravenous  PONV Risk Score and Plan: 2 and TIVA and Propofol infusion  Airway Management Planned: Nasal Cannula  Additional Equipment:   Intra-op Plan:   Post-operative Plan:   Informed Consent: I have reviewed the patients History and Physical, chart, labs and discussed the procedure including the risks, benefits and alternatives for the proposed anesthesia with the patient or authorized representative who has indicated his/her understanding and acceptance.       Plan Discussed with:   Anesthesia Plan Comments:         Anesthesia Quick Evaluation

## 2019-04-03 NOTE — Transfer of Care (Signed)
Immediate Anesthesia Transfer of Care Note  Patient: Arel Tippen  Procedure(s) Performed: COLONOSCOPY WITH PROPOFOL (N/A )  Patient Location: PACU  Anesthesia Type:General  Level of Consciousness: sedated  Airway & Oxygen Therapy: Patient Spontanous Breathing and Patient connected to nasal cannula oxygen  Post-op Assessment: Report given to RN and Post -op Vital signs reviewed and stable  Post vital signs: Reviewed and stable  Last Vitals:  Vitals Value Taken Time  BP 118/78 04/03/19 0950  Temp    Pulse 92 04/03/19 0950  Resp 21 04/03/19 0950  SpO2 98 % 04/03/19 0950  Vitals shown include unvalidated device data.  Last Pain:  Vitals:   04/03/19 0818  TempSrc: Temporal  PainSc: 0-No pain         Complications: No apparent anesthesia complications

## 2019-04-04 ENCOUNTER — Encounter: Payer: Self-pay | Admitting: Gastroenterology

## 2019-04-25 ENCOUNTER — Encounter: Payer: Self-pay | Admitting: Internal Medicine

## 2019-04-25 NOTE — Telephone Encounter (Signed)
Called and spoke to pt.  Pt said that he thinks he may have pulled a muscle.  Pt said that he sneezed and it was extremely painful.  Recommended pt to go to urgent care per Dr. Aundra Dubin.  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  Asked pt to f/u tomorrow with an update after he goes to Urgent Care today.

## 2019-05-24 ENCOUNTER — Other Ambulatory Visit: Payer: Self-pay | Admitting: Internal Medicine

## 2019-05-24 ENCOUNTER — Encounter: Payer: Self-pay | Admitting: Internal Medicine

## 2019-05-24 DIAGNOSIS — Z20822 Contact with and (suspected) exposure to covid-19: Secondary | ICD-10-CM

## 2019-05-25 ENCOUNTER — Ambulatory Visit: Payer: BC Managed Care – PPO | Attending: Internal Medicine

## 2019-05-25 DIAGNOSIS — Z20822 Contact with and (suspected) exposure to covid-19: Secondary | ICD-10-CM

## 2019-05-31 ENCOUNTER — Ambulatory Visit: Payer: BC Managed Care – PPO | Attending: Internal Medicine

## 2019-05-31 DIAGNOSIS — Z20822 Contact with and (suspected) exposure to covid-19: Secondary | ICD-10-CM

## 2019-05-31 LAB — NOVEL CORONAVIRUS, NAA

## 2019-06-02 LAB — NOVEL CORONAVIRUS, NAA: SARS-CoV-2, NAA: NOT DETECTED

## 2019-06-06 ENCOUNTER — Other Ambulatory Visit: Payer: Self-pay | Admitting: Orthopedic Surgery

## 2019-06-06 DIAGNOSIS — G8929 Other chronic pain: Secondary | ICD-10-CM

## 2019-06-06 DIAGNOSIS — M25561 Pain in right knee: Secondary | ICD-10-CM

## 2019-06-12 ENCOUNTER — Ambulatory Visit
Admission: RE | Admit: 2019-06-12 | Discharge: 2019-06-12 | Disposition: A | Discharge status: Home / Self Care | Payer: BC Managed Care – PPO | Source: Ambulatory Visit | Attending: Orthopedic Surgery | Admitting: Orthopedic Surgery

## 2019-06-12 ENCOUNTER — Other Ambulatory Visit: Payer: Self-pay

## 2019-06-12 DIAGNOSIS — M25561 Pain in right knee: Secondary | ICD-10-CM | POA: Diagnosis not present

## 2019-06-12 DIAGNOSIS — G8929 Other chronic pain: Secondary | ICD-10-CM | POA: Diagnosis present

## 2019-06-21 ENCOUNTER — Other Ambulatory Visit: Payer: Self-pay

## 2019-06-27 ENCOUNTER — Other Ambulatory Visit (INDEPENDENT_AMBULATORY_CARE_PROVIDER_SITE_OTHER): Payer: BC Managed Care – PPO

## 2019-06-27 ENCOUNTER — Other Ambulatory Visit: Payer: Self-pay

## 2019-06-27 DIAGNOSIS — F329 Major depressive disorder, single episode, unspecified: Secondary | ICD-10-CM | POA: Diagnosis not present

## 2019-06-27 DIAGNOSIS — Z0000 Encounter for general adult medical examination without abnormal findings: Secondary | ICD-10-CM | POA: Diagnosis not present

## 2019-06-27 DIAGNOSIS — Z1389 Encounter for screening for other disorder: Secondary | ICD-10-CM

## 2019-06-27 DIAGNOSIS — E785 Hyperlipidemia, unspecified: Secondary | ICD-10-CM

## 2019-06-27 DIAGNOSIS — E559 Vitamin D deficiency, unspecified: Secondary | ICD-10-CM | POA: Diagnosis not present

## 2019-06-27 DIAGNOSIS — Z1159 Encounter for screening for other viral diseases: Secondary | ICD-10-CM

## 2019-06-27 DIAGNOSIS — Z1329 Encounter for screening for other suspected endocrine disorder: Secondary | ICD-10-CM

## 2019-06-27 DIAGNOSIS — Z0184 Encounter for antibody response examination: Secondary | ICD-10-CM

## 2019-06-27 DIAGNOSIS — Z125 Encounter for screening for malignant neoplasm of prostate: Secondary | ICD-10-CM | POA: Diagnosis not present

## 2019-06-27 DIAGNOSIS — F32A Depression, unspecified: Secondary | ICD-10-CM

## 2019-06-27 DIAGNOSIS — F419 Anxiety disorder, unspecified: Secondary | ICD-10-CM

## 2019-06-27 LAB — LIPID PANEL
Cholesterol: 193 mg/dL (ref 0–200)
HDL: 41.1 mg/dL (ref >39.00)
NonHDL: 151.96
Total CHOL/HDL Ratio: 5
Triglycerides: 202 mg/dL — ABNORMAL HIGH (ref 0.0–149.0)
VLDL: 40.4 mg/dL — ABNORMAL HIGH (ref 0.0–40.0)

## 2019-06-27 LAB — COMPREHENSIVE METABOLIC PANEL
ALT: 18 U/L (ref 0–53)
AST: 15 U/L (ref 0–37)
Albumin: 3.9 g/dL (ref 3.5–5.2)
Alkaline Phosphatase: 62 U/L (ref 39–117)
BUN: 19 mg/dL (ref 6–23)
CO2: 28 mEq/L (ref 19–32)
Calcium: 9 mg/dL (ref 8.4–10.5)
Chloride: 103 mEq/L (ref 96–112)
Creatinine, Ser: 1.45 mg/dL (ref 0.40–1.50)
GFR: 63.6 mL/min (ref >60.00)
Glucose, Bld: 88 mg/dL (ref 70–99)
Potassium: 5 mEq/L (ref 3.5–5.1)
Sodium: 138 mEq/L (ref 135–145)
Total Bilirubin: 0.6 mg/dL (ref 0.2–1.2)
Total Protein: 6.7 g/dL (ref 6.0–8.3)

## 2019-06-27 LAB — CBC WITH DIFFERENTIAL/PLATELET
Basophils Absolute: 0 10*3/uL (ref 0.0–0.1)
Basophils Relative: 0.6 % (ref 0.0–3.0)
Eosinophils Absolute: 0.5 10*3/uL (ref 0.0–0.7)
Eosinophils Relative: 7.9 % — ABNORMAL HIGH (ref 0.0–5.0)
HCT: 42.2 % (ref 39.0–52.0)
Hemoglobin: 13.8 g/dL (ref 13.0–17.0)
Lymphocytes Relative: 32.1 % (ref 12.0–46.0)
Lymphs Abs: 2 10*3/uL (ref 0.7–4.0)
MCHC: 32.7 g/dL (ref 30.0–36.0)
MCV: 86 fl (ref 78.0–100.0)
Monocytes Absolute: 0.9 10*3/uL (ref 0.1–1.0)
Monocytes Relative: 14.1 % — ABNORMAL HIGH (ref 3.0–12.0)
Neutro Abs: 2.8 10*3/uL (ref 1.4–7.7)
Neutrophils Relative %: 45.3 % (ref 43.0–77.0)
Platelets: 209 10*3/uL (ref 150.0–400.0)
RBC: 4.91 Mil/uL (ref 4.22–5.81)
RDW: 14.9 % (ref 11.5–15.5)
WBC: 6.2 10*3/uL (ref 4.0–10.5)

## 2019-06-27 LAB — PSA: PSA: 0.59 ng/mL (ref 0.10–4.00)

## 2019-06-27 LAB — LDL CHOLESTEROL, DIRECT: Direct LDL: 118 mg/dL

## 2019-06-27 LAB — VITAMIN D 25 HYDROXY (VIT D DEFICIENCY, FRACTURES): VITD: 37.21 ng/mL (ref 30.00–100.00)

## 2019-06-27 LAB — TSH: TSH: 2.27 u[IU]/mL (ref 0.35–4.50)

## 2019-06-28 LAB — URINALYSIS, ROUTINE W REFLEX MICROSCOPIC
Bilirubin Urine: NEGATIVE
Glucose, UA: NEGATIVE
Hgb urine dipstick: NEGATIVE
Ketones, ur: NEGATIVE
Leukocytes,Ua: NEGATIVE
Nitrite: NEGATIVE
Protein, ur: NEGATIVE
Specific Gravity, Urine: 1.014 (ref 1.001–1.03)
pH: 7.5 (ref 5.0–8.0)

## 2019-06-28 LAB — HEPATITIS B SURFACE ANTIBODY, QUANTITATIVE: Hep B S AB Quant (Post): <5 m[IU]/mL — ABNORMAL LOW (ref >=10)

## 2019-07-06 ENCOUNTER — Other Ambulatory Visit: Payer: Self-pay

## 2019-07-06 ENCOUNTER — Ambulatory Visit
Admission: RE | Admit: 2019-07-06 | Discharge: 2019-07-06 | Disposition: A | Discharge status: Home / Self Care | Payer: BC Managed Care – PPO | Source: Ambulatory Visit | Attending: Internal Medicine | Admitting: Internal Medicine

## 2019-07-06 ENCOUNTER — Encounter: Payer: Self-pay | Admitting: Internal Medicine

## 2019-07-06 ENCOUNTER — Ambulatory Visit (INDEPENDENT_AMBULATORY_CARE_PROVIDER_SITE_OTHER): Payer: BC Managed Care – PPO | Admitting: Internal Medicine

## 2019-07-06 VITALS — Ht 72.0 in | Wt 260.0 lb

## 2019-07-06 DIAGNOSIS — M79604 Pain in right leg: Secondary | ICD-10-CM | POA: Insufficient documentation

## 2019-07-06 DIAGNOSIS — M7989 Other specified soft tissue disorders: Secondary | ICD-10-CM | POA: Insufficient documentation

## 2019-07-06 DIAGNOSIS — F419 Anxiety disorder, unspecified: Secondary | ICD-10-CM

## 2019-07-06 DIAGNOSIS — F329 Major depressive disorder, single episode, unspecified: Secondary | ICD-10-CM | POA: Diagnosis not present

## 2019-07-06 NOTE — Progress Notes (Signed)
Telephone Note  I connected with Joshua Ramirez  on 07/06/19 at  8:45 AM EST by telephone and verified that I am speaking with the correct person using two identifiers.  Location patient: work Environmental manager or home office Persons participating in the virtual visit: patient, provider  I discussed the limitations of evaluation and management by telemedicine and the availability of in person appointments. The patient expressed understanding and agreed to proceed.   HPI: 1. Right leg pain and swelling around calf and posterior knee sore and hard and work abnormal MRI 06/12/19 leaking baking cyst 10/10 with flexion and 5/10 now was walking on heels to help with pain  06/12/19  IMPRESSION: 1. Attenuation of the body and free edge of the posterior horn of the medial meniscus consistent with interval meniscectomy. No recurrent meniscal tear. 2. Severe thickening of proximal MCL as can be seen with severe subacute MCL strain versus postsurgical changes. 3. Severe edema in Hoffa's fat focally just anterior to the ACL insertion as can be seen with impingement. 4. Small leaking Baker's cyst.  2. Anxiety/depression meds doing well  wellbutrin 150 mg qd and prn xanax   ROS: See pertinent positives and negatives per HPI.  Past Medical History:  Diagnosis Date  . Arthritis    knee s/p right knee surgery meniscal tear with bone fragments, ankle (ortho Dr. Rudene Christians)  . GERD (gastroesophageal reflux disease)   . H. pylori infection   . Herpes infection   . OSA on CPAP   . Sleep apnea 06/2017   PT JUST WAS DX WITH SLEEP APNEA LAST WEEK AND IS WAITING ON HIS CPAP MACHINE    Past Surgical History:  Procedure Laterality Date  . COLONOSCOPY WITH PROPOFOL N/A 04/03/2019   Procedure: COLONOSCOPY WITH PROPOFOL;  Surgeon: [name], MD;  Location: [facility] ENDOSCOPY;  Service: Gastroenterology;  Laterality: N/A;  . CYST EXCISION  12/12/2018   Procedure: CYST REMOVAL;  Surgeon: Leim Fabry, MD;   Location: [facility] ORS;  Service: Orthopedics;;  . ESOPHAGOGASTRODUODENOSCOPY (EGD) WITH PROPOFOL N/A 01/26/2018   Procedure: ESOPHAGOGASTRODUODENOSCOPY (EGD) WITH PROPOFOL;  Surgeon: [name], MD;  Location: [facility] ENDOSCOPY;  Service: Gastroenterology;  Laterality: N/A;  . KIDNEY DONATION Left    2004  . KNEE ARTHROSCOPY WITH MEDIAL MENISECTOMY Right 07/15/2017   Procedure: KNEE ARTHROSCOPY WITH MEDIAL MENISECTOMY;  Surgeon: Hessie Knows, MD;  Location: [facility] ORS;  Service: Orthopedics;  Laterality: Right;  . KNEE ARTHROSCOPY WITH MEDIAL MENISECTOMY Right 12/12/2018   Procedure: KNEE ARTHROSCOPY WITH PARTIAL MEDIAL MENISECTOMY;  Surgeon: Leim Fabry, MD;  Location: [facility] ORS;  Service: Orthopedics;  Laterality: Right;  . meniscal tear     right knee Dr. Rudene Christians 2014   . MENISECTOMY Right 07/15/2017   Procedure: MENISECTOMY;  Surgeon: Hessie Knows, MD;  Location: [facility] ORS;  Service: Orthopedics;  Laterality: Right;  . ROBOTIC ASSISTED LAPAROSCOPIC NISSEN FUNDOPLICATION N/A 15/40/0867   Procedure: ROBOTIC ASSISTED LAPAROSCOPIC NISSEN FUNDOPLICATION WITH HIATAL HERNIA REPAIR;  Surgeon: Jules Husbands, MD;  Location: [facility] ORS;  Service: General;  Laterality: N/A;  . UMBILICAL HERNIA REPAIR N/A 03/03/2018   Procedure: HERNIA REPAIR UMBILICAL ADULT;  Surgeon: Jules Husbands, MD;  Location: [facility] ORS;  Service: General;  Laterality: N/A;    Family History  Problem Relation Age of Onset  . Drug abuse Mother   . Hyperlipidemia Mother   . Hypertension Mother   . Cancer Maternal Aunt        brain, breast, lung ? primary was smoker   .  Prostate cancer Neg Hx   . Bladder Cancer Neg Hx   . 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  acetaminophen (TYLENOL) 500 MG tablet, Take 2 tablets (1,000 mg total) by mouth every 8 (eight)  hours., Disp: 90 tablet, Rfl: 2 .  ALPRAZolam (XANAX) 0.25 MG tablet, Take 1 tablet (0.25 mg total) by mouth at bedtime as needed for anxiety., Disp: 30 tablet, Rfl: 2 .  buPROPion (WELLBUTRIN XL) 150 MG 24 hr tablet, Take 1 tablet (150 mg total) by mouth daily., Disp: 90 tablet, Rfl: 3 .  Olopatadine HCl 0.2 % SOLN, Place 1 drop into both eyes daily as needed (runny eyes)., Disp: 1 Bottle, Rfl: 11 .  tadalafil (CIALIS) 5 MG tablet, Take 1 tablet (5 mg total) by mouth daily as needed for erectile dysfunction., Disp: 30 tablet, Rfl: 11 .  tadalafil (CIALIS) 5 MG tablet, Take 1 tablet (5 mg total) by mouth daily as needed for erectile dysfunction., Disp: 90 tablet, Rfl: 3 .  valACYclovir (VALTREX) 500 MG tablet, Take 1 tablet (500 mg total) by mouth daily. For prevention. With outbreak take w/in 1 day of lesion 500 mg bid x 3-7 days (Patient taking differently: Take 500 mg by mouth See admin instructions. Take 500 mg daily for prevention. With outbreak take 500 mg twice daily for 3 to 7 days), Disp: 120 tablet, Rfl: 3  EXAM:  VITALS per patient if applicable:  GENERAL: alert, oriented, appears well and in no acute distress  PSYCH/NEURO: pleasant and cooperative, no obvious depression or anxiety, speech and thought processing grossly intact  ASSESSMENT AND PLAN:  Discussed the following assessment and plan:  Right leg swelling - Plan: US Venous Img Lower Unilateral Right  Right leg pain - Plan: US Venous Img Lower Unilateral Right Abnormal MRI right knee disc likely needs to f/u with Dr. Posey Pronto   Anxiety and depression meds helping doing well  Anxiety and depression - Plan: ALPRAZolam (XANAX) 0.25 MG tablet, buPROPion (WELLBUTRIN XL) 150 MG 24 hr tablet  HM Flu shot declined prev UTD Tdap MMR immune hep B had 3/3 vaccines consider new hep b vx x 2doses pt will call to schedule   Std utd  Colonoscopy 03/2019 neg rec healthy diet and exercise  Disc the next 56 days  PSA nl  06/27/19 -DRE in future  D3 5000 IU and mvt rec.    -we discussed possible serious and likely etiologies, options for evaluation and workup, limitations of telemedicine visit vs in person visit, treatment, treatment risks and precautions. Pt prefers to treat via telemedicine empirically rather then risking or undertaking an in person visit at this moment. Patient agrees to seek prompt in person care if worsening, new symptoms arise, or if is not improving with treatment.   I discussed the assessment and treatment plan with the patient. The patient was provided an opportunity to ask questions and all were answered. The patient agreed with the plan and demonstrated an understanding of the instructions.   The patient was advised to call back or seek an in-person evaluation if the symptoms worsen or if the condition fails to improve as anticipated.  Time spent  20 minutes  Delorise Jackson, MD

## 2019-07-07 ENCOUNTER — Encounter: Payer: Self-pay | Admitting: Internal Medicine

## 2019-07-07 ENCOUNTER — Telehealth: Payer: Self-pay | Admitting: Internal Medicine

## 2019-07-07 NOTE — Telephone Encounter (Signed)
Pt would like a call back about his ultrasound he had yesterday.

## 2019-07-07 NOTE — Telephone Encounter (Signed)
Patient was informed of results.  Patient understood and no questions, comments, or concerns at this time.  

## 2019-07-13 ENCOUNTER — Ambulatory Visit: Payer: BC Managed Care – PPO

## 2019-07-30 ENCOUNTER — Ambulatory Visit: Payer: BC Managed Care – PPO | Attending: Internal Medicine

## 2019-07-30 DIAGNOSIS — Z23 Encounter for immunization: Secondary | ICD-10-CM

## 2019-07-30 NOTE — Progress Notes (Signed)
   Covid-19 Vaccination Clinic  Name:  Aquilla Voiles    MRN: [id number] DOB: October 29, 1975  07/30/2019  Mr. Doig was observed post Covid-19 immunization for 15 minutes without incident. He was provided with Vaccine Information Sheet and instruction to access the V-Safe system.   Mr. Navarra was instructed to call 911 with any severe reactions post vaccine: Marland Kitchen Difficulty breathing  . Swelling of face and throat  . A fast heartbeat  . A bad rash all over body  . Dizziness and weakness   Immunizations Administered    Name Date Dose VIS Date Route   Pfizer COVID-19 Vaccine 07/30/2019  9:26 AM 0.3 mL 05/05/2019 Intramuscular   Manufacturer: [employer], [employer]   Lot: [id number]   NDC: [address]-4

## 2019-08-01 ENCOUNTER — Ambulatory Visit: Payer: BC Managed Care – PPO

## 2019-08-21 ENCOUNTER — Ambulatory Visit: Payer: BC Managed Care – PPO | Attending: Internal Medicine

## 2019-08-21 DIAGNOSIS — Z23 Encounter for immunization: Secondary | ICD-10-CM

## 2019-08-21 NOTE — Progress Notes (Signed)
   Covid-19 Vaccination Clinic  Name:  Lesslie Mckeehan    MRN: [id number] DOB: Oct 23, 1975  08/21/2019  Mr. Gilreath was observed post Covid-19 immunization for 15 minutes without incident. He was provided with Vaccine Information Sheet and instruction to access the V-Safe system.   Mr. Brander was instructed to call 911 with any severe reactions post vaccine: Marland Kitchen Difficulty breathing  . Swelling of face and throat  . A fast heartbeat  . A bad rash all over body  . Dizziness and weakness   Immunizations Administered    Name Date Dose VIS Date Route   Pfizer COVID-19 Vaccine 08/21/2019  8:33 AM 0.3 mL 05/05/2019 Intramuscular   Manufacturer: [employer], [employer]   Lot: [id number]   NDC: [address]-8

## 2019-09-15 ENCOUNTER — Other Ambulatory Visit: Payer: Self-pay | Admitting: Internal Medicine

## 2019-09-15 DIAGNOSIS — B009 Herpesviral infection, unspecified: Secondary | ICD-10-CM

## 2019-09-15 MED ORDER — VALACYCLOVIR HCL 500 MG PO TABS: 500.0000 mg | ORAL_TABLET | ORAL | 3 refills | Status: DC

## 2019-11-07 ENCOUNTER — Ambulatory Visit: Payer: BC Managed Care – PPO | Admitting: Internal Medicine

## 2019-11-07 ENCOUNTER — Telehealth: Payer: Self-pay | Admitting: Internal Medicine

## 2019-11-07 NOTE — Telephone Encounter (Signed)
Patient no-showed today's appointment; appointment was for 6/15 at 8:00 am, provider notified for review of record. Mychart message sent to reschedule.

## 2019-11-08 ENCOUNTER — Ambulatory Visit: Payer: BC Managed Care – PPO | Admitting: Internal Medicine

## 2019-11-10 ENCOUNTER — Encounter: Payer: Self-pay | Admitting: Internal Medicine

## 2019-11-10 ENCOUNTER — Ambulatory Visit (INDEPENDENT_AMBULATORY_CARE_PROVIDER_SITE_OTHER): Payer: BC Managed Care – PPO | Admitting: Internal Medicine

## 2019-11-10 ENCOUNTER — Other Ambulatory Visit: Payer: Self-pay

## 2019-11-10 VITALS — BP 118/84 | HR 76 | Temp 97.5°F | Ht 72.0 in | Wt 265.0 lb

## 2019-11-10 DIAGNOSIS — Z0184 Encounter for antibody response examination: Secondary | ICD-10-CM

## 2019-11-10 DIAGNOSIS — F419 Anxiety disorder, unspecified: Secondary | ICD-10-CM

## 2019-11-10 DIAGNOSIS — E559 Vitamin D deficiency, unspecified: Secondary | ICD-10-CM

## 2019-11-10 DIAGNOSIS — Z1389 Encounter for screening for other disorder: Secondary | ICD-10-CM

## 2019-11-10 DIAGNOSIS — Z1159 Encounter for screening for other viral diseases: Secondary | ICD-10-CM

## 2019-11-10 DIAGNOSIS — F339 Major depressive disorder, recurrent, unspecified: Secondary | ICD-10-CM | POA: Diagnosis not present

## 2019-11-10 DIAGNOSIS — Z23 Encounter for immunization: Secondary | ICD-10-CM

## 2019-11-10 DIAGNOSIS — E669 Obesity, unspecified: Secondary | ICD-10-CM | POA: Diagnosis not present

## 2019-11-10 DIAGNOSIS — F329 Major depressive disorder, single episode, unspecified: Secondary | ICD-10-CM

## 2019-11-10 DIAGNOSIS — Z125 Encounter for screening for malignant neoplasm of prostate: Secondary | ICD-10-CM

## 2019-11-10 DIAGNOSIS — Z1329 Encounter for screening for other suspected endocrine disorder: Secondary | ICD-10-CM | POA: Diagnosis not present

## 2019-11-10 DIAGNOSIS — Z0000 Encounter for general adult medical examination without abnormal findings: Secondary | ICD-10-CM

## 2019-11-10 DIAGNOSIS — R7989 Other specified abnormal findings of blood chemistry: Secondary | ICD-10-CM

## 2019-11-10 MED ORDER — BUPROPION HCL ER (XL) 300 MG PO TB24: 300.0000 mg | ORAL_TABLET | Freq: Every day | ORAL | 3 refills | Status: DC

## 2019-11-10 MED ORDER — ALPRAZOLAM 0.25 MG PO TABS: 0.2500 mg | ORAL_TABLET | Freq: Every evening | ORAL | 2 refills | Status: DC | PRN

## 2019-11-10 NOTE — Patient Instructions (Addendum)
[facility]  [phone]  Referral   Scopolamine patch --let me know for motion sickness

## 2019-11-10 NOTE — Progress Notes (Signed)
Chief Complaint  Patient presents with  . Follow-up   F/u  1. Recurrent depression and anxiety on xanax 0.25 prn and wellbutrin 150 mg xl gad 7 18 and PHQ 14  2. Agreeable new hep B vaccine today and 1 month  Review of Systems  Constitutional: Negative for weight loss.  HENT: Negative for hearing loss.   Eyes: Negative for blurred vision.  Respiratory: Negative for shortness of breath.   Cardiovascular: Negative for chest pain.  Gastrointestinal: Negative for abdominal pain.  Musculoskeletal: Positive for joint pain. Negative for falls.  Skin: Negative for rash.  Neurological: Negative for headaches.  Psychiatric/Behavioral: Positive for depression. The patient is nervous/anxious.    Past Medical History:  Diagnosis Date  . Arthritis    knee s/p right knee surgery meniscal tear with bone fragments, ankle (ortho Dr. Rudene Christians)  . GERD (gastroesophageal reflux disease)   . H. pylori infection   . Herpes infection   . OSA on CPAP   . Sleep apnea 06/2017   PT JUST WAS DX WITH SLEEP APNEA LAST WEEK AND IS WAITING ON HIS CPAP MACHINE   Past Surgical History:  Procedure Laterality Date  . COLONOSCOPY WITH PROPOFOL N/A 04/03/2019   Procedure: COLONOSCOPY WITH PROPOFOL;  Surgeon: [name], MD;  Location: [facility] ENDOSCOPY;  Service: Gastroenterology;  Laterality: N/A;  . CYST EXCISION  12/12/2018   Procedure: CYST REMOVAL;  Surgeon: Leim Fabry, MD;  Location: [facility] ORS;  Service: Orthopedics;;  . ESOPHAGOGASTRODUODENOSCOPY (EGD) WITH PROPOFOL N/A 01/26/2018   Procedure: ESOPHAGOGASTRODUODENOSCOPY (EGD) WITH PROPOFOL;  Surgeon: [name], MD;  Location: [facility] ENDOSCOPY;  Service: Gastroenterology;  Laterality: N/A;  . KIDNEY DONATION Left    2004  . KNEE ARTHROSCOPY WITH MEDIAL MENISECTOMY Right 07/15/2017   Procedure: KNEE ARTHROSCOPY WITH MEDIAL MENISECTOMY;  Surgeon: Hessie Knows, MD;  Location: [facility] ORS;  Service: Orthopedics;  Laterality: Right;  . KNEE ARTHROSCOPY WITH MEDIAL  MENISECTOMY Right 12/12/2018   Procedure: KNEE ARTHROSCOPY WITH PARTIAL MEDIAL MENISECTOMY;  Surgeon: Leim Fabry, MD;  Location: [facility] ORS;  Service: Orthopedics;  Laterality: Right;  . meniscal tear     right knee Dr. Rudene Christians 2014   . MENISECTOMY Right 07/15/2017   Procedure: MENISECTOMY;  Surgeon: Hessie Knows, MD;  Location: [facility] ORS;  Service: Orthopedics;  Laterality: Right;  . ROBOTIC ASSISTED LAPAROSCOPIC NISSEN FUNDOPLICATION N/A 83/66/2947   Procedure: ROBOTIC ASSISTED LAPAROSCOPIC NISSEN FUNDOPLICATION WITH HIATAL HERNIA REPAIR;  Surgeon: Jules Husbands, MD;  Location: [facility] ORS;  Service: General;  Laterality: N/A;  . UMBILICAL HERNIA REPAIR N/A 03/03/2018   Procedure: HERNIA REPAIR UMBILICAL ADULT;  Surgeon: Jules Husbands, MD;  Location: [facility] ORS;  Service: General;  Laterality: N/A;   Family History  Problem Relation Age of Onset  . Drug abuse Mother   . Hyperlipidemia Mother   . Hypertension Mother   . Cancer Maternal Aunt        brain, breast, lung ? primary was smoker   . Prostate cancer Neg Hx   . Bladder Cancer Neg Hx   . Kidney cancer Neg Hx    Social History   Socioeconomic History  . Marital status: Legally Separated    Spouse name: Not on file  . Number of children: Not on file  . Years of education: Not on file  . Highest education level: Not on file  Occupational History  . Not on file  Tobacco Use  . Smoking status: Never Smoker  . Smokeless tobacco: Never Used  . Tobacco  comment: light in the past social smoker quit  Vaping Use  . Vaping Use: Never used  Substance and Sexual Activity  . Alcohol use: Yes    Alcohol/week: 1.0 - 2.0 standard drink    Types: 1 - 2 Shots of liquor per week  . Drug use: No  . Sexual activity: Yes  Other Topics Concern  . 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 Difficulty of Paying Living Expenses:   Food Insecurity:   . Worried About Charity fundraiser in the Last Year:   . Arboriculturist in the Last Year:   Transportation Needs:   . Film/video editor (Medical):   Marland Kitchen Lack of Transportation (Non-Medical):   Physical Activity:   . Days of Exercise per Week:   . Minutes of Exercise per Session:   Stress:   . Feeling of Stress :   Social Connections:   . Frequency of Communication with Friends and Family:   . Frequency of Social Gatherings with Friends and Family:   . Attends Religious Services:   . Active Member of Clubs or Organizations:   . Attends Archivist Meetings:   Marland Kitchen Marital Status:   Intimate Partner Violence:   . Fear of Current or Ex-Partner:   . Emotionally Abused:   Marland Kitchen Physically Abused:   . Sexually Abused:    Current Meds  Medication Sig  . ALPRAZolam (XANAX) 0.25 MG tablet Take 1 tablet (0.25 mg total) by mouth at bedtime as needed for anxiety.  Marland Kitchen buPROPion (WELLBUTRIN XL) 300 MG 24 hr tablet Take 1 tablet (300 mg total) by mouth daily.  . Olopatadine HCl 0.2 % SOLN Place 1 drop into both eyes daily as needed (runny eyes).  . tadalafil (CIALIS) 5 MG tablet Take 1 tablet (5 mg total) by mouth daily as needed for erectile dysfunction.  . tadalafil (CIALIS) 5 MG tablet Take 1 tablet (5 mg total) by mouth daily as needed for erectile dysfunction.  . valACYclovir (VALTREX) 500 MG tablet Take 1 tablet (500 mg total) by mouth See admin instructions. Take 500 mg daily for prevention. With outbreak take 500 mg twice daily for 3 to 7 days   No Known Allergies No results found for this or any previous visit (from the past 2160 hour(s)). Objective  Body mass index is 35.94 kg/m. Wt Readings from Last 3 Encounters:  11/10/19 265 lb (120.2 kg)  07/06/19 260 lb (117.9 kg)  04/03/19 260 lb (117.9 kg)   Temp Readings from Last 3 Encounters:  11/10/19 (!) 97.5  F (36.4 C) (Temporal)  04/03/19 98 F (36.7 C) (Temporal)  03/17/19 97.8 F (36.6 C) (Oral)   BP Readings from Last 3 Encounters:  11/10/19 118/84  04/03/19 (!) 121/91  03/17/19 128/78   Pulse Readings from Last 3 Encounters:  11/10/19 76  04/03/19 84  03/17/19 90    Physical Exam Vitals and nursing note reviewed.  Constitutional:      Appearance: Normal appearance. He is well-developed and well-groomed. He is obese.  HENT:     Head: Normocephalic and atraumatic.  Eyes:     Conjunctiva/sclera: Conjunctivae normal.     Pupils: Pupils are equal, round, and reactive to light.  Cardiovascular:  Rate and Rhythm: Normal rate and regular rhythm.     Heart sounds: Normal heart sounds.  Skin:    General: Skin is warm and moist.  Neurological:     General: No focal deficit present.     Mental Status: He is alert and oriented to person, place, and time. Mental status is at baseline.     Gait: Gait normal.  Psychiatric:        Attention and Perception: Attention and perception normal.        Mood and Affect: Mood normal. Affect is flat.        Speech: Speech normal.        Behavior: Behavior is cooperative.        Thought Content: Thought content normal.        Cognition and Memory: Cognition and memory normal.        Judgment: Judgment normal.     Assessment  Plan  Depression, recurrent ([place]) - Plan: Ambulatory referral to Psychology  Anxiety and depression - Plan: buPROPion (WELLBUTRIN XL) 300 MG 24 hr tablet, ALPRAZolam (XANAX) 0.25 MG tablet, Ambulatory referral to Psychology  Obesity (BMI 35.0-39.9 without comorbidity) rec healthy diet and exercise   HM Flu shotdeclined prev UTD Tdap MMR immune hep B had 3/3 vaccines consider new hep b vx x 2doses pt will call to schedule  covid 2/2   Stdutd  Colonoscopy 03/2019 neg rec healthy diet and exercise  Disc the next 56 days  PSA nl 06/27/19 -DRE in futureage 48 D3 5000 IU and mvt rec.   Provider: Dr.  Olivia Mackie McLean-Scocuzza-Internal Medicine

## 2019-12-13 ENCOUNTER — Ambulatory Visit: Payer: BC Managed Care – PPO

## 2020-02-08 ENCOUNTER — Ambulatory Visit (INDEPENDENT_AMBULATORY_CARE_PROVIDER_SITE_OTHER): Payer: BC Managed Care – PPO | Admitting: Urology

## 2020-02-08 ENCOUNTER — Encounter: Payer: Self-pay | Admitting: Urology

## 2020-02-08 ENCOUNTER — Ambulatory Visit (INDEPENDENT_AMBULATORY_CARE_PROVIDER_SITE_OTHER): Payer: BC Managed Care – PPO

## 2020-02-08 ENCOUNTER — Other Ambulatory Visit: Payer: Self-pay

## 2020-02-08 VITALS — BP 135/81 | HR 71 | Ht 72.0 in | Wt 267.2 lb

## 2020-02-08 VITALS — Temp 98.2°F

## 2020-02-08 DIAGNOSIS — N529 Male erectile dysfunction, unspecified: Secondary | ICD-10-CM

## 2020-02-08 DIAGNOSIS — N3281 Overactive bladder: Secondary | ICD-10-CM | POA: Diagnosis not present

## 2020-02-08 DIAGNOSIS — Z125 Encounter for screening for malignant neoplasm of prostate: Secondary | ICD-10-CM

## 2020-02-08 DIAGNOSIS — Z23 Encounter for immunization: Secondary | ICD-10-CM

## 2020-02-08 MED ORDER — TADALAFIL 10 MG PO TABS: 10.0000 mg | ORAL_TABLET | Freq: Every day | ORAL | 6 refills | Status: DC | PRN

## 2020-02-08 NOTE — Progress Notes (Signed)
Patient came in today to receive 2nd Hep B Heplisav vaccine in left deltoid IM. Patient tolerated well with no signs of distress.

## 2020-02-08 NOTE — Progress Notes (Signed)
   02/08/2020 2:40 PM   Joshua Ramirez May 29, 1975 [id number]  Reason for visit: Follow up ED, urinary symptoms, PSA screening  HPI: I saw [name] in urology clinic today for follow-up of the above issues.  He is a 44 year old male who is currently on Cialis every other day for erectile dysfunction with good results.  He does notice that sometimes he needs an extra boost and takes 10 mg.  He is interested in increasing the dose to 10 mg every other day to see if this improves his erection quality.    He denies any family history of prostate cancer.  Most recent PSA was normal at 0.59 in February 2021.  We reviewed the AUA guidelines regarding PSA screening.  He does endorse some urinary symptoms of frequency and weak stream, however he drinks primarily diet soda during the day.  He denies any feeling of incomplete emptying, gross hematuria, UTIs, or history of retention.  We discussed the relationship between bladder irritants like coffee, tea, sodas, diet drinks, and alcohol with urinary symptoms, and I encouraged him to cut back on his diet sodas to see if this improves his urinary symptoms.  Cialis increased to 10 mg every other day, risk and benefits discussed Behavioral strategies discussed regarding urinary symptoms Continue PSA screening with PCP RTC 1 year symptom check   Sondra Come, MD  [facility] [address], [address] [place], [place] [address] [phone]

## 2020-02-08 NOTE — Patient Instructions (Signed)
Tadalafil tablets (Cialis) What is this medicine? TADALAFIL (tah DA la fil) is used to treat erection problems in men. It is also used for enlargement of the prostate gland in men, a condition called benign prostatic hyperplasia or BPH. This medicine improves urine flow and reduces BPH symptoms. This medicine can also treat both erection problems and BPH when they occur together. This medicine may be used for other purposes; ask your health care provider or pharmacist if you have questions. COMMON BRAND NAME(S): Adcirca, ALYQ, Cialis What should I tell my health care provider before I take this medicine? They need to know if you have any of these conditions:  bleeding disorders  eye or vision problems, including a rare inherited eye disease called retinitis pigmentosa  anatomical deformation of the penis, Peyronie's disease, or history of priapism (painful and prolonged erection)  heart disease, angina, a history of heart attack, irregular heart beats, or other heart problems  high or low blood pressure  history of blood diseases, like sickle cell anemia or leukemia  history of stomach bleeding  kidney disease  liver disease  stroke  an unusual or allergic reaction to tadalafil, other medicines, foods, dyes, or preservatives  pregnant or trying to get pregnant  breast-feeding How should I use this medicine? Take this medicine by mouth with a glass of water. Follow the directions on the prescription label. You may take this medicine with or without meals. When this medicine is used for erection problems, your doctor may prescribe it to be taken once daily or as needed. If you are taking the medicine as needed, you may be able to have sexual activity 30 minutes after taking it and for up to 36 hours after taking it. Whether you are taking the medicine as needed or once daily, you should not take more than one dose per day. If you are taking this medicine for symptoms of benign  prostatic hyperplasia (BPH) or to treat both BPH and an erection problem, take the dose once daily at about the same time each day. Do not take your medicine more often than directed. Talk to your pediatrician regarding the use of this medicine in children. Special care may be needed. Overdosage: If you think you have taken too much of this medicine contact a poison control center or emergency room at once. NOTE: This medicine is only for you. Do not share this medicine with others. What if I miss a dose? If you are taking this medicine as needed for erection problems, this does not apply. If you miss a dose while taking this medicine once daily for an erection problem, benign prostatic hyperplasia, or both, take it as soon as you remember, but do not take more than one dose per day. What may interact with this medicine? Do not take this medicine with any of the following medications:  nitrates like amyl nitrite, isosorbide dinitrate, isosorbide mononitrate, nitroglycerin  other medicines for erectile dysfunction like avanafil, sildenafil, vardenafil  other tadalafil products (Adcirca)  riociguat This medicine may also interact with the following medications:  certain drugs for high blood pressure  certain drugs for the treatment of HIV infection or AIDS  certain drugs used for fungal or yeast infections, like fluconazole, itraconazole, ketoconazole, and voriconazole  certain drugs used for seizures like carbamazepine, phenytoin, and phenobarbital  grapefruit juice  macrolide antibiotics like clarithromycin, erythromycin, troleandomycin  medicines for prostate problems  rifabutin, rifampin or rifapentine This list may not describe all possible interactions. Give your   health care provider a list of all the medicines, herbs, non-prescription drugs, or dietary supplements you use. Also tell them if you smoke, drink alcohol, or use illegal drugs. Some items may interact with your  medicine. What should I watch for while using this medicine? If you notice any changes in your vision while taking this drug, call your doctor or health care professional as soon as possible. Stop using this medicine and call your health care provider right away if you have a loss of sight in one or both eyes. Contact your doctor or health care professional right away if the erection lasts longer than 4 hours or if it becomes painful. This may be a sign of serious problem and must be treated right away to prevent permanent damage. If you experience symptoms of nausea, dizziness, chest pain or arm pain upon initiation of sexual activity after taking this medicine, you should refrain from further activity and call your doctor or health care professional as soon as possible. Do not drink alcohol to excess (examples, 5 glasses of wine or 5 shots of whiskey) when taking this medicine. When taken in excess, alcohol can increase your chances of getting a headache or getting dizzy, increasing your heart rate or lowering your blood pressure. Using this medicine does not protect you or your partner against HIV infection (the virus that causes AIDS) or other sexually transmitted diseases. What side effects may I notice from receiving this medicine? Side effects that you should report to your doctor or health care professional as soon as possible:  allergic reactions like skin rash, itching or hives, swelling of the face, lips, or tongue  breathing problems  changes in hearing  changes in vision  chest pain  fast, irregular heartbeat  prolonged or painful erection  seizures Side effects that usually do not require medical attention (report to your doctor or health care professional if they continue or are bothersome):  back pain  dizziness  flushing  headache  indigestion  muscle aches  nausea  stuffy or runny nose This list may not describe all possible side effects. Call your doctor  for medical advice about side effects. You may report side effects to FDA at 1-800-FDA-1088. Where should I keep my medicine? Keep out of the reach of children. Store at room temperature between 15 and 30 degrees C (59 and 86 degrees F). Throw away any unused medicine after the expiration date. NOTE: This sheet is a summary. It may not cover all possible information. If you have questions about this medicine, talk to your doctor, pharmacist, or health care provider.  2020 Elsevier/Gold Standard (2013-09-29 13:15:49)  

## 2020-02-22 ENCOUNTER — Encounter: Payer: Self-pay | Admitting: Internal Medicine

## 2020-02-29 ENCOUNTER — Other Ambulatory Visit: Payer: Self-pay

## 2020-02-29 ENCOUNTER — Encounter: Payer: Self-pay | Admitting: Internal Medicine

## 2020-02-29 ENCOUNTER — Ambulatory Visit: Payer: BLUE CROSS/BLUE SHIELD | Admitting: Internal Medicine

## 2020-02-29 VITALS — BP 128/78 | HR 97 | Temp 98.2°F | Ht 72.0 in | Wt 267.0 lb

## 2020-02-29 DIAGNOSIS — F419 Anxiety disorder, unspecified: Secondary | ICD-10-CM | POA: Diagnosis not present

## 2020-02-29 DIAGNOSIS — F32A Depression, unspecified: Secondary | ICD-10-CM

## 2020-02-29 DIAGNOSIS — Z111 Encounter for screening for respiratory tuberculosis: Secondary | ICD-10-CM

## 2020-02-29 DIAGNOSIS — Z0000 Encounter for general adult medical examination without abnormal findings: Secondary | ICD-10-CM

## 2020-02-29 DIAGNOSIS — Z23 Encounter for immunization: Secondary | ICD-10-CM | POA: Diagnosis not present

## 2020-02-29 MED ORDER — ALPRAZOLAM 0.25 MG PO TABS: 0.2500 mg | ORAL_TABLET | Freq: Every evening | ORAL | 5 refills | Status: DC | PRN

## 2020-02-29 NOTE — Progress Notes (Signed)
Chief Complaint  Patient presents with  . Follow-up  . Form Completion   Annual 1. 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 Anxiety at times on xanax 0.25 controlled needs refill anxiety due to 46 yo GF sick who is like his dad and he lives 4 hrs away in [place] 3. Flu shot today  Review of Systems  Constitutional: Negative for weight loss.  HENT: Negative for hearing loss.   Eyes: Negative for blurred vision.  Respiratory: Negative for shortness of breath.   Cardiovascular: Negative for chest pain.  Gastrointestinal: Negative for abdominal pain.  Musculoskeletal: Negative for falls.  Skin: Negative for rash.  Neurological: Negative for headaches.  Psychiatric/Behavioral: The patient is nervous/anxious.    Past Medical History:  Diagnosis Date  . Arthritis    knee s/p right knee surgery meniscal tear with bone fragments, ankle (ortho Dr. Rudene Christians)  . GERD (gastroesophageal reflux disease)   . H. pylori infection   . Herpes infection   . OSA on CPAP   . Sleep apnea 06/2017   PT JUST WAS DX WITH SLEEP APNEA LAST WEEK AND IS WAITING ON HIS CPAP MACHINE   Past Surgical History:  Procedure Laterality Date  . COLONOSCOPY WITH PROPOFOL N/A 04/03/2019   Procedure: COLONOSCOPY WITH PROPOFOL;  Surgeon: [name], MD;  Location: [facility] ENDOSCOPY;  Service: Gastroenterology;  Laterality: N/A;  . CYST EXCISION  12/12/2018   Procedure: CYST REMOVAL;  Surgeon: Leim Fabry, MD;  Location: [facility] ORS;  Service: Orthopedics;;  . ESOPHAGOGASTRODUODENOSCOPY (EGD) WITH PROPOFOL N/A 01/26/2018   Procedure: ESOPHAGOGASTRODUODENOSCOPY (EGD) WITH PROPOFOL;  Surgeon: [name], MD;  Location: [facility] ENDOSCOPY;  Service: Gastroenterology;  Laterality: N/A;  . KIDNEY DONATION Left    2004  . KNEE ARTHROSCOPY WITH MEDIAL MENISECTOMY Right 07/15/2017   Procedure: KNEE ARTHROSCOPY WITH MEDIAL MENISECTOMY;  Surgeon: Hessie Knows,  MD;  Location: [facility] ORS;  Service: Orthopedics;  Laterality: Right;  . KNEE ARTHROSCOPY WITH MEDIAL MENISECTOMY Right 12/12/2018   Procedure: KNEE ARTHROSCOPY WITH PARTIAL MEDIAL MENISECTOMY;  Surgeon: Leim Fabry, MD;  Location: [facility] ORS;  Service: Orthopedics;  Laterality: Right;  . meniscal tear     right knee Dr. Rudene Christians 2014   . MENISECTOMY Right 07/15/2017   Procedure: MENISECTOMY;  Surgeon: Hessie Knows, MD;  Location: [facility] ORS;  Service: Orthopedics;  Laterality: Right;  . ROBOTIC ASSISTED LAPAROSCOPIC NISSEN FUNDOPLICATION N/A 01/60/1093   Procedure: ROBOTIC ASSISTED LAPAROSCOPIC NISSEN FUNDOPLICATION WITH HIATAL HERNIA REPAIR;  Surgeon: Jules Husbands, MD;  Location: [facility] ORS;  Service: General;  Laterality: N/A;  . UMBILICAL HERNIA REPAIR N/A 03/03/2018   Procedure: HERNIA REPAIR UMBILICAL ADULT;  Surgeon: Jules Husbands, MD;  Location: [facility] ORS;  Service: General;  Laterality: N/A;   Family History  Problem Relation Age of Onset  . Drug abuse Mother   . Hyperlipidemia Mother   . Hypertension Mother   . Cancer Maternal Aunt        brain, breast, lung ? primary was smoker   . Prostate cancer Neg Hx   . Bladder Cancer Neg Hx   . Kidney cancer Neg Hx    Social History   Socioeconomic History  . Marital status: Divorced    Spouse name: Not on file  . Number of children: Not on file  . Years of education: Not on file  . Highest education level: Not on file  Occupational History  .  Not on file  Tobacco Use  . Smoking status: Never Smoker  . Smokeless tobacco: Never Used  . Tobacco comment: light in the past social smoker quit  Vaping Use  . Vaping Use: Never used  Substance and Sexual Activity  . Alcohol use: Yes    Alcohol/week: 1.0 - 2.0 standard drink    Types: 1 - 2 Shots of liquor per week  . Drug use: No  . Sexual activity: Yes  Other Topics Concern  . 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 Difficulty of Paying Living Expenses: Not on file  Food Insecurity:   . Worried About Charity fundraiser in the Last Year: Not on file  . Ran Out of Food in the Last Year: Not on file  Transportation Needs:   . Lack of Transportation (Medical): Not on file  . Lack of Transportation (Non-Medical): Not on file  Physical Activity:   . Days of Exercise per Week: Not on file  . Minutes of Exercise per Session: Not on file  Stress:   . Feeling of Stress : Not on file  Social Connections:   . Frequency of Communication with Friends and Family: Not on file  . Frequency of Social Gatherings with Friends and Family: Not on file  . Attends Religious Services: Not on file  . Active Member of Clubs or Organizations: Not on file  . Attends Archivist Meetings: Not on file  . Marital Status: Not on file  Intimate Partner Violence:   . Fear of Current or Ex-Partner: Not on file  . Emotionally Abused: Not on file  . Physically Abused: Not on file  . Sexually Abused: Not on file   Current Meds  Medication Sig  . ALPRAZolam (XANAX) 0.25 MG tablet Take 1 tablet (0.25 mg total) by mouth at bedtime as needed for anxiety.  Marland Kitchen buPROPion (WELLBUTRIN XL) 300 MG 24 hr tablet Take 1 tablet (300 mg total) by mouth daily.  . Olopatadine HCl 0.2 % SOLN Place 1 drop into both eyes daily as needed (runny eyes).  . tadalafil (CIALIS) 10 MG tablet Take 1 tablet (10 mg total) by mouth daily as needed for erectile dysfunction.  . valACYclovir (VALTREX) 500 MG tablet Take 1 tablet (500 mg total) by mouth See admin instructions. Take 500 mg daily for prevention. With outbreak take 500 mg twice daily for 3 to 7 days  . [DISCONTINUED] ALPRAZolam (XANAX) 0.25 MG tablet Take 1 tablet (0.25 mg total) by mouth at bedtime as needed for anxiety.   No Known  Allergies No results found for this or any previous visit (from the past 2160 hour(s)). Objective  Body mass index is 36.21 kg/m. Wt Readings from Last 3 Encounters:  02/29/20 267 lb (121.1 kg)  02/08/20 267 lb 3.2 oz (121.2 kg)  11/10/19 265 lb (120.2 kg)   Temp Readings from Last 3 Encounters:  02/29/20 98.2 F (36.8 C) (Oral)  02/08/20 98.2 F (36.8 C)  11/10/19 (!) 97.5 F (36.4 C) (Temporal)   BP Readings from Last 3 Encounters:  02/29/20 128/78  02/08/20 135/81  11/10/19 118/84   Pulse Readings from Last 3 Encounters:  02/29/20 97  02/08/20 71  11/10/19 76    Physical  Exam Vitals and nursing note reviewed.  Constitutional:      Appearance: Normal appearance. He is well-developed and well-groomed. He is obese.  HENT:     Head: Normocephalic.  Eyes:     Conjunctiva/sclera: Conjunctivae normal.     Pupils: Pupils are equal, round, and reactive to light.  Cardiovascular:     Rate and Rhythm: Normal rate and regular rhythm.     Heart sounds: Normal heart sounds. No murmur heard.   Pulmonary:     Effort: Pulmonary effort is normal.     Breath sounds: Normal breath sounds.  Skin:    General: Skin is warm and dry.  Neurological:     General: No focal deficit present.     Mental Status: He is alert and oriented to person, place, and time. Mental status is at baseline.     Gait: Gait normal.  Psychiatric:        Attention and Perception: Attention and perception normal.        Mood and Affect: Mood and affect normal.        Speech: Speech normal.        Behavior: Behavior normal. Behavior is cooperative.        Thought Content: Thought content normal.        Cognition and Memory: Cognition and memory normal.        Judgment: Judgment normal.     Assessment  Plan  Annual physical exam Needs flu shot - Plan: Flu Vaccine QUAD 6+ mos PF IM (Fluarix Quad PF) Flu shotgiven today UTD Tdap MMR immune hep Bhad 3/3 vaccines consider new hep b vx x 2doses pt  will call to schedule covid 2/2 disc booster   Stdutd  Colonoscopy11/2020 neg rec healthy diet and exercise  Disc the next 56 days  PSAnl 06/27/19 -DRE in futureage 48 D3 5000 IU and mvt rec Fasting labs 06/2020 Form filled out for work   [employer] TB - Plan: QuantiFERON-TB Gold Plus  Anxiety and depression - Plan: ALPRAZolam Duanne Moron) 0.25 MG tablet   Provider: Dr. Olivia Mackie McLean-Scocuzza-Internal Medicine

## 2020-02-29 NOTE — Patient Instructions (Addendum)
Results for Joshua Ramirez, Joshua Ramirez (MRN [id number]) as of 02/29/2020 10:41  Ref. Range 06/24/2018 12:55  Rubella Latest Units: index 7.19  Results for Joshua Ramirez, Joshua Ramirez (MRN [id number]) as of 02/29/2020 10:42  Ref. Range 06/24/2018 12:55  Mumps IgG Latest Units: AU/mL 193.00  Results for Joshua Ramirez, Joshua Ramirez (MRN [id number]) as of 02/29/2020 10:42  Ref. Range 06/24/2018 12:55  Rubeola IgG Latest Units: AU/mL 178.00

## 2020-03-02 LAB — QUANTIFERON-TB GOLD PLUS
Mitogen-NIL: 8.36 IU/mL
NIL: 0.03 IU/mL
QuantiFERON-TB Gold Plus: NEGATIVE
TB1-NIL: 0 IU/mL
TB2-NIL: 0 IU/mL

## 2020-04-02 IMAGING — US US SCROTUM W/ DOPPLER COMPLETE
1 series · 13 of 25 positions shown · non-contrast
Comparison: CT scan of the abdomen and pelvis of January 03, 2018

CLINICAL DATA: Testicular calcifications demonstrated on CT scan January 03, 2018

EXAM:
SCROTAL ULTRASOUND
DOPPLER ULTRASOUND OF THE TESTICLES
TECHNIQUE: Complete ultrasound examination of the testicles, epididymis, and
other scrotal structures was performed. Color and spectral Doppler
ultrasound were also utilized to evaluate blood flow to the
testicles.

[Series 1: us scrotum w/ doppler complete · 0.06mm/px · 76 acquisitions, 13 frames shown]
[im 1/76]
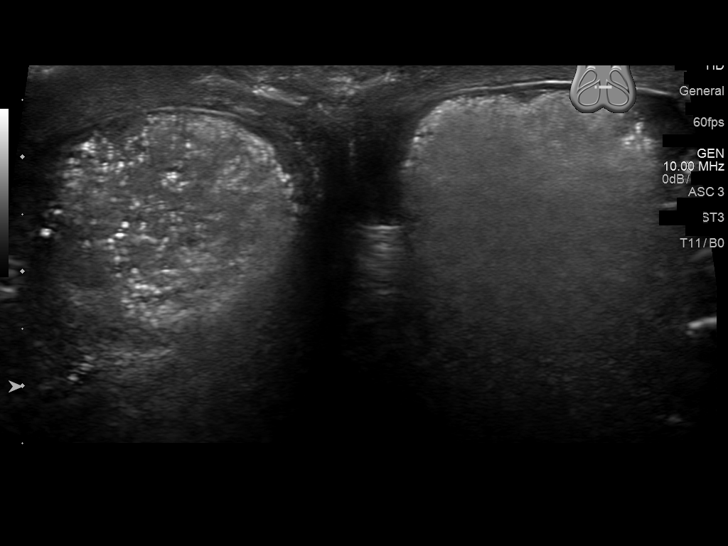
[im 7/76]
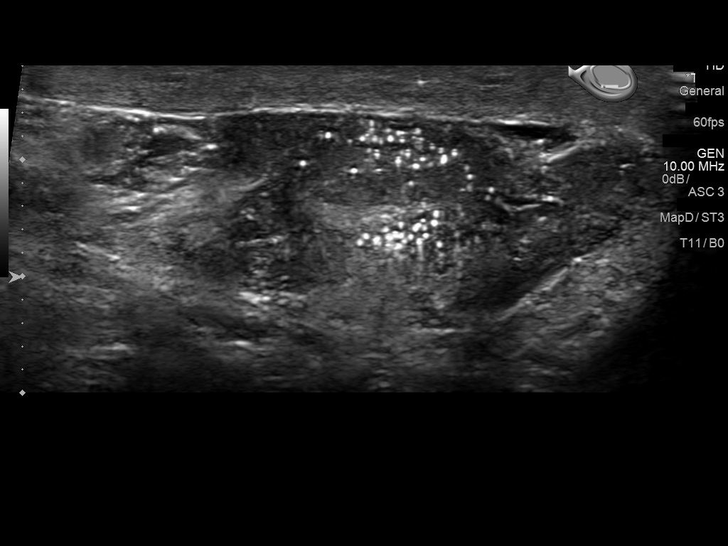
[im 13/76]
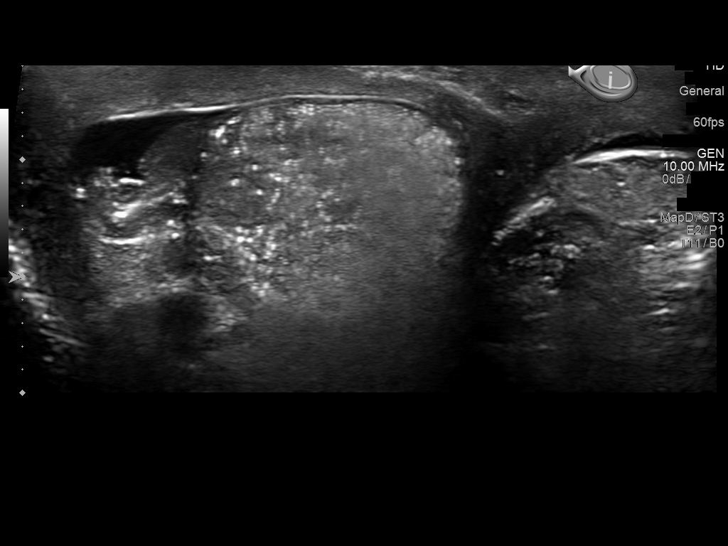
[im 19/76]
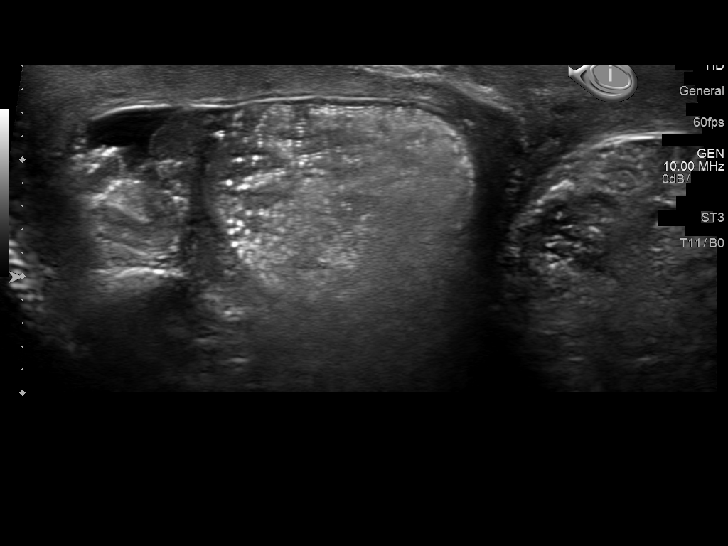
[im 26/76]
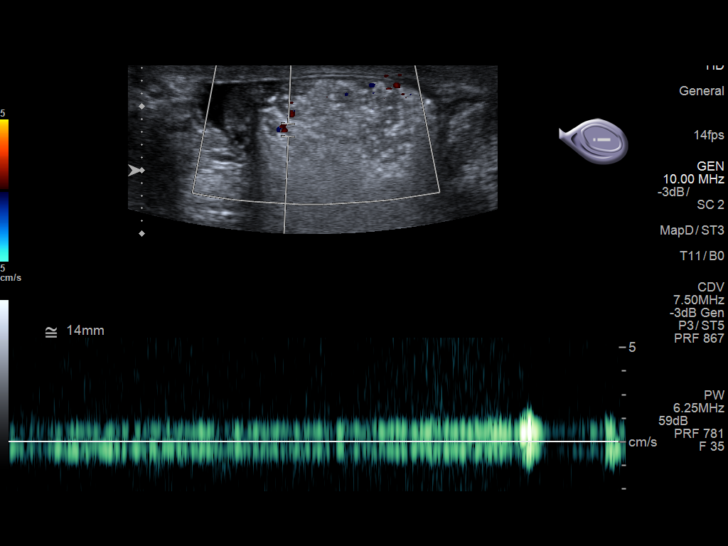
[im 32/76]
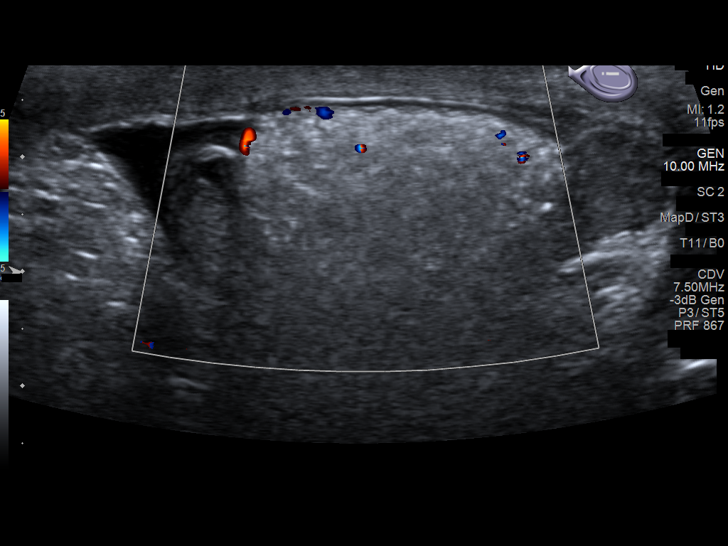
[im 38/76]
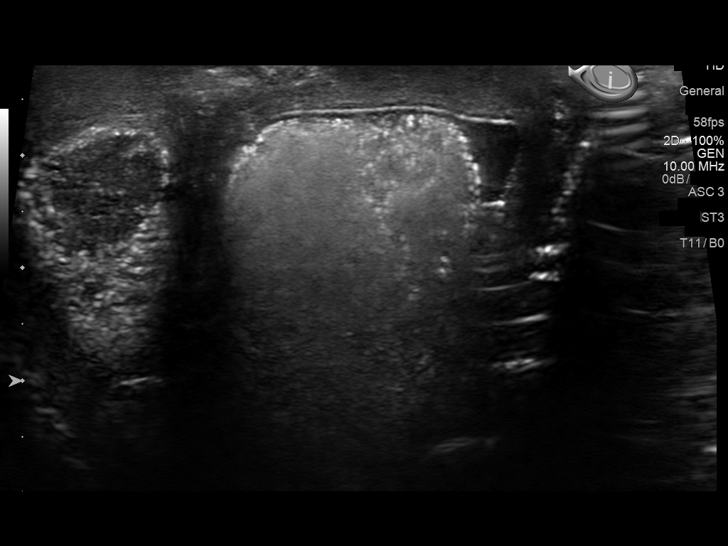
[im 44/76]
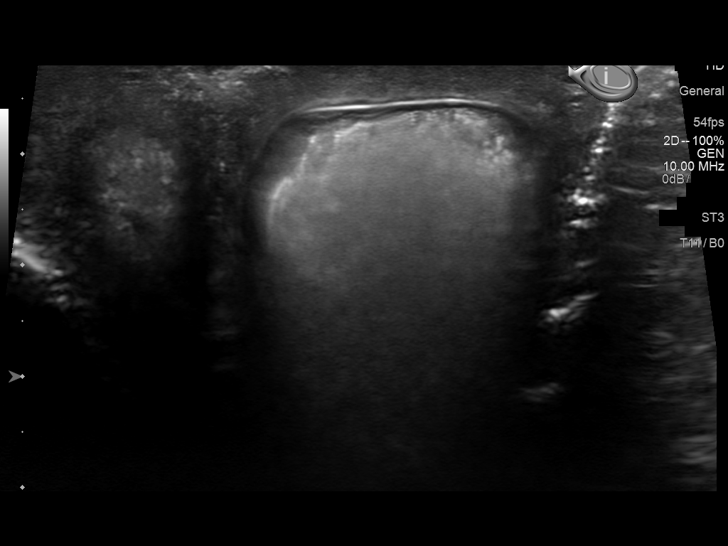
[im 51/76]
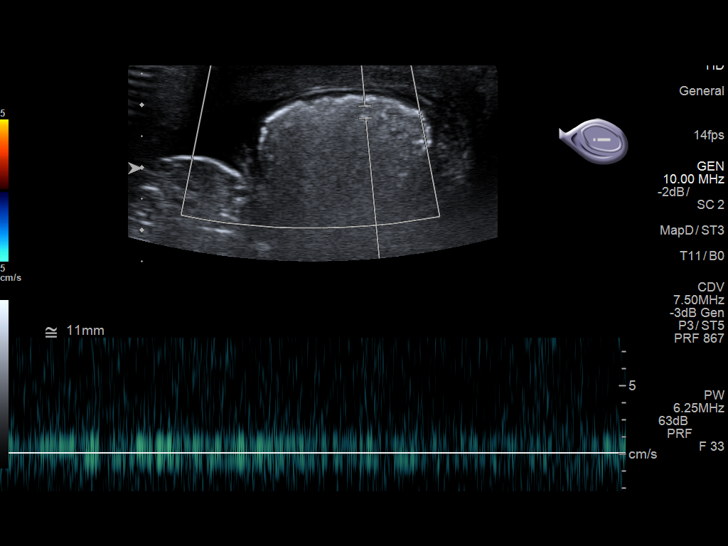
[im 57/76]
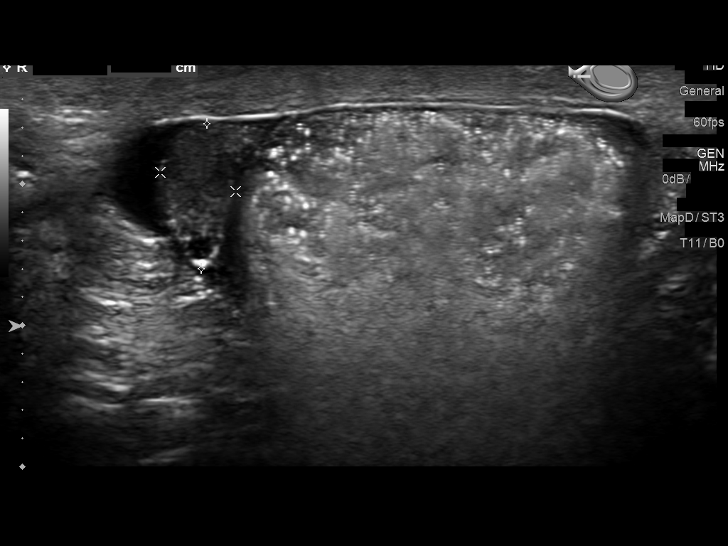
[im 63/76]
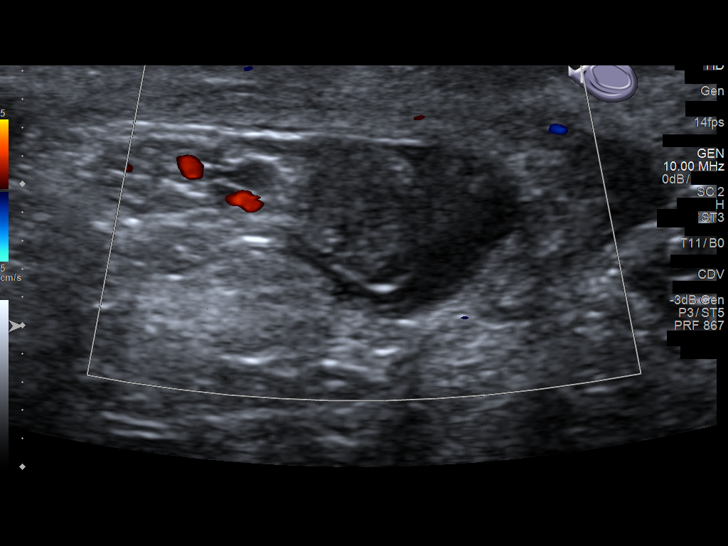
[im 69/76]
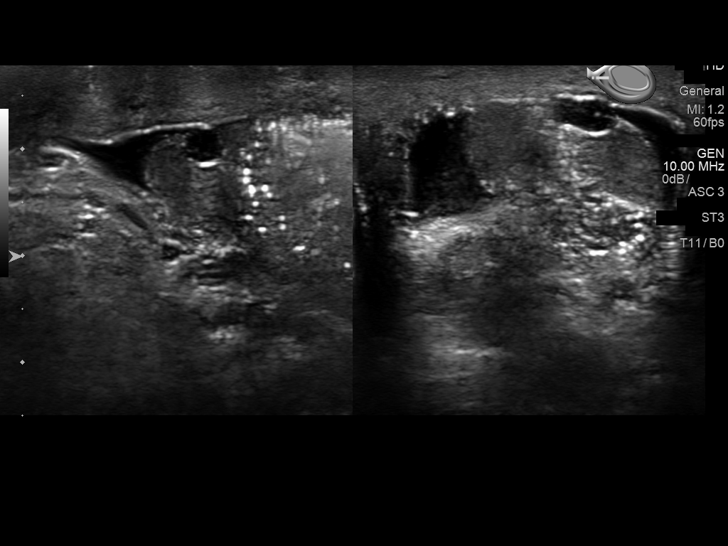
[im 76/76]
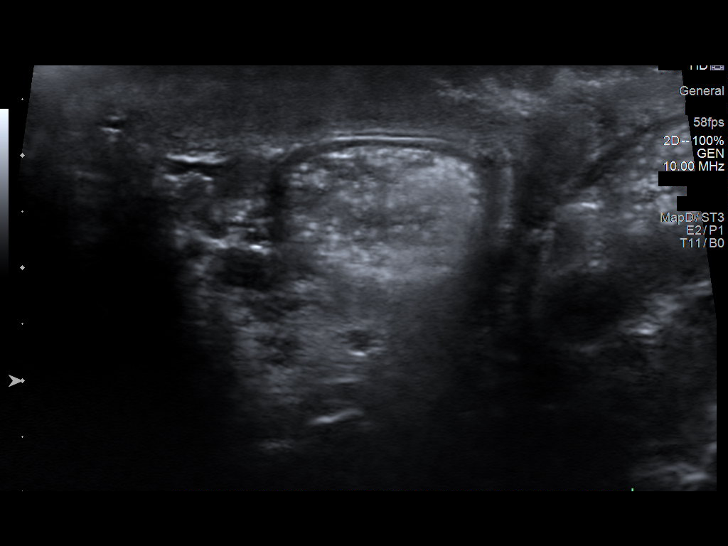

[13 of 25 positions shown; findings below may reference images not displayed]

FINDINGS: Right testicle

Measurements: 3.3 x 1.8 x 2.3 cm.. Innumerable parenchymal
calcifications are present. There is no discrete testicular mass.

Left testicle

Measurements: 3.1 x 1.8 x 2.3 cm.. There are scattered
calcifications within the left testicle but not as numerous as those
on the right. No discrete mass is observed.

Right epididymis: There is a tiny right epididymal cyst measuring 2
mm in diameter.

Left epididymis: There is a tiny left epididymal cyst measuring 3 mm
in diameter.

Hydrocele:  None visualized.

Varicocele:  None visualized.

Pulsed Doppler interrogation of both testes demonstrates normal low
resistance arterial and venous waveforms bilaterally.
IMPRESSION: Testicular microlithiasis bilaterally greatest on the right. No
suspicious testicular masses.

Tiny epididymal cysts bilaterally.

Normal vascularity of the testes and epididymal structures.

## 2020-05-08 IMAGING — CT CT ABD-PELV W/ CM
2 of 5 series · 15 of 46 positions shown, 17 images · IV contrast (iopamidol)
Comparison: None.

CLINICAL DATA: Abdominal pain and swelling.

EXAM:
CT ABDOMEN AND PELVIS WITH CONTRAST
TECHNIQUE: Multidetector CT imaging of the abdomen and pelvis was performed
using the standard protocol following bolus administration of
intravenous contrast.
CONTRAST:  85mL PEN2JT-FMX IOPAMIDOL (PEN2JT-FMX) INJECTION 76%

[Series 2: abd pelvis · axial · 0.81mm/px · z∈[-1609,-1099]mm · 12 of 116 slices shown, 14 images (1 of 2)]
[im 7/116  soft-tissue]
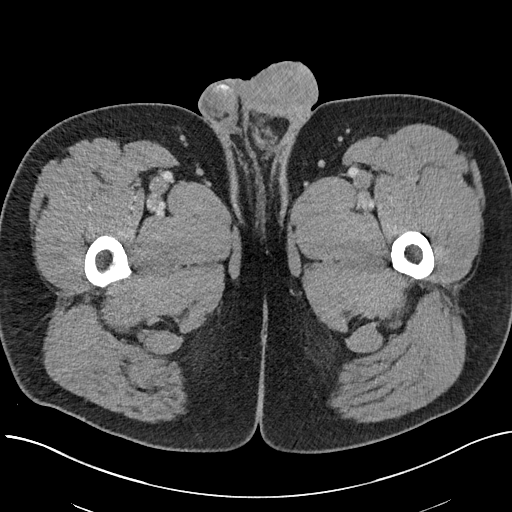
[im 7/116  bone]
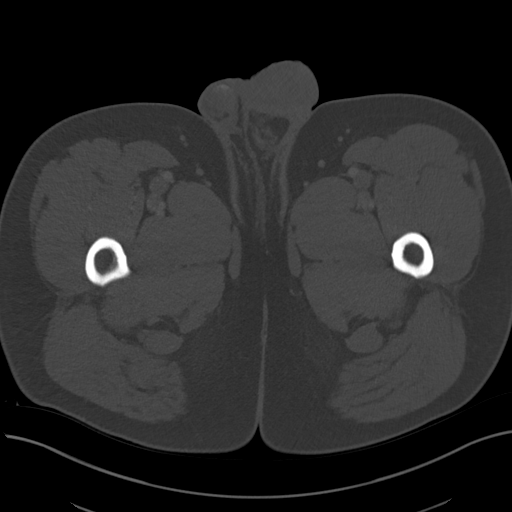
[im 20/116  soft-tissue]
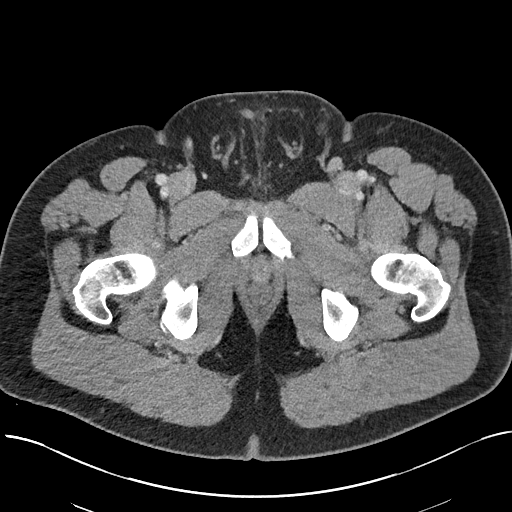
[im 26/116  soft-tissue]
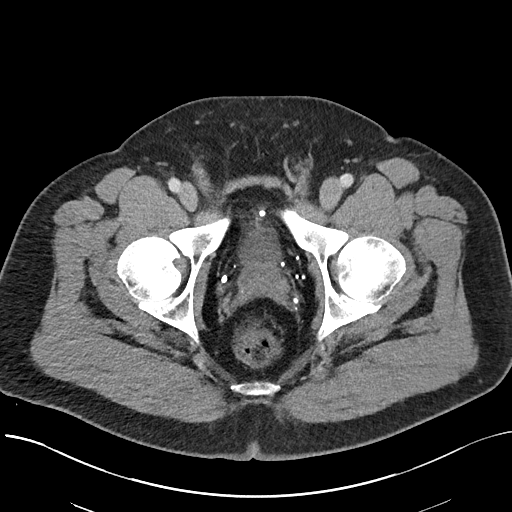
[im 32/116  soft-tissue]
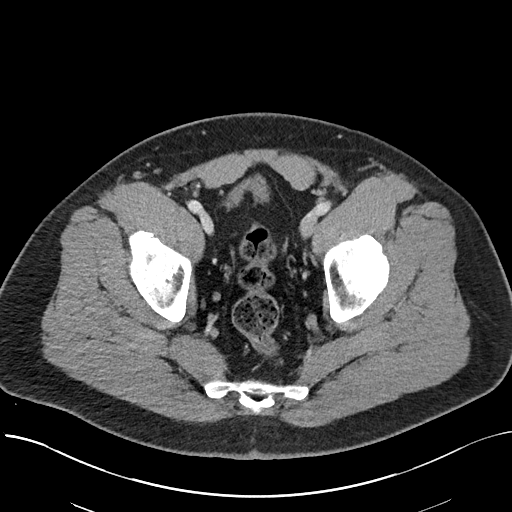
[im 45/116  soft-tissue]
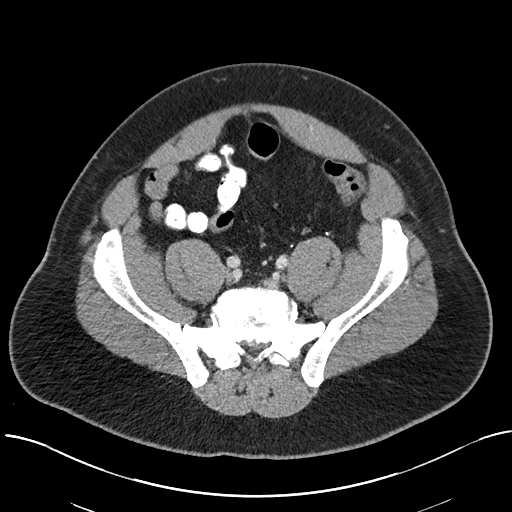
[im 52/116  soft-tissue]
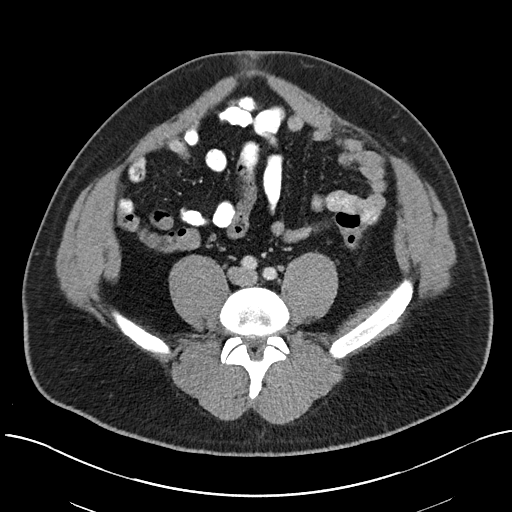
[im 64/116  soft-tissue]
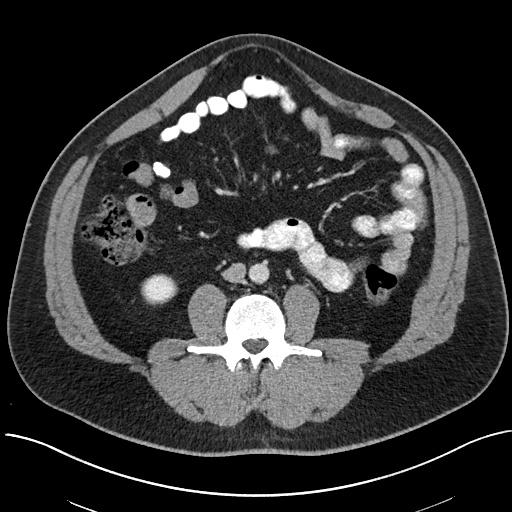
[im 71/116  soft-tissue]
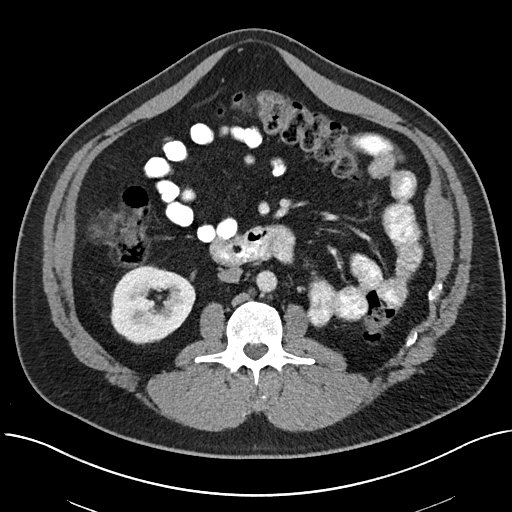
[im 84/116  soft-tissue]
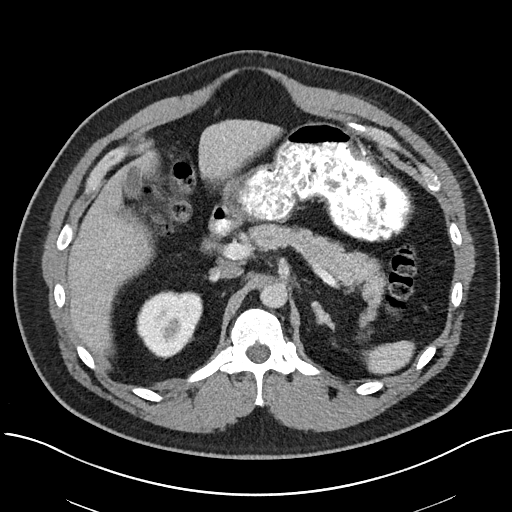
[im 84/116  bone]
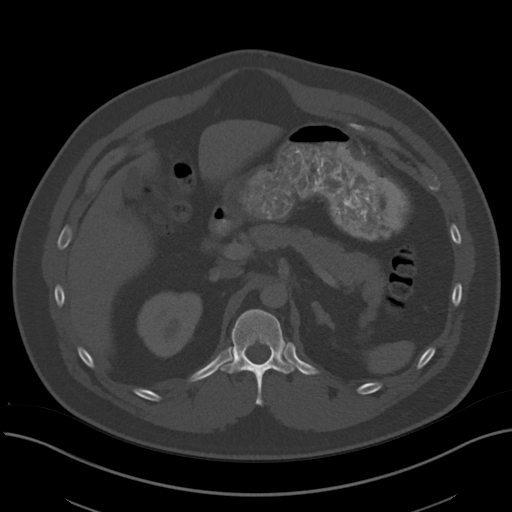
[im 90/116  soft-tissue]
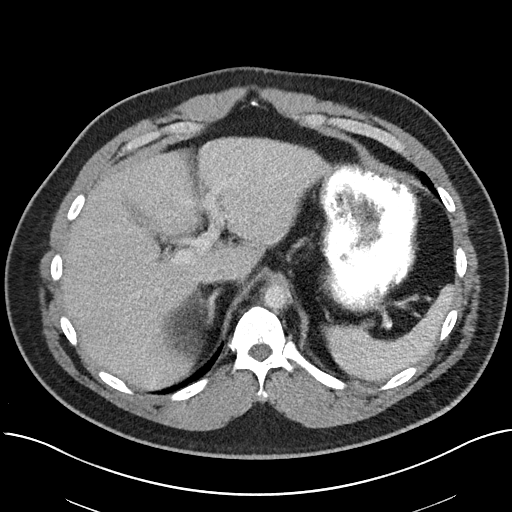
[im 96/116  soft-tissue]
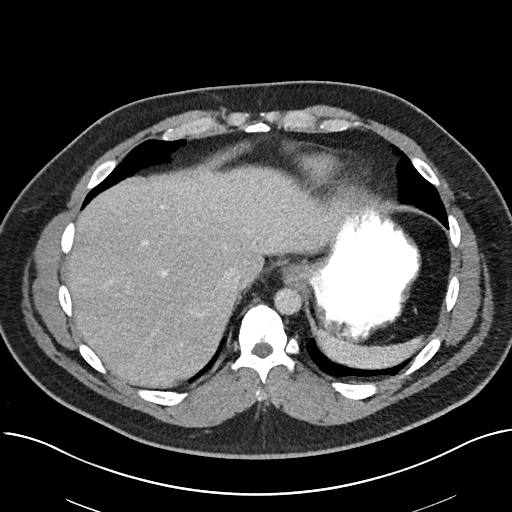
[im 109/116  soft-tissue]
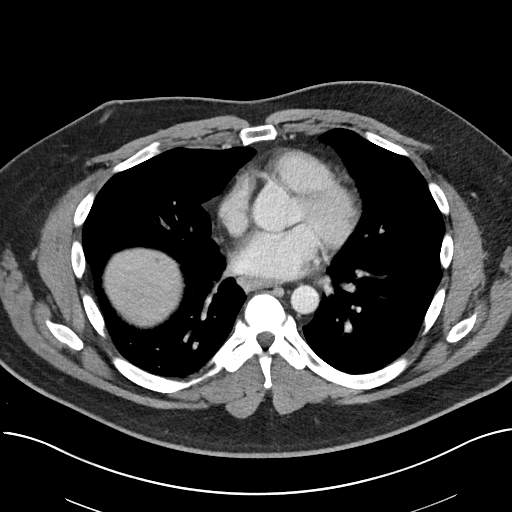

[Series 4: abd pelvis · coronal · 0.76mm/px · 3 of 176 slices shown (2 of 2)]
[im 59/176  soft-tissue]
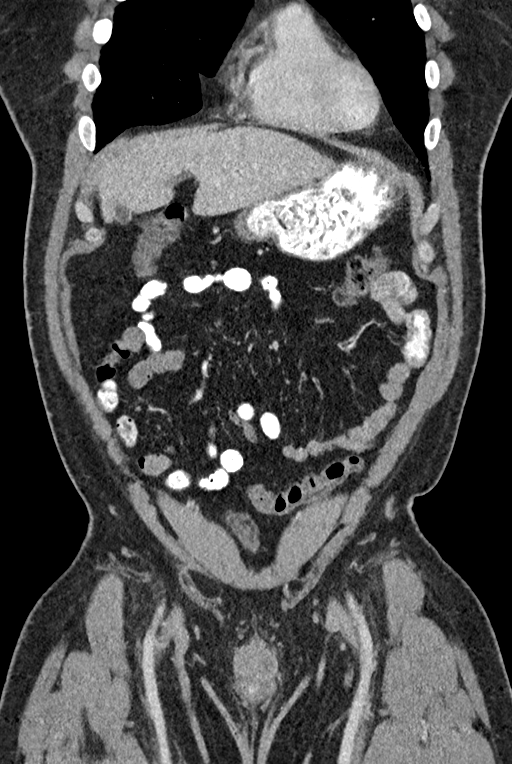
[im 78/176  soft-tissue]
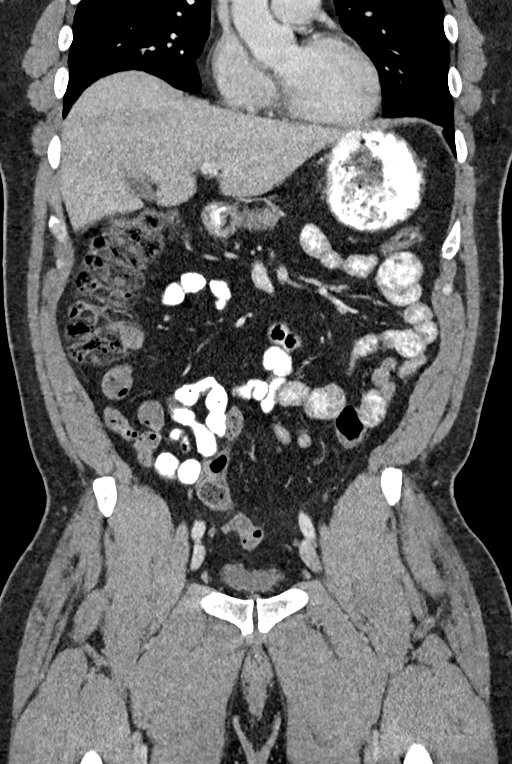
[im 98/176  soft-tissue]
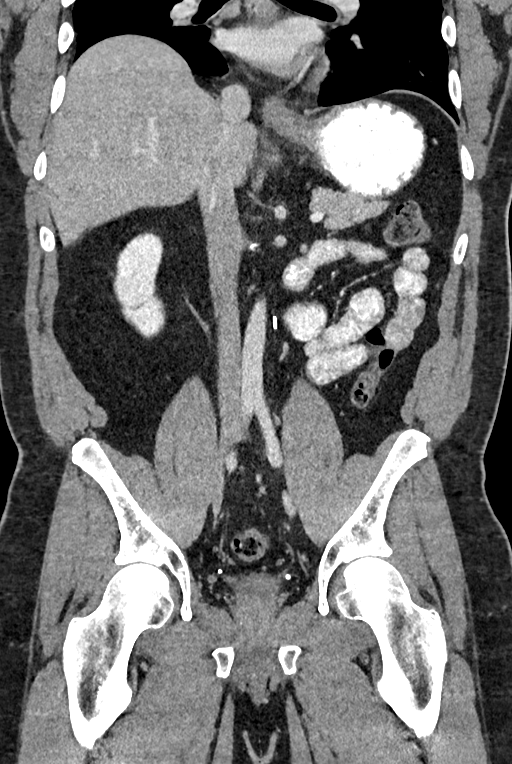

[15 of 46 positions shown; findings below may reference images not displayed]

FINDINGS: Lower chest: Moderate breathing motion artifact but no obvious
pulmonary lesions. Moderate eventration of the right hemidiaphragm
with overlying atelectasis. The heart is normal in size. No
pericardial effusion. Small hiatal hernia noted.

Hepatobiliary: No focal hepatic lesions or intrahepatic biliary
dilatation. The gallbladder is normal. No common bile duct
dilatation.

Pancreas: No mass, inflammation or ductal dilatation.

Spleen: Normal size.  No focal lesions.

Adrenals/Urinary Tract: The adrenal glands are normal.

The left kidney is surgically absent.  The kidney was donated.

The right kidney demonstrates mild compensatory hypertrophy. No
renal lesions or hydronephrosis. No renal or ureteral calculi. No
bladder calculi.

Stomach/Bowel: The stomach, duodenum, small bowel and colon are
unremarkable. No acute inflammatory changes, mass lesions or
obstructive findings. The terminal ileum and appendix are normal.

Vascular/Lymphatic: The aorta is normal in caliber. No dissection.
Minimal scattered atherosclerotic calcifications. The branch vessels
are patent. The major venous structures are patent. No mesenteric or
retroperitoneal mass or adenopathy. Small scattered lymph nodes are
noted.

Reproductive: The prostate gland and seminal vesicles are
unremarkable.

Partially calcified lesions are noted in both hemiscrotum. These
could be the patient's testicles. This could be related to mumps.
Recommend clinical correlation. Scrotal ultrasound may be helpful
for further evaluation.

Other: Bulging anterior abdominal wall with a stretched Stanislav Lage
but no discrete separation or defect.

Musculoskeletal: No significant bony findings. Advanced degenerate
disc disease noted at L5-S1.
IMPRESSION: 1. Diffuse bulging anterior abdominal wall with a stretched and
bulging Hijebi Tamboura but no discrete defect.
2. No acute abdominal/pelvic findings, mass lesions or adenopathy.
3. Possibly small, partially calcified testicles versus
extratesticular calcifications but incompletely imaged. Recommend
clinical correlation. Scrotal ultrasound may be helpful for for
evaluation..
4. Status post left nephrectomy.

## 2020-07-16 ENCOUNTER — Other Ambulatory Visit (INDEPENDENT_AMBULATORY_CARE_PROVIDER_SITE_OTHER): Payer: BC Managed Care – PPO

## 2020-07-16 ENCOUNTER — Other Ambulatory Visit: Payer: Self-pay

## 2020-07-16 DIAGNOSIS — Z125 Encounter for screening for malignant neoplasm of prostate: Secondary | ICD-10-CM

## 2020-07-16 DIAGNOSIS — Z1159 Encounter for screening for other viral diseases: Secondary | ICD-10-CM

## 2020-07-16 DIAGNOSIS — Z0184 Encounter for antibody response examination: Secondary | ICD-10-CM

## 2020-07-16 DIAGNOSIS — Z1329 Encounter for screening for other suspected endocrine disorder: Secondary | ICD-10-CM

## 2020-07-16 DIAGNOSIS — E559 Vitamin D deficiency, unspecified: Secondary | ICD-10-CM | POA: Diagnosis not present

## 2020-07-16 DIAGNOSIS — R7989 Other specified abnormal findings of blood chemistry: Secondary | ICD-10-CM | POA: Diagnosis not present

## 2020-07-16 DIAGNOSIS — Z0000 Encounter for general adult medical examination without abnormal findings: Secondary | ICD-10-CM | POA: Diagnosis not present

## 2020-07-16 DIAGNOSIS — Z1389 Encounter for screening for other disorder: Secondary | ICD-10-CM

## 2020-07-16 LAB — LIPID PANEL
Cholesterol: 208 mg/dL — ABNORMAL HIGH (ref 0–200)
HDL: 48.7 mg/dL (ref >39.00)
LDL Cholesterol: 129 mg/dL — ABNORMAL HIGH (ref 0–99)
NonHDL: 159.38
Total CHOL/HDL Ratio: 4
Triglycerides: 153 mg/dL — ABNORMAL HIGH (ref 0.0–149.0)
VLDL: 30.6 mg/dL (ref 0.0–40.0)

## 2020-07-16 LAB — CBC WITH DIFFERENTIAL/PLATELET
Basophils Absolute: 0 10*3/uL (ref 0.0–0.1)
Basophils Relative: 0.6 % (ref 0.0–3.0)
Eosinophils Absolute: 0.4 10*3/uL (ref 0.0–0.7)
Eosinophils Relative: 7.3 % — ABNORMAL HIGH (ref 0.0–5.0)
HCT: 44.5 % (ref 39.0–52.0)
Hemoglobin: 14.9 g/dL (ref 13.0–17.0)
Lymphocytes Relative: 32.6 % (ref 12.0–46.0)
Lymphs Abs: 1.6 10*3/uL (ref 0.7–4.0)
MCHC: 33.5 g/dL (ref 30.0–36.0)
MCV: 86.2 fl (ref 78.0–100.0)
Monocytes Absolute: 0.5 10*3/uL (ref 0.1–1.0)
Monocytes Relative: 10.9 % (ref 3.0–12.0)
Neutro Abs: 2.4 10*3/uL (ref 1.4–7.7)
Neutrophils Relative %: 48.6 % (ref 43.0–77.0)
Platelets: 203 10*3/uL (ref 150.0–400.0)
RBC: 5.17 Mil/uL (ref 4.22–5.81)
RDW: 14.4 % (ref 11.5–15.5)
WBC: 5 10*3/uL (ref 4.0–10.5)

## 2020-07-16 LAB — COMPREHENSIVE METABOLIC PANEL
ALT: 19 U/L (ref 0–53)
AST: 18 U/L (ref 0–37)
Albumin: 4.1 g/dL (ref 3.5–5.2)
Alkaline Phosphatase: 64 U/L (ref 39–117)
BUN: 14 mg/dL (ref 6–23)
CO2: 30 mEq/L (ref 19–32)
Calcium: 9.8 mg/dL (ref 8.4–10.5)
Chloride: 103 mEq/L (ref 96–112)
Creatinine, Ser: 1.32 mg/dL (ref 0.40–1.50)
GFR: 64.77 mL/min (ref >60.00)
Glucose, Bld: 96 mg/dL (ref 70–99)
Potassium: 4.7 mEq/L (ref 3.5–5.1)
Sodium: 139 mEq/L (ref 135–145)
Total Bilirubin: 0.6 mg/dL (ref 0.2–1.2)
Total Protein: 7 g/dL (ref 6.0–8.3)

## 2020-07-16 LAB — PSA: PSA: 0.48 ng/mL (ref 0.10–4.00)

## 2020-07-16 LAB — TESTOSTERONE: Testosterone: 340.11 ng/dL (ref 300.00–890.00)

## 2020-07-16 LAB — VITAMIN D 25 HYDROXY (VIT D DEFICIENCY, FRACTURES): VITD: 28.67 ng/mL — ABNORMAL LOW (ref 30.00–100.00)

## 2020-07-16 LAB — TSH: TSH: 2.17 u[IU]/mL (ref 0.35–4.50)

## 2020-07-17 LAB — URINALYSIS, ROUTINE W REFLEX MICROSCOPIC
Bilirubin Urine: NEGATIVE
Glucose, UA: NEGATIVE
Hgb urine dipstick: NEGATIVE
Ketones, ur: NEGATIVE
Leukocytes,Ua: NEGATIVE
Nitrite: NEGATIVE
Protein, ur: NEGATIVE
Specific Gravity, Urine: 1.016 (ref 1.001–1.03)
pH: 6 (ref 5.0–8.0)

## 2020-07-17 LAB — HEPATITIS B SURFACE ANTIBODY, QUANTITATIVE: Hep B S AB Quant (Post): 606 m[IU]/mL (ref >=10)

## 2020-08-29 ENCOUNTER — Other Ambulatory Visit: Payer: Self-pay

## 2020-08-29 ENCOUNTER — Ambulatory Visit: Payer: BC Managed Care – PPO | Admitting: Internal Medicine

## 2020-08-29 ENCOUNTER — Encounter: Payer: Self-pay | Admitting: Internal Medicine

## 2020-08-29 VITALS — BP 112/80 | HR 84 | Temp 98.2°F | Ht 72.0 in | Wt 272.6 lb

## 2020-08-29 DIAGNOSIS — M25561 Pain in right knee: Secondary | ICD-10-CM

## 2020-08-29 DIAGNOSIS — M5431 Sciatica, right side: Secondary | ICD-10-CM | POA: Diagnosis not present

## 2020-08-29 DIAGNOSIS — F419 Anxiety disorder, unspecified: Secondary | ICD-10-CM

## 2020-08-29 DIAGNOSIS — M5441 Lumbago with sciatica, right side: Secondary | ICD-10-CM

## 2020-08-29 DIAGNOSIS — F32A Depression, unspecified: Secondary | ICD-10-CM

## 2020-08-29 DIAGNOSIS — H04203 Unspecified epiphora, bilateral lacrimal glands: Secondary | ICD-10-CM | POA: Diagnosis not present

## 2020-08-29 DIAGNOSIS — B009 Herpesviral infection, unspecified: Secondary | ICD-10-CM

## 2020-08-29 DIAGNOSIS — S76301D Unspecified injury of muscle, fascia and tendon of the posterior muscle group at thigh level, right thigh, subsequent encounter: Secondary | ICD-10-CM

## 2020-08-29 DIAGNOSIS — G8929 Other chronic pain: Secondary | ICD-10-CM

## 2020-08-29 MED ORDER — TADALAFIL 10 MG PO TABS: 10.0000 mg | ORAL_TABLET | Freq: Every day | ORAL | 6 refills | Status: DC | PRN

## 2020-08-29 MED ORDER — VALACYCLOVIR HCL 500 MG PO TABS: 500.0000 mg | ORAL_TABLET | ORAL | 3 refills | Status: DC

## 2020-08-29 MED ORDER — OLOPATADINE HCL 0.2 % OP SOLN: 1.0000 [drp] | Freq: Every day | OPHTHALMIC | 11 refills | Status: DC | PRN

## 2020-08-29 MED ORDER — TRAMADOL HCL 50 MG PO TABS: 50.0000 mg | ORAL_TABLET | Freq: Two times a day (BID) | ORAL | 0 refills | Status: DC | PRN

## 2020-08-29 MED ORDER — BUPROPION HCL ER (XL) 300 MG PO TB24: 300.0000 mg | ORAL_TABLET | Freq: Every day | ORAL | 3 refills | Status: DC

## 2020-08-29 MED ORDER — CYCLOBENZAPRINE HCL 5 MG PO TABS: 5.0000 mg | ORAL_TABLET | Freq: Every evening | ORAL | 2 refills | Status: DC | PRN

## 2020-08-29 MED ORDER — METHYLPREDNISOLONE ACETATE 40 MG/ML IJ SUSP
80.0000 mg | Freq: Once | INTRAMUSCULAR | Status: AC
  Administered 2020-08-29: 80 mg via INTRAMUSCULAR

## 2020-08-29 MED ORDER — ALPRAZOLAM 0.25 MG PO TABS: 0.2500 mg | ORAL_TABLET | Freq: Every evening | ORAL | 5 refills | Status: DC | PRN

## 2020-08-29 NOTE — Patient Instructions (Addendum)
Consider PT  Consider covid 19 booster  Lidocaine or salonpas pain patch otc  Heat  Muscle relaxer  Tylenol and Advil Back Exercises The following exercises strengthen the muscles that help to support the trunk and back. They also help to keep the lower back flexible. Doing these exercises can help to prevent back pain or lessen existing pain.  If you have back pain or discomfort, try doing these exercises 2-3 times each day or as told by your health care provider.  As your pain improves, do them once each day, but increase the number of times that you repeat the steps for each exercise (do more repetitions).  To prevent the recurrence of back pain, continue to do these exercises once each day or as told by your health care provider. Do exercises exactly as told by your health care provider and adjust them as directed. It is normal to feel mild stretching, pulling, tightness, or discomfort as you do these exercises, but you should stop right away if you feel sudden pain or your pain gets worse. Exercises Single knee to chest Repeat these steps 3-5 times for each leg: 1. Lie on your back on a firm bed or the floor with your legs extended. 2. Bring one knee to your chest. Your other leg should stay extended and in contact with the floor. 3. Hold your knee in place by grabbing your knee or thigh with both hands and hold. 4. Pull on your knee until you feel a gentle stretch in your lower back or buttocks. 5. Hold the stretch for 10-30 seconds. 6. Slowly release and straighten your leg. Pelvic tilt Repeat these steps 5-10 times: 1. Lie on your back on a firm bed or the floor with your legs extended. 2. Bend your knees so they are pointing toward the ceiling and your feet are flat on the floor. 3. Tighten your lower abdominal muscles to press your lower back against the floor. This motion will tilt your pelvis so your tailbone points up toward the ceiling instead of pointing to your feet or the  floor. 4. With gentle tension and even breathing, hold this position for 5-10 seconds. Cat-cow Repeat these steps until your lower back becomes more flexible: 1. Get into a hands-and-knees position on a firm surface. Keep your hands under your shoulders, and keep your knees under your hips. You may place padding under your knees for comfort. 2. Let your head hang down toward your chest. Contract your abdominal muscles and point your tailbone toward the floor so your lower back becomes rounded like the back of a cat. 3. Hold this position for 5 seconds. 4. Slowly lift your head, let your abdominal muscles relax and point your tailbone up toward the ceiling so your back forms a sagging arch like the back of a cow. 5. Hold this position for 5 seconds.   Press-ups Repeat these steps 5-10 times: 1. Lie on your abdomen (face-down) on the floor. 2. Place your palms near your head, about shoulder-width apart. 3. Keeping your back as relaxed as possible and keeping your hips on the floor, slowly straighten your arms to raise the top half of your body and lift your shoulders. Do not use your back muscles to raise your upper torso. You may adjust the placement of your hands to make yourself more comfortable. 4. Hold this position for 5 seconds while you keep your back relaxed. 5. Slowly return to lying flat on the floor.   Bridges Repeat  these steps 10 times: 1. Lie on your back on a firm surface. 2. Bend your knees so they are pointing toward the ceiling and your feet are flat on the floor. Your arms should be flat at your sides, next to your body. 3. Tighten your buttocks muscles and lift your buttocks off the floor until your waist is at almost the same height as your knees. You should feel the muscles working in your buttocks and the back of your thighs. If you do not feel these muscles, slide your feet 1-2 inches farther away from your buttocks. 4. Hold this position for 3-5 seconds. 5. Slowly lower  your hips to the starting position, and allow your buttocks muscles to relax completely. If this exercise is too easy, try doing it with your arms crossed over your chest.   Abdominal crunches Repeat these steps 5-10 times: 1. Lie on your back on a firm bed or the floor with your legs extended. 2. Bend your knees so they are pointing toward the ceiling and your feet are flat on the floor. 3. Cross your arms over your chest. 4. Tip your chin slightly toward your chest without bending your neck. 5. Tighten your abdominal muscles and slowly raise your trunk (torso) high enough to lift your shoulder blades a tiny bit off the floor. Avoid raising your torso higher than that because it can put too much stress on your low back and does not help to strengthen your abdominal muscles. 6. Slowly return to your starting position. Back lifts Repeat these steps 5-10 times: 1. Lie on your abdomen (face-down) with your arms at your sides, and rest your forehead on the floor. 2. Tighten the muscles in your legs and your buttocks. 3. Slowly lift your chest off the floor while you keep your hips pressed to the floor. Keep the back of your head in line with the curve in your back. Your eyes should be looking at the floor. 4. Hold this position for 3-5 seconds. 5. Slowly return to your starting position. Contact a health care provider if:  Your back pain or discomfort gets much worse when you do an exercise.  Your worsening back pain or discomfort does not lessen within 2 hours after you exercise. If you have any of these problems, stop doing these exercises right away. Do not do them again unless your health care provider says that you can. Get help right away if:  You develop sudden, severe back pain. If this happens, stop doing the exercises right away. Do not do them again unless your health care provider says that you can. This information is not intended to replace advice given to you by your health care  provider. Make sure you discuss any questions you have with your health care provider. Document Revised: 09/15/2018 Document Reviewed: 02/10/2018 Elsevier Patient Education  2021 [employer]  Sciatica Rehab Ask your health care provider which exercises are safe for you. Do exercises exactly as told by your health care provider and adjust them as directed. It is normal to feel mild stretching, pulling, tightness, or discomfort as you do these exercises. Stop right away if you feel sudden pain or your pain gets worse. Do not begin these exercises until told by your health care provider. Stretching and range-of-motion exercises These exercises warm up your muscles and joints and improve the movement and flexibility of your hips and back. These exercises also help to relieve pain, numbness, and tingling. Sciatic nerve glide 1. Sit  in a chair with your head facing down toward your chest. Place your hands behind your back. Let your shoulders slump forward. 2. Slowly straighten one of your legs while you tilt your head back as if you are looking toward the ceiling. Only straighten your leg as far as you can without making your symptoms worse. 3. Hold this position for __________ seconds. 4. Slowly return to the starting position. 5. Repeat with your other leg. Repeat __________ times. Complete this exercise __________ times a day. Knee to chest with hip adduction and internal rotation 1. Lie on your back on a firm surface with both legs straight. 2. Bend one of your knees and move it up toward your chest until you feel a gentle stretch in your lower back and buttock. Then, move your knee toward the shoulder that is on the opposite side from your leg. This is hip adduction and internal rotation. ? Hold your leg in this position by holding on to the front of your knee. 3. Hold this position for __________ seconds. 4. Slowly return to the starting position. 5. Repeat with your other leg. Repeat  __________ times. Complete this exercise __________ times a day.   Prone extension on elbows 1. Lie on your abdomen on a firm surface. A bed may be too soft for this exercise. 2. Prop yourself up on your elbows. 3. Use your arms to help lift your chest up until you feel a gentle stretch in your abdomen and your lower back. ? This will place some of your body weight on your elbows. If this is uncomfortable, try stacking pillows under your chest. ? Your hips should stay down, against the surface that you are lying on. Keep your hip and back muscles relaxed. 4. Hold this position for __________ seconds. 5. Slowly relax your upper body and return to the starting position. Repeat __________ times. Complete this exercise __________ times a day.   Strengthening exercises These exercises build strength and endurance in your back. Endurance is the ability to use your muscles for a long time, even after they get tired. Pelvic tilt This exercise strengthens the muscles that lie deep in the abdomen. 1. Lie on your back on a firm surface. Bend your knees and keep your feet flat on the floor. 2. Tense your abdominal muscles. Tip your pelvis up toward the ceiling and flatten your lower back into the floor. ? To help with this exercise, you may place a small towel under your lower back and try to push your back into the towel. 3. Hold this position for __________ seconds. 4. Let your muscles relax completely before you repeat this exercise. Repeat __________ times. Complete this exercise __________ times a day. Alternating arm and leg raises 1. Get on your hands and knees on a firm surface. If you are on a hard floor, you may want to use padding, such as an exercise mat, to cushion your knees. 2. Line up your arms and legs. Your hands should be directly below your shoulders, and your knees should be directly below your hips. 3. Lift your left leg behind you. At the same time, raise your right arm and  straighten it in front of you. ? Do not lift your leg higher than your hip. ? Do not lift your arm higher than your shoulder. ? Keep your abdominal and back muscles tight. ? Keep your hips facing the ground. ? Do not arch your back. ? Keep your balance carefully, and do not  hold your breath. 4. Hold this position for __________ seconds. 5. Slowly return to the starting position. 6. Repeat with your right leg and your left arm. Repeat __________ times. Complete this exercise __________ times a day.   Posture and body mechanics Good posture and healthy body mechanics can help to relieve stress in your body's tissues and joints. Body mechanics refers to the movements and positions of your body while you do your daily activities. Posture is part of body mechanics. Good posture means:  Your spine is in its natural S-curve position (neutral).  Your shoulders are pulled back slightly.  Your head is not tipped forward. Follow these guidelines to improve your posture and body mechanics in your everyday activities. Standing  When standing, keep your spine neutral and your feet about hip width apart. Keep a slight bend in your knees. Your ears, shoulders, and hips should line up.  When you do a task in which you stand in one place for a long time, place one foot up on a stable object that is 2-4 inches (5-10 cm) high, such as a footstool. This helps keep your spine neutral.   Sitting  When sitting, keep your spine neutral and keep your feet flat on the floor. Use a footrest, if necessary, and keep your thighs parallel to the floor. Avoid rounding your shoulders, and avoid tilting your head forward.  When working at a desk or a computer, keep your desk at a height where your hands are slightly lower than your elbows. Slide your chair under your desk so you are close enough to maintain good posture.  When working at a computer, place your monitor at a height where you are looking straight ahead and  you do not have to tilt your head forward or downward to look at the screen.   Resting  When lying down and resting, avoid positions that are most painful for you.  If you have pain with activities such as sitting, bending, stooping, or squatting, lie in a position in which your body does not bend very much. For example, avoid curling up on your side with your arms and knees near your chest (fetal position).  If you have pain with activities such as standing for a long time or reaching with your arms, lie with your spine in a neutral position and bend your knees slightly. Try the following positions: ? Lying on your side with a pillow between your knees. ? Lying on your back with a pillow under your knees. Lifting  When lifting objects, keep your feet at least shoulder width apart and tighten your abdominal muscles.  Bend your knees and hips and keep your spine neutral. It is important to lift using the strength of your legs, not your back. Do not lock your knees straight out.  Always ask for help to lift heavy or awkward objects.   This information is not intended to replace advice given to you by your health care provider. Make sure you discuss any questions you have with your health care provider. Document Revised: 09/02/2018 Document Reviewed: 06/02/2018 Elsevier Patient Education  2021 [employer]    High Cholesterol  High cholesterol is a condition in which the blood has high levels of a white, waxy substance similar to fat (cholesterol). The liver makes all the cholesterol that the body needs. The human body needs small amounts of cholesterol to help build cells. A person gets extra or excess cholesterol from the food that he or she  eats. The blood carries cholesterol from the liver to the rest of the body. If you have high cholesterol, deposits (plaques) may build up on the walls of your arteries. Arteries are the blood vessels that carry blood away from your heart. These plaques  make the arteries narrow and stiff. Cholesterol plaques increase your risk for heart attack and stroke. Work with your health care provider to keep your cholesterol levels in a healthy range. What increases the risk? The following factors may make you more likely to develop this condition:  Eating foods that are high in animal fat (saturated fat) or cholesterol.  Being overweight.  Not getting enough exercise.  A family history of high cholesterol (familial hypercholesterolemia).  Use of tobacco products.  Having diabetes. What are the signs or symptoms? There are no symptoms of this condition. How is this diagnosed? This condition may be diagnosed based on the results of a blood test.  If you are older than 47 years of age, your health care provider may check your cholesterol levels every 4-6 years.  You may be checked more often if you have high cholesterol or other risk factors for heart disease. The blood test for cholesterol measures:  "Bad" cholesterol, or LDL cholesterol. This is the main type of cholesterol that causes heart disease. The desired level is less than 100 mg/dL.  "Good" cholesterol, or HDL cholesterol. HDL helps protect against heart disease by cleaning the arteries and carrying the LDL to the liver for processing. The desired level for HDL is 60 mg/dL or higher.  Triglycerides. These are fats that your body can store or burn for energy. The desired level is less than 150 mg/dL.  Total cholesterol. This measures the total amount of cholesterol in your blood and includes LDL, HDL, and triglycerides. The desired level is less than 200 mg/dL. How is this treated? This condition may be treated with:  Diet changes. You may be asked to eat foods that have more fiber and less saturated fats or added sugar.  Lifestyle changes. These may include regular exercise, maintaining a healthy weight, and quitting use of tobacco products.  Medicines. These are given when  diet and lifestyle changes have not worked. You may be prescribed a statin medicine to help lower your cholesterol levels. Follow these instructions at home: Eating and drinking  Eat a healthy, balanced diet. This diet includes: ? Daily servings of a variety of fresh, frozen, or canned fruits and vegetables. ? Daily servings of whole grain foods that are rich in fiber. ? Foods that are low in saturated fats and trans fats. These include poultry and fish without skin, lean cuts of meat, and low-fat dairy products. ? A variety of fish, especially oily fish that contain omega-3 fatty acids. Aim to eat fish at least 2 times a week.  Avoid foods and drinks that have added sugar.  Use healthy cooking methods, such as roasting, grilling, broiling, baking, poaching, steaming, and stir-frying. Do not fry your food except for stir-frying.   Lifestyle  Get regular exercise. Aim to exercise for a total of 150 minutes a week. Increase your activity level by doing activities such as gardening, walking, and taking the stairs.  Do not use any products that contain nicotine or tobacco, such as cigarettes, e-cigarettes, and chewing tobacco. If you need help quitting, ask your health care provider.   General instructions  Take over-the-counter and prescription medicines only as told by your health care provider.  Keep all follow-up visits  as told by your health care provider. This is important. Where to find more information  American Heart Association: www.heart.org  National Heart, Lung, and Blood Institute: [employer]is Contact a health care provider if:  You have trouble achieving or maintaining a healthy diet or weight.  You are starting an exercise program.  You are unable to stop smoking. Get help right away if:  You have chest pain.  You have trouble breathing.  You have any symptoms of a stroke. "BE FAST" is an easy way to remember the main warning signs of a stroke: ? B -  Balance. Signs are dizziness, sudden trouble walking, or loss of balance. ? E - Eyes. Signs are trouble seeing or a sudden change in vision. ? F - Face. Signs are sudden weakness or numbness of the face, or the face or eyelid drooping on one side. ? A - Arms. Signs are weakness or numbness in an arm. This happens suddenly and usually on one side of the body. ? S - Speech. Signs are sudden trouble speaking, slurred speech, or trouble understanding what people say. ? T - Time. Time to call emergency services. Write down what time symptoms started.  You have other signs of a stroke, such as: ? A sudden, severe headache with no known cause. ? Nausea or vomiting. ? Seizure. These symptoms may represent a serious problem that is an emergency. Do not wait to see if the symptoms will go away. Get medical help right away. Call your local emergency services (911 in the [place] Do not drive yourself to the hospital. Summary  Cholesterol plaques increase your risk for heart attack and stroke. Work with your health care provider to keep your cholesterol levels in a healthy range.  Eat a healthy, balanced diet, get regular exercise, and maintain a healthy weight.  Do not use any products that contain nicotine or tobacco, such as cigarettes, e-cigarettes, and chewing tobacco.  Get help right away if you have any symptoms of a stroke. This information is not intended to replace advice given to you by your health care provider. Make sure you discuss any questions you have with your health care provider. Document Revised: 04/10/2019 Document Reviewed: 04/10/2019 Elsevier Patient Education  2021 [employer] Inc.  Cholesterol Content in Foods Cholesterol is a waxy, fat-like substance that helps to carry fat in the blood. The body needs cholesterol in small amounts, but too much cholesterol can cause damage to the arteries and heart. Most people should eat less than 200 milligrams (mg) of cholesterol a  day. Foods with cholesterol Cholesterol is found in animal-based foods, such as meat, seafood, and dairy. Generally, low-fat dairy and lean meats have less cholesterol than full-fat dairy and fatty meats. The milligrams of cholesterol per serving (mg per serving) of common cholesterol-containing foods are listed below. Meat and other proteins  Egg -- one large whole egg has 186 mg.  Veal shank -- 4 oz has 141 mg.  Lean ground [place] (93% lean) -- 4 oz has 118 mg.  Fat-trimmed lamb loin -- 4 oz has 106 mg.  Lean ground beef (90% lean) -- 4 oz has 100 mg.  Lobster -- 3.5 oz has 90 mg.  Pork loin chops -- 4 oz has 86 mg.  Canned salmon -- 3.5 oz has 83 mg.  Fat-trimmed beef top loin -- 4 oz has 78 mg.  Frankfurter -- 1 frank (3.5 oz) has 77 mg.  Crab -- 3.5 oz has 71 mg.  Roasted chicken  without skin, white meat -- 4 oz has 66 mg.  Light bologna -- 2 oz has 45 mg.  Deli-cut [place]key -- 2 oz has 31 mg.  Canned tuna -- 3.5 oz has 31 mg.  Tomasa BlaseBacon -- 1 oz has 29 mg.  Oysters and mussels (raw) -- 3.5 oz has 25 mg.  Mackerel -- 1 oz has 22 mg.  Trout -- 1 oz has 20 mg.  Pork sausage -- 1 link (1 oz) has 17 mg.  Salmon -- 1 oz has 16 mg.  Tilapia -- 1 oz has 14 mg. Dairy  Soft-serve ice cream --  cup (4 oz) has 103 mg.  Whole-milk yogurt -- 1 cup (8 oz) has 29 mg.  Cheddar cheese -- 1 oz has 28 mg.  American cheese -- 1 oz has 28 mg.  Whole milk -- 1 cup (8 oz) has 23 mg.  2% milk -- 1 cup (8 oz) has 18 mg.  Cream cheese -- 1 tablespoon (Tbsp) has 15 mg.  Cottage cheese --  cup (4 oz) has 14 mg.  Low-fat (1%) milk -- 1 cup (8 oz) has 10 mg.  Sour cream -- 1 Tbsp has 8.5 mg.  Low-fat yogurt -- 1 cup (8 oz) has 8 mg.  Nonfat Greek yogurt -- 1 cup (8 oz) has 7 mg.  Half-and-half cream -- 1 Tbsp has 5 mg. Fats and oils  Cod liver oil -- 1 tablespoon (Tbsp) has 82 mg.  Butter -- 1 Tbsp has 15 mg.  Lard -- 1 Tbsp has 14 mg.  Bacon grease -- 1 Tbsp has  14 mg.  Mayonnaise -- 1 Tbsp has 5-10 mg.  Margarine -- 1 Tbsp has 3-10 mg. Exact amounts of cholesterol in these foods may vary depending on specific ingredients and brands.   Foods without cholesterol Most plant-based foods do not have cholesterol unless you combine them with a food that has cholesterol. Foods without cholesterol include:  Grains and cereals.  Vegetables.  Fruits.  Vegetable oils, such as olive, canola, and sunflower oil.  Legumes, such as peas, beans, and lentils.  Nuts and seeds.  Egg whites.   Summary  The body needs cholesterol in small amounts, but too much cholesterol can cause damage to the arteries and heart.  Most people should eat less than 200 milligrams (mg) of cholesterol a day. This information is not intended to replace advice given to you by your health care provider. Make sure you discuss any questions you have with your health care provider. Document Revised: 10/02/2019 Document Reviewed: 10/02/2019 Elsevier Patient Education  2021 [employer]

## 2020-08-29 NOTE — Progress Notes (Addendum)
Chief Complaint  Patient presents with  . Follow-up   F/u  1. Reviewed labs 07/16/20  2. Right lower back pain buttock pain rad right hamstring and back of leg to knee x 1-5 weeks tried tylenol/advil w/o relief massage gun w/o relief has numbness/tingling right leg and feels right hamstring weakness  Worse since hurt right knee  3. Refills of all chronic meds    Review of Systems  Constitutional: Negative for weight loss.  HENT: Negative for hearing loss.   Eyes: Negative for blurred vision.  Respiratory: Negative for shortness of breath.   Cardiovascular: Negative for chest pain.  Gastrointestinal: Negative for abdominal pain.  Musculoskeletal: Positive for back pain. Negative for falls.  Skin: Negative for rash.  Neurological: Negative for headaches.   Past Medical History:  Diagnosis Date  . Arthritis    knee s/p right knee surgery meniscal tear with bone fragments, ankle (ortho Dr. Menz)  . GERD (gastroesophageal reflux disease)   . H. pylori infection   . Herpes infection   . OSA on CPAP   . Sleep apnea 06/2017   PT JUST WAS DX WITH SLEEP APNEA LAST WEEK AND IS WAITING ON HIS CPAP MACHINE   Past Surgical History:  Procedure Laterality Date  . COLONOSCOPY WITH PROPOFOL N/A 04/03/2019   Procedure: COLONOSCOPY WITH PROPOFOL;  Surgeon: Anna, Kiran, MD;  Location: [facility];  Service: Gastroenterology;  Laterality: N/A;  . CYST EXCISION  12/12/2018   Procedure: CYST REMOVAL;  Surgeon: Patel, Sunny, MD;  Location: [facility];  Service: Orthopedics;;  . ESOPHAGOGASTRODUODENOSCOPY (EGD) WITH PROPOFOL N/A 01/26/2018   Procedure: ESOPHAGOGASTRODUODENOSCOPY (EGD) WITH PROPOFOL;  Surgeon: Anna, Kiran, MD;  Location: [facility];  Service: Gastroenterology;  Laterality: N/A;  . KIDNEY DONATION Left    2004  . KNEE ARTHROSCOPY WITH MEDIAL MENISECTOMY Right 07/15/2017   Procedure: KNEE ARTHROSCOPY WITH MEDIAL MENISECTOMY;  Surgeon: Menz, Michael, MD;  Location: [facility];  Service:  Orthopedics;  Laterality: Right;  . KNEE ARTHROSCOPY WITH MEDIAL MENISECTOMY Right 12/12/2018   Procedure: KNEE ARTHROSCOPY WITH PARTIAL MEDIAL MENISECTOMY;  Surgeon: Patel, Sunny, MD;  Location: [facility];  Service: Orthopedics;  Laterality: Right;  . meniscal tear     right knee Dr. Menz 2014   . MENISECTOMY Right 07/15/2017   Procedure: MENISECTOMY;  Surgeon: Menz, Michael, MD;  Location: [facility];  Service: Orthopedics;  Laterality: Right;  . ROBOTIC ASSISTED LAPAROSCOPIC NISSEN FUNDOPLICATION N/A 03/03/2018   Procedure: ROBOTIC ASSISTED LAPAROSCOPIC NISSEN FUNDOPLICATION WITH HIATAL HERNIA REPAIR;  Surgeon: Pabon, Diego F, MD;  Location: [facility];  Service: General;  Laterality: N/A;  . UMBILICAL HERNIA REPAIR N/A 03/03/2018   Procedure: HERNIA REPAIR UMBILICAL ADULT;  Surgeon: Pabon, Diego F, MD;  Location: [facility];  Service: General;  Laterality: N/A;   Family History  Problem Relation Age of Onset  . Drug abuse Mother   . Hyperlipidemia Mother   . Hypertension Mother   . Cancer Maternal Aunt        brain, breast, lung ? primary was smoker   . Prostate cancer Neg Hx   . Bladder Cancer Neg Hx   . Kidney cancer Neg Hx    Social History   Socioeconomic History  . Marital status: Divorced    Spouse name: Not on file  . Number of children: Not on file  . Years of education: Not on file  . Highest education level: Not on file  Occupational History  . Not on file  Tobacco Use  .   Smoking status: Never Smoker  . Smokeless tobacco: Never Used  . Tobacco comment: light in the past social smoker quit  Vaping Use  . Vaping Use: Never used  Substance and Sexual Activity  . Alcohol use: Yes    Alcohol/week: 1.0 - 2.0 standard drink    Types: 1 - 2 Shots of liquor per week  . Drug use: No  . Sexual activity: Yes  Other Topics Concern  . 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 cyclobenzaprine (FLEXERIL) 5 MG tablet Take 1-2 tablets (5-10 mg total) by mouth at bedtime as needed for muscle spasms.  . traMADol (ULTRAM) 50 MG tablet Take 1 tablet (50 mg total) by mouth 2 (two) times daily as needed for up to 5 days.  . [DISCONTINUED] ALPRAZolam (XANAX) 0.25 MG tablet Take 1 tablet (0.25 mg total) by mouth at bedtime as needed for anxiety.  . [DISCONTINUED] buPROPion (WELLBUTRIN XL) 300 MG 24 hr tablet Take 1 tablet (300 mg total) by mouth daily.  . [DISCONTINUED] Olopatadine HCl 0.2 % SOLN Place 1 drop into both eyes daily as needed (runny eyes).  . [DISCONTINUED] tadalafil (CIALIS) 10 MG tablet Take 1 tablet (10 mg total) by mouth daily as needed for erectile dysfunction.  . [DISCONTINUED] valACYclovir (VALTREX) 500 MG tablet Take 1 tablet (500 mg total) by mouth See admin instructions. Take 500 mg daily for prevention. With outbreak take 500 mg twice daily for 3 to 7 days   Current Facility-Administered Medications for the 08/29/20 encounter (Office Visit) with McLean-Scocuzza, Nino Glow, MD  Medication  . methylPREDNISolone acetate (DEPO-MEDROL) injection 80 mg   No Known Allergies Recent Results (from the past 2160 hour(s))  Testosterone     Status: None   Collection Time: 07/16/20  8:20 AM  Result Value Ref Range   Testosterone 340.11 300.00 - 890.00 ng/dL  PSA     Status: None   Collection Time: 07/16/20  8:20 AM  Result Value Ref Range   PSA 0.48 0.10 - 4.00 ng/mL    Comment: Test performed using Access Hybritech PSA Assay, a parmagnetic partical, chemiluminecent immunoassay.  Hepatitis B surface  antibody,quantitative     Status: None   Collection Time: 07/16/20  8:20 AM  Result Value Ref Range   Hepatitis B-Post 606 > OR = 10 mIU/mL    Comment: . Patient has immunity to hepatitis B virus. . For additional information, please refer to http://education.questdiagn[facility].com/faq/FAQ105 (This link is being provided for informational/ educational purposes only).   Vitamin D (25 hydroxy)     Status: Abnormal   Collection Time: 07/16/20  8:20 AM  Result Value Ref Range   VITD 28.67 (L) 30.00 - 100.00 ng/mL  Urinalysis, Routine w reflex microscopic     Status: None   Collection Time: 07/16/20  8:20 AM  Result Value Ref Range   Color, Urine YELLOW YELLOW   APPearance CLEAR CLEAR   Specific Gravity, Urine 1.016 1.001 - 1.03   pH 6.0 5.0 -  8.0   Glucose, UA NEGATIVE NEGATIVE   Bilirubin Urine NEGATIVE NEGATIVE   Ketones, ur NEGATIVE NEGATIVE   Hgb urine dipstick NEGATIVE NEGATIVE   Protein, ur NEGATIVE NEGATIVE   Nitrite NEGATIVE NEGATIVE   Leukocytes,Ua NEGATIVE NEGATIVE  TSH     Status: None   Collection Time: 07/16/20  8:20 AM  Result Value Ref Range   TSH 2.17 0.35 - 4.50 uIU/mL  CBC with Differential/Platelet     Status: Abnormal   Collection Time: 07/16/20  8:20 AM  Result Value Ref Range   WBC 5.0 4.0 - 10.5 K/uL   RBC 5.17 4.22 - 5.81 Mil/uL   Hemoglobin 14.9 13.0 - 17.0 g/dL   HCT 44.5 39.0 - 52.0 %   MCV 86.2 78.0 - 100.0 fl   MCHC 33.5 30.0 - 36.0 g/dL   RDW 14.4 11.5 - 15.5 %   Platelets 203.0 150.0 - 400.0 K/uL   Neutrophils Relative % 48.6 43.0 - 77.0 %   Lymphocytes Relative 32.6 12.0 - 46.0 %   Monocytes Relative 10.9 3.0 - 12.0 %   Eosinophils Relative 7.3 (H) 0.0 - 5.0 %   Basophils Relative 0.6 0.0 - 3.0 %   Neutro Abs 2.4 1.4 - 7.7 K/uL   Lymphs Abs 1.6 0.7 - 4.0 K/uL   Monocytes Absolute 0.5 0.1 - 1.0 K/uL   Eosinophils Absolute 0.4 0.0 - 0.7 K/uL   Basophils Absolute 0.0 0.0 - 0.1 K/uL  Lipid panel     Status: Abnormal   Collection Time:  07/16/20  8:20 AM  Result Value Ref Range   Cholesterol 208 (H) 0 - 200 mg/dL    Comment: ATP III Classification       Desirable:  < 200 mg/dL               Borderline High:  200 - 239 mg/dL          High:  > = 240 mg/dL   Triglycerides 153.0 (H) 0.0 - 149.0 mg/dL    Comment: Normal:  <150 mg/dLBorderline High:  150 - 199 mg/dL   HDL 48.70 >39.00 mg/dL   VLDL 30.6 0.0 - 40.0 mg/dL   LDL Cholesterol 129 (H) 0 - 99 mg/dL   Total CHOL/HDL Ratio 4     Comment:                Men          Women1/2 Average Risk     3.4          3.3Average Risk          5.0          4.42X Average Risk          9.6          7.13X Average Risk          15.0          11.0                       NonHDL 159.38     Comment: NOTE:  Non-HDL goal should be 30 mg/dL higher than patient's LDL goal (i.e. LDL goal of < 70 mg/dL, would have non-HDL goal of < 100 mg/dL)  Comprehensive metabolic panel     Status: None   Collection Time: 07/16/20  8:20 AM  Result Value Ref Range   Sodium 139 135 - 145 mEq/L   Potassium 4.7 3.5 - 5.1 mEq/L  Chloride 103 96 - 112 mEq/L   CO2 30 19 - 32 mEq/L   Glucose, Bld 96 70 - 99 mg/dL   BUN 14 6 - 23 mg/dL   Creatinine, Ser 1.32 0.40 - 1.50 mg/dL   Total Bilirubin 0.6 0.2 - 1.2 mg/dL   Alkaline Phosphatase 64 39 - 117 U/L   AST 18 0 - 37 U/L   ALT 19 0 - 53 U/L   Total Protein 7.0 6.0 - 8.3 g/dL   Albumin 4.1 3.5 - 5.2 g/dL   GFR 64.77 >60.00 mL/min    Comment: Calculated using the CKD-EPI Creatinine Equation (2021)   Calcium 9.8 8.4 - 10.5 mg/dL   Objective  Body mass index is 36.97 kg/m. Wt Readings from Last 3 Encounters:  08/29/20 272 lb 9.6 oz (123.7 kg)  02/29/20 267 lb (121.1 kg)  02/08/20 267 lb 3.2 oz (121.2 kg)   Temp Readings from Last 3 Encounters:  08/29/20 98.2 F (36.8 C) (Oral)  02/29/20 98.2 F (36.8 C) (Oral)  02/08/20 98.2 F (36.8 C)   BP Readings from Last 3 Encounters:  08/29/20 112/80  02/29/20 128/78  02/08/20 135/81   Pulse Readings from Last  3 Encounters:  08/29/20 84  02/29/20 97  02/08/20 71    Physical Exam Vitals and nursing note reviewed.  Constitutional:      Appearance: Normal appearance. He is well-developed and well-groomed. He is obese.  HENT:     Head: Normocephalic and atraumatic.  Cardiovascular:     Rate and Rhythm: Normal rate and regular rhythm.     Heart sounds: Normal heart sounds. No murmur heard.   Pulmonary:     Effort: Pulmonary effort is normal.     Breath sounds: Normal breath sounds.  Abdominal:     Tenderness: There is no abdominal tenderness.  Skin:    General: Skin is warm and dry.  Neurological:     General: No focal deficit present.     Mental Status: He is alert and oriented to person, place, and time. Mental status is at baseline.     Gait: Gait normal.  Psychiatric:        Attention and Perception: Attention and perception normal.        Mood and Affect: Mood and affect normal.        Speech: Speech normal.        Behavior: Behavior normal. Behavior is cooperative.        Thought Content: Thought content normal.        Cognition and Memory: Cognition and memory normal.        Judgment: Judgment normal.     Assessment  Plan  Right sided sciatica - Plan: methylPREDNISolone acetate (DEPO-MEDROL) injection 80 mg, cyclobenzaprine (FLEXERIL) 5 MG tablet, traMADol (ULTRAM) 50 MG tablet Consider Xray low back/MRI/PT in future [place] [facility] [place] close to [place] job but [place]  Anxiety and depression - Plan: buPROPion (WELLBUTRIN XL) 300 MG 24 hr tablet, ALPRAZolam (XANAX) 0.25 MG tablet  Watery eyes - Plan: Olopatadine HCl 0.2 % SOLN  Herpes infection - Plan: valACYclovir (VALTREX) 500 MG tablet   HM Flu shotutd UTD Tdap MMR immune hep Bhad 3/3 vaccines consider new hep b vx and immune   covid 2/2disc booster had 3rd dose ~03/2021   Stdutd  Colonoscopy11/2020 neg rec healthy diet and  exercise  Disc the next 56   days  PSAnl 06/27/19 -DRE in futureage 48 D3 5000 IU and mvt rec Fasting labs 06/2020 Form filled out for work   [employer] TB - Plan: Ronney Asters Plus Provider: Dr. Olivia Mackie McLean-Scocuzza-Internal Medicine

## 2020-09-09 ENCOUNTER — Encounter: Payer: Self-pay | Admitting: Internal Medicine

## 2020-09-09 NOTE — Addendum Note (Signed)
Addended by: Quentin Ore on: 09/09/2020 09:37 PM   Modules accepted: Orders

## 2020-09-11 ENCOUNTER — Other Ambulatory Visit: Payer: Self-pay | Admitting: Internal Medicine

## 2020-09-11 DIAGNOSIS — M5431 Sciatica, right side: Secondary | ICD-10-CM

## 2020-09-11 MED ORDER — TRAMADOL HCL 50 MG PO TABS: 50.0000 mg | ORAL_TABLET | Freq: Two times a day (BID) | ORAL | 0 refills | Status: AC | PRN

## 2021-01-01 ENCOUNTER — Other Ambulatory Visit: Payer: Self-pay

## 2021-01-01 ENCOUNTER — Encounter: Payer: Self-pay | Admitting: Internal Medicine

## 2021-01-01 ENCOUNTER — Ambulatory Visit: Payer: BC Managed Care – PPO | Admitting: Internal Medicine

## 2021-01-01 VITALS — BP 122/86 | HR 76 | Temp 96.8°F | Ht 72.0 in | Wt 266.8 lb

## 2021-01-01 DIAGNOSIS — M25561 Pain in right knee: Secondary | ICD-10-CM

## 2021-01-01 DIAGNOSIS — Z1389 Encounter for screening for other disorder: Secondary | ICD-10-CM

## 2021-01-01 DIAGNOSIS — Z125 Encounter for screening for malignant neoplasm of prostate: Secondary | ICD-10-CM

## 2021-01-01 DIAGNOSIS — Z1329 Encounter for screening for other suspected endocrine disorder: Secondary | ICD-10-CM

## 2021-01-01 DIAGNOSIS — E559 Vitamin D deficiency, unspecified: Secondary | ICD-10-CM

## 2021-01-01 DIAGNOSIS — G8929 Other chronic pain: Secondary | ICD-10-CM

## 2021-01-01 DIAGNOSIS — H04203 Unspecified epiphora, bilateral lacrimal glands: Secondary | ICD-10-CM

## 2021-01-01 DIAGNOSIS — E785 Hyperlipidemia, unspecified: Secondary | ICD-10-CM | POA: Diagnosis not present

## 2021-01-01 DIAGNOSIS — F419 Anxiety disorder, unspecified: Secondary | ICD-10-CM

## 2021-01-01 DIAGNOSIS — M545 Low back pain, unspecified: Secondary | ICD-10-CM

## 2021-01-01 DIAGNOSIS — Z0000 Encounter for general adult medical examination without abnormal findings: Secondary | ICD-10-CM

## 2021-01-01 DIAGNOSIS — F32A Depression, unspecified: Secondary | ICD-10-CM

## 2021-01-01 MED ORDER — ALPRAZOLAM 0.25 MG PO TABS: 0.2500 mg | ORAL_TABLET | Freq: Every evening | ORAL | 5 refills | Status: DC | PRN

## 2021-01-01 MED ORDER — OLOPATADINE HCL 0.2 % OP SOLN: 1.0000 [drp] | Freq: Every day | OPHTHALMIC | 11 refills | Status: AC | PRN

## 2021-01-01 NOTE — Patient Instructions (Addendum)
No referral needed  [facility] [address]  [place] [address] [phone]   [facility] [place] [address] [place] [address] [phone]   SEL-referral needed in [place]  Located in: [place] office center Address: [address] [place], [place], [place] [address] Hours:  Open ? Closes 8PM Phone: [phone]  Dr. Charlann Boxer in [place]

## 2021-01-01 NOTE — Progress Notes (Signed)
Chief Complaint  Patient presents with   Follow-up   F/u  1. Gad 7 14 and phq 9 score 13 on wellbutrin xl 300 mg qd and xanax 0.25 mg qd prn stress from work  2. Chronic right knee pain s/p 2 arthroscopy procedures    Review of Systems  Constitutional:  Negative for weight loss.  HENT:  Negative for hearing loss.   Eyes:  Negative for blurred vision.  Respiratory:  Negative for shortness of breath.   Cardiovascular:  Negative for chest pain.  Gastrointestinal:  Negative for heartburn.  Musculoskeletal:  Positive for joint pain.  Skin:  Negative for rash.  Neurological:  Negative for dizziness and headaches.  Psychiatric/Behavioral:  Negative for depression.   Past Medical History:  Diagnosis Date   Arthritis    knee s/p right knee surgery meniscal tear with bone fragments, ankle (ortho Dr. Rudene Christians)   COVID-19    11/2020   GERD (gastroesophageal reflux disease)    H. pylori infection    Herpes infection    OSA on CPAP    Sleep apnea 06/2017   PT JUST WAS DX WITH SLEEP APNEA LAST WEEK AND IS WAITING ON HIS CPAP MACHINE   Past Surgical History:  Procedure Laterality Date   COLONOSCOPY WITH PROPOFOL N/A 04/03/2019   Procedure: COLONOSCOPY WITH PROPOFOL;  Surgeon: [name], MD;  Location: [facility] END[facility];  Service: Gastroenterology;  Laterality: N/A;   CYST EXCISION  12/12/2018   Procedure: CYST REMOVAL;  Surgeon: Leim Fabry, MD;  Location: [facility];  Service: Orthopedics;;   ESOPHAGOGASTRODUODENOSCOPY (EGD) WITH PROPOFOL N/A 01/26/2018   Procedure: ESOPHAGOGASTRODUODENOSCOPY (EGD) WITH PROPOFOL;  Surgeon: [name], MD;  Location: [facility] ENDOSCOPY;  Service: Gastroenterology;  Laterality: N/A;   KIDNEY DONATION Left    2004   KNEE ARTHROSCOPY WITH MEDIAL MENISECTOMY Right 07/15/2017   Procedure: KNEE ARTHROSCOPY WITH MEDIAL MENISECTOMY;  Surgeon: Hessie Knows, MD;  Location: [facility];  Service: Orthopedics;  Laterality: Right;   KNEE ARTHROSCOPY WITH MEDIAL MENISECTOMY Right  12/12/2018   Procedure: KNEE ARTHROSCOPY WITH PARTIAL MEDIAL MENISECTOMY;  Surgeon: Leim Fabry, MD;  Location: [facility];  Service: Orthopedics;  Laterality: Right;   meniscal tear     right knee Dr. Rudene Christians 2014    MENISECTOMY Right 07/15/2017   Procedure: MENISECTOMY;  Surgeon: Hessie Knows, MD;  Location: [facility];  Service: Orthopedics;  Laterality: Right;   ROBOTIC ASSISTED LAPAROSCOPIC NISSEN FUNDOPLICATION N/A 51/76/1607   Procedure: ROBOTIC ASSISTED LAPAROSCOPIC NISSEN FUNDOPLICATION WITH HIATAL HERNIA REPAIR;  Surgeon: Jules Husbands, MD;  Location: [facility];  Service: General;  Laterality: N/A;   UMBILICAL HERNIA REPAIR N/A 03/03/2018   Procedure: HERNIA REPAIR UMBILICAL ADULT;  Surgeon: Jules Husbands, MD;  Location: [facility];  Service: General;  Laterality: N/A;   Family History  Problem Relation Age of Onset   Drug abuse Mother    Hyperlipidemia Mother    Hypertension Mother    Cancer Maternal Aunt        brain, breast, lung ? primary was smoker    Prostate cancer Neg Hx    Bladder Cancer Neg Hx    Kidney cancer Neg Hx    Social History   Socioeconomic History   Marital status: Divorced    Spouse name: Not on file   Number of children: Not on file   Years of education: Not on file   Highest education level: Not on file  Occupational History   Not on file  Tobacco Use  Smoking status: Never   Smokeless tobacco: Never   Tobacco comments:    light in the past social smoker quit  Vaping Use   Vaping Use: Never used  Substance and Sexual Activity   Alcohol use: Yes    Alcohol/week: 1.0 - 2.0 standard drink    Types: 1 - 2 Shots of liquor per week   Drug use: No   Sexual activity: Yes  Other Topics Concern   Not on file  Social History Narrative   2 year college    Works Comptroller, coaches girls basketball, football, 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   tadalafil (CIALIS) 10 MG tablet Take 1 tablet (10 mg total) by mouth daily as needed for erectile dysfunction.   valACYclovir (VALTREX) 500 MG tablet Take 1 tablet (500 mg total) by mouth See admin instructions. Take 500 mg daily for prevention. With outbreak take 500 mg twice daily for 3 to 7 days   [DISCONTINUED] ALPRAZolam (XANAX) 0.25 MG tablet Take 1 tablet (0.25 mg total) by mouth at bedtime as needed for anxiety.   [DISCONTINUED] Olopatadine HCl 0.2 % SOLN Place 1 drop into both eyes daily as needed (runny eyes).   No Known Allergies No results found for this or any previous visit (from the past 2160 hour(s)). Objective  Body mass index is 36.18 kg/m. Wt Readings from Last 3 Encounters:  01/01/21 266 lb 12.8 oz (121 kg)  08/29/20 272 lb 9.6 oz (123.7 kg)  02/29/20 267 lb (121.1 kg)   Temp Readings from Last 3 Encounters:  01/01/21 (!) 96.8 F (36 C) (Temporal)  08/29/20 98.2 F (36.8 C) (Oral)  02/29/20 98.2 F (36.8 C) (Oral)   BP Readings from Last 3 Encounters:  01/01/21 122/86  08/29/20 112/80  02/29/20 128/78   Pulse Readings from Last 3 Encounters:  01/01/21 76  08/29/20 84  02/29/20 97    Physical Exam Vitals and nursing note reviewed.  Constitutional:      Appearance: Normal appearance. He is well-developed and well-groomed. He is obese.  HENT:     Head: Normocephalic and atraumatic.  Eyes:     Conjunctiva/sclera: Conjunctivae normal.     Pupils: Pupils are equal, round, and reactive to light.  Cardiovascular:     Rate and Rhythm: Normal rate and regular rhythm.     Heart sounds: Normal heart sounds. No murmur heard. Pulmonary:      Effort: Pulmonary effort is normal.     Breath sounds: Normal breath sounds.  Skin:    General: Skin is warm and dry.  Neurological:     General: No focal deficit present.     Mental Status: He is alert and oriented to person, place, and time. Mental status is at baseline.     Gait: Gait normal.  Psychiatric:        Attention and Perception: Attention and perception normal.        Mood and Affect: Mood and affect normal.  Speech: Speech normal.        Behavior: Behavior normal. Behavior is cooperative.        Thought Content: Thought content normal.        Cognition and Memory: Cognition and memory normal.        Judgment: Judgment normal.    Assessment  Plan  Anxiety and depression - Plan: ALPRAZolam (XANAX) 0.25 MG tablet wellbutrin 300 xl helpin   Chronic right-sided low back pain, unspecified whether sciatica present Better after PT   Chronic pain of right knee - Plan: Ambulatory referral to Orthopedic Surgery Dr. Alvan Dame s/p 2 arthroscopy  Watery eyes - Plan: Olopatadine HCl 0.2 % SOLN   HM Flu shot utd UTD Tdap  MMR immune hep B had 3/3 vaccines consider new hep b vx and immune    covid 3/3 disc booster had 3rd dose ~03/2021 consider 4th dose 02/2021 had covid 11/2020    Std utd   Colonoscopy 03/2019 neg f/u in 10 years PSA nl 06/27/19 -DRE in future age 64 D3 5000 IU and mvt rec Fasting labs 06/2021 rec healthy diet and exercise Provider: Dr. Olivia Mackie McLean-Scocuzza-Internal Medicine

## 2021-02-06 ENCOUNTER — Encounter: Payer: Self-pay | Admitting: Urology

## 2021-02-06 ENCOUNTER — Ambulatory Visit (INDEPENDENT_AMBULATORY_CARE_PROVIDER_SITE_OTHER): Payer: BC Managed Care – PPO | Admitting: Urology

## 2021-02-06 ENCOUNTER — Other Ambulatory Visit: Payer: Self-pay

## 2021-02-06 VITALS — BP 130/84 | HR 76 | Ht 72.0 in | Wt 265.0 lb

## 2021-02-06 DIAGNOSIS — N3281 Overactive bladder: Secondary | ICD-10-CM

## 2021-02-06 DIAGNOSIS — N529 Male erectile dysfunction, unspecified: Secondary | ICD-10-CM

## 2021-02-06 DIAGNOSIS — N138 Other obstructive and reflux uropathy: Secondary | ICD-10-CM

## 2021-02-06 DIAGNOSIS — N401 Enlarged prostate with lower urinary tract symptoms: Secondary | ICD-10-CM

## 2021-02-06 DIAGNOSIS — Z125 Encounter for screening for malignant neoplasm of prostate: Secondary | ICD-10-CM

## 2021-02-06 LAB — BLADDER SCAN AMB NON-IMAGING

## 2021-02-06 MED ORDER — TADALAFIL 10 MG PO TABS: 10.0000 mg | ORAL_TABLET | Freq: Every day | ORAL | 6 refills | Status: DC | PRN

## 2021-02-06 NOTE — Progress Notes (Signed)
   02/06/2021 9:13 AM   Joshua Ramirez [birth year] [id number]  Reason for visit: Follow up ED, PSA screening, urinary symptoms, solitary right kidney  HPI: 45 year old male we are following for a number of urologic issues.  He denies any major changes in his health over the last year.  He is on 10-20 mg Cialis for ED with good results.  Recent testosterone was normal at 340.  He denies a family history of prostate cancer.  Most recent PSA is normal at 0.48 in February 2022 which is stable from 0.59 one-year ago.  We reviewed the AUA guidelines that would recommend screening every 1 to 2 years.  Finally, he has some mild urinary symptoms of urinary frequency and weak stream.  He is drinking a fair amount of soda still during the day which likely contributes to his urinary symptoms.  PVR is normal today at 71 mL.  His urinary symptoms are not bothersome enough to consider medications at this time.  Personally reviewed the CT abdomen pelvis from 2019 that shows a prostate that measures only 23 g, no hydronephrosis, solitary right kidney.  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  Renal function is stable and normal with creatinine of 1.3 from 1.45 previously, and EGFR greater than 60.  Continue Cialis for ED, refilled RTC 1 year symptom check   Billey Co, MD  [facility] [address] [place], [place] [address] [phone]

## 2021-02-07 ENCOUNTER — Encounter: Payer: Self-pay | Admitting: Internal Medicine

## 2021-03-24 ENCOUNTER — Encounter: Payer: Self-pay | Admitting: Internal Medicine

## 2021-04-04 ENCOUNTER — Ambulatory Visit (INDEPENDENT_AMBULATORY_CARE_PROVIDER_SITE_OTHER): Payer: BC Managed Care – PPO | Admitting: Internal Medicine

## 2021-04-04 ENCOUNTER — Encounter: Payer: Self-pay | Admitting: Internal Medicine

## 2021-04-04 ENCOUNTER — Other Ambulatory Visit: Payer: Self-pay

## 2021-04-04 VITALS — BP 126/80 | HR 73 | Temp 97.1°F | Ht 72.0 in | Wt 272.8 lb

## 2021-04-04 DIAGNOSIS — Z23 Encounter for immunization: Secondary | ICD-10-CM

## 2021-04-04 DIAGNOSIS — Z01818 Encounter for other preprocedural examination: Secondary | ICD-10-CM | POA: Diagnosis not present

## 2021-04-04 LAB — CBC WITH DIFFERENTIAL/PLATELET
Basophils Absolute: 0 10*3/uL (ref 0.0–0.1)
Basophils Relative: 0.9 % (ref 0.0–3.0)
Eosinophils Absolute: 0.4 10*3/uL (ref 0.0–0.7)
Eosinophils Relative: 7.3 % — ABNORMAL HIGH (ref 0.0–5.0)
HCT: 45.1 % (ref 39.0–52.0)
Hemoglobin: 14.8 g/dL (ref 13.0–17.0)
Lymphocytes Relative: 32 % (ref 12.0–46.0)
Lymphs Abs: 1.6 10*3/uL (ref 0.7–4.0)
MCHC: 32.7 g/dL (ref 30.0–36.0)
MCV: 85.4 fl (ref 78.0–100.0)
Monocytes Absolute: 0.6 10*3/uL (ref 0.1–1.0)
Monocytes Relative: 11.6 % (ref 3.0–12.0)
Neutro Abs: 2.4 10*3/uL (ref 1.4–7.7)
Neutrophils Relative %: 48.2 % (ref 43.0–77.0)
Platelets: 207 10*3/uL (ref 150.0–400.0)
RBC: 5.28 Mil/uL (ref 4.22–5.81)
RDW: 14.2 % (ref 11.5–15.5)
WBC: 5 10*3/uL (ref 4.0–10.5)

## 2021-04-04 LAB — URINALYSIS, ROUTINE W REFLEX MICROSCOPIC
Bilirubin Urine: NEGATIVE
Hgb urine dipstick: NEGATIVE
Ketones, ur: NEGATIVE
Leukocytes,Ua: NEGATIVE
Nitrite: NEGATIVE
Specific Gravity, Urine: 1.015 (ref 1.000–1.030)
Total Protein, Urine: NEGATIVE
Urine Glucose: NEGATIVE
Urobilinogen, UA: 1 (ref 0.0–1.0)
pH: 6.5 (ref 5.0–8.0)

## 2021-04-04 LAB — COMPREHENSIVE METABOLIC PANEL
ALT: 29 U/L (ref 0–53)
AST: 22 U/L (ref 0–37)
Albumin: 4.3 g/dL (ref 3.5–5.2)
Alkaline Phosphatase: 73 U/L (ref 39–117)
BUN: 17 mg/dL (ref 6–23)
CO2: 27 mEq/L (ref 19–32)
Calcium: 9.6 mg/dL (ref 8.4–10.5)
Chloride: 102 mEq/L (ref 96–112)
Creatinine, Ser: 1.33 mg/dL (ref 0.40–1.50)
GFR: 63.86 mL/min (ref >60.00)
Glucose, Bld: 97 mg/dL (ref 70–99)
Potassium: 4.4 mEq/L (ref 3.5–5.1)
Sodium: 137 mEq/L (ref 135–145)
Total Bilirubin: 0.8 mg/dL (ref 0.2–1.2)
Total Protein: 7.1 g/dL (ref 6.0–8.3)

## 2021-04-04 LAB — PROTIME-INR
INR: 1 ratio (ref 0.8–1.0)
Prothrombin Time: 11.2 s (ref 9.6–13.1)

## 2021-04-04 NOTE — Progress Notes (Signed)
Chief Complaint  Patient presents with   screening    Pre op clearance right knee partial UKA labs today surgery 05/12/21 Dr. Alvan Dame pain 7/10  Flu shot today   Review of Systems  Constitutional:  Negative for weight loss.  HENT:  Negative for hearing loss.   Eyes:  Negative for blurred vision.  Respiratory:  Negative for shortness of breath.   Cardiovascular:  Negative for chest pain.  Gastrointestinal:  Negative for abdominal pain and blood in stool.  Genitourinary:  Negative for dysuria.  Musculoskeletal:  Positive for joint pain. Negative for back pain and falls.  Skin:  Negative for rash.  Neurological:  Negative for headaches.  Psychiatric/Behavioral:  Negative for depression.   Past Medical History:  Diagnosis Date   Arthritis    knee s/p right knee surgery meniscal tear with bone fragments, ankle (ortho Dr. Rudene Christians)   COVID-19    11/2020   GERD (gastroesophageal reflux disease)    H. pylori infection    Herpes infection    OSA on CPAP    Sleep apnea 06/2017   PT JUST WAS DX WITH SLEEP APNEA LAST WEEK AND IS WAITING ON HIS CPAP MACHINE   Past Surgical History:  Procedure Laterality Date   COLONOSCOPY WITH PROPOFOL N/A 04/03/2019   Procedure: COLONOSCOPY WITH PROPOFOL;  Surgeon: [name], MD;  Location: [facility] ENDOSCOPY;  Service: Gastroenterology;  Laterality: N/A;   CYST EXCISION  12/12/2018   Procedure: CYST REMOVAL;  Surgeon: Leim Fabry, MD;  Location: [facility] ORS;  Service: Orthopedics;;   ESOPHAGOGASTRODUODENOSCOPY (EGD) WITH PROPOFOL N/A 01/26/2018   Procedure: ESOPHAGOGASTRODUODENOSCOPY (EGD) WITH PROPOFOL;  Surgeon: [name], MD;  Location: [facility] ENDOSCOPY;  Service: Gastroenterology;  Laterality: N/A;   KIDNEY DONATION Left    2004   KNEE ARTHROSCOPY WITH MEDIAL MENISECTOMY Right 07/15/2017   Procedure: KNEE ARTHROSCOPY WITH MEDIAL MENISECTOMY;  Surgeon: Hessie Knows, MD;  Location: [facility] ORS;  Service: Orthopedics;  Laterality: Right;   KNEE ARTHROSCOPY WITH  MEDIAL MENISECTOMY Right 12/12/2018   Procedure: KNEE ARTHROSCOPY WITH PARTIAL MEDIAL MENISECTOMY;  Surgeon: Leim Fabry, MD;  Location: [facility] ORS;  Service: Orthopedics;  Laterality: Right;   meniscal tear     right knee Dr. Rudene Christians 2014    MENISECTOMY Right 07/15/2017   Procedure: MENISECTOMY;  Surgeon: Hessie Knows, MD;  Location: [facility] ORS;  Service: Orthopedics;  Laterality: Right;   ROBOTIC ASSISTED LAPAROSCOPIC NISSEN FUNDOPLICATION N/A 58/01/9832   Procedure: ROBOTIC ASSISTED LAPAROSCOPIC NISSEN FUNDOPLICATION WITH HIATAL HERNIA REPAIR;  Surgeon: Jules Husbands, MD;  Location: [facility] ORS;  Service: General;  Laterality: N/A;   UMBILICAL HERNIA REPAIR N/A 03/03/2018   Procedure: HERNIA REPAIR UMBILICAL ADULT;  Surgeon: Jules Husbands, MD;  Location: [facility] ORS;  Service: General;  Laterality: N/A;   Family History  Problem Relation Age of Onset   Drug abuse Mother    Hyperlipidemia Mother    Hypertension Mother    Cancer Maternal Aunt        brain, breast, lung ? primary was smoker    Prostate cancer Neg Hx    Bladder Cancer Neg Hx    Kidney cancer Neg Hx    Social History   Socioeconomic History   Marital status: Divorced    Spouse name: Not on file   Number of children: Not on file   Years of education: Not on file   Highest education level: Not on file  Occupational History   Not on file  Tobacco Use   Smoking  status: Never   Smokeless tobacco: Never   Tobacco comments:    light in the past social smoker quit  Vaping Use   Vaping Use: Never used  Substance and Sexual Activity   Alcohol use: Yes    Alcohol/week: 1.0 - 2.0 standard drink    Types: 1 - 2 Shots of liquor per week   Drug use: No   Sexual activity: Yes  Other Topics Concern   Not on file  Social History Narrative   2 year college    Works Comptroller, coaches girls basketball, football, 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   buPROPion (WELLBUTRIN XL) 300 MG 24 hr tablet Take 1 tablet (300 mg total) by mouth daily.   Olopatadine HCl 0.2 % SOLN Place 1 drop into both eyes daily as needed (runny eyes).   tadalafil (CIALIS) 10 MG tablet Take 1 tablet (10 mg total) by mouth daily as needed for erectile dysfunction.   valACYclovir (VALTREX) 500 MG tablet Take 1 tablet (500 mg total) by mouth See admin instructions. Take 500 mg daily for prevention. With outbreak take 500 mg twice daily for 3 to 7 days   No Known Allergies Recent Results (from the past 2160 hour(s))  BLADDER SCAN AMB NON-IMAGING     Status: None   Collection Time: 02/06/21  9:18 AM  Result Value Ref Range   Scan Result 70m    Objective  Body mass index is 37 kg/m. Wt Readings from Last 3 Encounters:  04/04/21 272 lb 12.8 oz (123.7 kg)  02/06/21 265 lb (120.2 kg)  01/01/21 266 lb 12.8 oz (121 kg)   Temp Readings from Last 3 Encounters:  04/04/21 (!) 97.1 F (36.2 C) (Temporal)  01/01/21 (!) 96.8 F (36 C) (Temporal)  08/29/20 98.2 F (36.8 C) (Oral)   BP Readings from Last 3 Encounters:  04/04/21 126/80  02/06/21 130/84  01/01/21 122/86   Pulse Readings from Last 3 Encounters:  04/04/21 73  02/06/21 76  01/01/21 76    Physical Exam Vitals and nursing note reviewed.  Constitutional:      Appearance: Normal appearance. He is well-developed and well-groomed. He is obese.  HENT:     Head: Normocephalic and atraumatic.  Eyes:     Conjunctiva/sclera: Conjunctivae normal.     Pupils: Pupils are equal, round, and reactive to light.  Cardiovascular:     Rate and Rhythm:  Normal rate and regular rhythm.     Heart sounds: Normal heart sounds.  Pulmonary:     Effort: Pulmonary effort is normal. No respiratory distress.     Breath sounds: Normal breath sounds.  Abdominal:     Tenderness: There is no abdominal tenderness.  Musculoskeletal:     Lumbar back: Tenderness present. Negative right straight leg raise test and negative left straight leg raise test.  Skin:    General: Skin is warm and moist.  Neurological:     General: No focal deficit present.     Mental  Status: He is alert and oriented to person, place, and time. Mental status is at baseline.     Sensory: Sensation is intact.     Motor: Motor function is intact.     Coordination: Coordination is intact.     Gait: Gait is intact. Gait normal.  Psychiatric:        Attention and Perception: Attention and perception normal.        Mood and Affect: Mood and affect normal.        Speech: Speech normal.        Behavior: Behavior normal. Behavior is cooperative.        Thought Content: Thought content normal.        Cognition and Memory: Cognition and memory normal.        Judgment: Judgment normal.    Assessment  Plan  Pre-op evaluation - Plan: Comprehensive metabolic panel, CBC with Differential/Platelet, Urinalysis, Routine w reflex microscopic, Protime-INR, EKG 12-Lead NSR   HM Flu shot utd given today UTD Tdap  MMR immune hep B had 3/3 vaccines consider new hep b vx and immune    covid 3/3 disc booster had 3rd dose ~03/2021 consider 4th dose 02/2021 had covid 11/2020    Std utd     Colonoscopy 03/2019 neg f/u in 10 years PSA nl 06/27/19 -DRE in future age 81 D3 5000 IU and mvt rec Fasting labs 06/2021 rec healthy diet and exercise Provider: Dr. Olivia Mackie McLean-Scocuzza-Internal Medicine

## 2021-04-04 NOTE — Patient Instructions (Addendum)
Think about 4th covid vaccine dose

## 2021-04-21 DIAGNOSIS — M1711 Unilateral primary osteoarthritis, right knee: Secondary | ICD-10-CM | POA: Insufficient documentation

## 2021-07-16 ENCOUNTER — Other Ambulatory Visit (INDEPENDENT_AMBULATORY_CARE_PROVIDER_SITE_OTHER): Payer: BC Managed Care – PPO

## 2021-07-16 ENCOUNTER — Other Ambulatory Visit: Payer: Self-pay | Admitting: Internal Medicine

## 2021-07-16 ENCOUNTER — Other Ambulatory Visit: Payer: Self-pay

## 2021-07-16 ENCOUNTER — Encounter: Payer: Self-pay | Admitting: Internal Medicine

## 2021-07-16 DIAGNOSIS — Z0000 Encounter for general adult medical examination without abnormal findings: Secondary | ICD-10-CM

## 2021-07-16 DIAGNOSIS — E559 Vitamin D deficiency, unspecified: Secondary | ICD-10-CM | POA: Diagnosis not present

## 2021-07-16 DIAGNOSIS — Z125 Encounter for screening for malignant neoplasm of prostate: Secondary | ICD-10-CM | POA: Diagnosis not present

## 2021-07-16 DIAGNOSIS — Z1329 Encounter for screening for other suspected endocrine disorder: Secondary | ICD-10-CM | POA: Diagnosis not present

## 2021-07-16 DIAGNOSIS — Z1389 Encounter for screening for other disorder: Secondary | ICD-10-CM

## 2021-07-16 DIAGNOSIS — E785 Hyperlipidemia, unspecified: Secondary | ICD-10-CM

## 2021-07-16 DIAGNOSIS — E291 Testicular hypofunction: Secondary | ICD-10-CM

## 2021-07-16 LAB — CBC WITH DIFFERENTIAL/PLATELET
Basophils Absolute: 0.1 10*3/uL (ref 0.0–0.1)
Basophils Relative: 1.1 % (ref 0.0–3.0)
Eosinophils Absolute: 0.3 10*3/uL (ref 0.0–0.7)
Eosinophils Relative: 6.7 % — ABNORMAL HIGH (ref 0.0–5.0)
HCT: 43.5 % (ref 39.0–52.0)
Hemoglobin: 14 g/dL (ref 13.0–17.0)
Lymphocytes Relative: 32.1 % (ref 12.0–46.0)
Lymphs Abs: 1.7 10*3/uL (ref 0.7–4.0)
MCHC: 32.1 g/dL (ref 30.0–36.0)
MCV: 85.8 fl (ref 78.0–100.0)
Monocytes Absolute: 0.6 10*3/uL (ref 0.1–1.0)
Monocytes Relative: 11.3 % (ref 3.0–12.0)
Neutro Abs: 2.5 10*3/uL (ref 1.4–7.7)
Neutrophils Relative %: 48.8 % (ref 43.0–77.0)
Platelets: 223 10*3/uL (ref 150.0–400.0)
RBC: 5.08 Mil/uL (ref 4.22–5.81)
RDW: 14.8 % (ref 11.5–15.5)
WBC: 5.1 10*3/uL (ref 4.0–10.5)

## 2021-07-16 LAB — COMPREHENSIVE METABOLIC PANEL
ALT: 21 U/L (ref 0–53)
AST: 16 U/L (ref 0–37)
Albumin: 4 g/dL (ref 3.5–5.2)
Alkaline Phosphatase: 78 U/L (ref 39–117)
BUN: 13 mg/dL (ref 6–23)
CO2: 30 mEq/L (ref 19–32)
Calcium: 10 mg/dL (ref 8.4–10.5)
Chloride: 103 mEq/L (ref 96–112)
Creatinine, Ser: 1.39 mg/dL (ref 0.40–1.50)
GFR: 60.45 mL/min (ref >60.00)
Glucose, Bld: 102 mg/dL — ABNORMAL HIGH (ref 70–99)
Potassium: 4.4 mEq/L (ref 3.5–5.1)
Sodium: 140 mEq/L (ref 135–145)
Total Bilirubin: 0.6 mg/dL (ref 0.2–1.2)
Total Protein: 6.8 g/dL (ref 6.0–8.3)

## 2021-07-16 LAB — PSA: PSA: 0.42 ng/mL (ref 0.10–4.00)

## 2021-07-16 LAB — LIPID PANEL
Cholesterol: 196 mg/dL (ref 0–200)
HDL: 45.4 mg/dL (ref >39.00)
LDL Cholesterol: 119 mg/dL — ABNORMAL HIGH (ref 0–99)
NonHDL: 150.79
Total CHOL/HDL Ratio: 4
Triglycerides: 159 mg/dL — ABNORMAL HIGH (ref 0.0–149.0)
VLDL: 31.8 mg/dL (ref 0.0–40.0)

## 2021-07-16 LAB — TSH: TSH: 2.51 u[IU]/mL (ref 0.35–5.50)

## 2021-07-16 LAB — VITAMIN D 25 HYDROXY (VIT D DEFICIENCY, FRACTURES): VITD: 21.3 ng/mL — ABNORMAL LOW (ref 30.00–100.00)

## 2021-07-16 MED ORDER — CHOLECALCIFEROL 1.25 MG (50000 UT) PO CAPS: 50000.0000 [IU] | ORAL_CAPSULE | ORAL | 1 refills | Status: DC

## 2021-07-16 NOTE — Telephone Encounter (Signed)
Please advise, Patient had lab work drawn today. Okay to add on?

## 2021-07-16 NOTE — Telephone Encounter (Signed)
Add on sheet faxed 

## 2021-07-17 LAB — URINALYSIS, ROUTINE W REFLEX MICROSCOPIC
Bilirubin Urine: NEGATIVE
Glucose, UA: NEGATIVE
Hgb urine dipstick: NEGATIVE
Ketones, ur: NEGATIVE
Leukocytes,Ua: NEGATIVE
Nitrite: NEGATIVE
Protein, ur: NEGATIVE
Specific Gravity, Urine: 1.017 (ref 1.001–1.035)
pH: 6 (ref 5.0–8.0)

## 2021-08-04 ENCOUNTER — Other Ambulatory Visit: Payer: Self-pay

## 2021-08-04 ENCOUNTER — Encounter: Payer: Self-pay | Admitting: Internal Medicine

## 2021-08-05 ENCOUNTER — Other Ambulatory Visit: Payer: Self-pay

## 2021-08-05 ENCOUNTER — Ambulatory Visit: Payer: BC Managed Care – PPO | Admitting: Internal Medicine

## 2021-08-05 VITALS — BP 122/86 | HR 93 | Temp 98.1°F | Ht 72.0 in | Wt 280.4 lb

## 2021-08-05 DIAGNOSIS — E669 Obesity, unspecified: Secondary | ICD-10-CM

## 2021-08-05 DIAGNOSIS — R5383 Other fatigue: Secondary | ICD-10-CM

## 2021-08-05 DIAGNOSIS — B009 Herpesviral infection, unspecified: Secondary | ICD-10-CM

## 2021-08-05 DIAGNOSIS — Z0000 Encounter for general adult medical examination without abnormal findings: Secondary | ICD-10-CM

## 2021-08-05 DIAGNOSIS — E785 Hyperlipidemia, unspecified: Secondary | ICD-10-CM

## 2021-08-05 DIAGNOSIS — F419 Anxiety disorder, unspecified: Secondary | ICD-10-CM

## 2021-08-05 DIAGNOSIS — F32A Depression, unspecified: Secondary | ICD-10-CM

## 2021-08-05 MED ORDER — TADALAFIL 10 MG PO TABS
10.0000 mg | ORAL_TABLET | Freq: Every day | ORAL | 6 refills | Status: DC | PRN
Start: 2021-08-05 — End: 2022-01-05

## 2021-08-05 MED ORDER — WEGOVY 0.5 MG/0.5ML ~~LOC~~ SOAJ: 0.5000 mg | SUBCUTANEOUS | 0 refills | Status: DC

## 2021-08-05 MED ORDER — VALACYCLOVIR HCL 500 MG PO TABS: 500.0000 mg | ORAL_TABLET | ORAL | 3 refills | Status: DC

## 2021-08-05 MED ORDER — WEGOVY 1.7 MG/0.75ML ~~LOC~~ SOAJ: 1.7000 mg | SUBCUTANEOUS | 0 refills | Status: DC

## 2021-08-05 MED ORDER — WEGOVY 1 MG/0.5ML ~~LOC~~ SOAJ: 1.0000 mg | SUBCUTANEOUS | 0 refills | Status: DC

## 2021-08-05 MED ORDER — BUPROPION HCL ER (XL) 300 MG PO TB24: 300.0000 mg | ORAL_TABLET | Freq: Every day | ORAL | 3 refills | Status: DC

## 2021-08-05 MED ORDER — ALPRAZOLAM 0.25 MG PO TABS: 0.2500 mg | ORAL_TABLET | Freq: Every evening | ORAL | 5 refills | Status: DC | PRN

## 2021-08-05 MED ORDER — WEGOVY 0.25 MG/0.5ML ~~LOC~~ SOAJ: 0.2500 mg | SUBCUTANEOUS | 0 refills | Status: DC

## 2021-08-05 MED ORDER — WEGOVY 2.4 MG/0.75ML ~~LOC~~ SOAJ: 2.4000 mg | SUBCUTANEOUS | 5 refills | Status: DC

## 2021-08-05 NOTE — Addendum Note (Signed)
Addended by: Quentin Ore on: 08/05/2021 09:36 AM ? ? Modules accepted: Orders ? ?

## 2021-08-05 NOTE — Progress Notes (Signed)
Chief Complaint  ?Patient presents with  ? Follow-up  ? ?Annual  ?1. Obesity, hld wants to start wegovy dbp sl elevated coworker lost 15 lbs he wants to get down to 230s  ? ? ?Review of Systems  ?Constitutional:  Negative for weight loss.  ?HENT:  Negative for hearing loss.   ?Eyes:  Negative for blurred vision.  ?Respiratory:  Negative for shortness of breath.   ?Cardiovascular:  Negative for chest pain.  ?Gastrointestinal:  Negative for abdominal pain and blood in stool.  ?Musculoskeletal:  Negative for back pain.  ?Skin:  Negative for rash.  ?Neurological:  Negative for headaches.  ?Psychiatric/Behavioral:  Negative for depression.   ?Past Medical History:  ?Diagnosis Date  ? Arthritis   ? knee s/p right knee surgery meniscal tear with bone fragments, ankle (ortho Dr. Rosita Kea)  ? COVID-19   ? 11/2020  ? GERD (gastroesophageal reflux disease)   ? H. pylori infection   ? Herpes infection   ? OSA on CPAP   ? Sleep apnea 06/2017  ? PT JUST WAS DX WITH SLEEP APNEA LAST WEEK AND IS WAITING ON HIS CPAP MACHINE  ? ?Past Surgical History:  ?Procedure Laterality Date  ? COLONOSCOPY WITH PROPOFOL N/A 04/03/2019  ? Procedure: COLONOSCOPY WITH PROPOFOL;  Surgeon: [name], MD;  Location: [facility] ENDOSCOPY;  Service: Gastroenterology;  Laterality: N/A;  ? CYST EXCISION  12/12/2018  ? Procedure: CYST REMOVAL;  Surgeon: Signa Kell, MD;  Location: [facility] ORS;  Service: Orthopedics;;  ? ESOPHAGOGASTRODUODENOSCOPY (EGD) WITH PROPOFOL N/A 01/26/2018  ? Procedure: ESOPHAGOGASTRODUODENOSCOPY (EGD) WITH PROPOFOL;  Surgeon: [name], MD;  Location: [facility] ENDOSCOPY;  Service: Gastroenterology;  Laterality: N/A;  ? KIDNEY DONATION Left   ? 2004  ? KNEE ARTHROSCOPY WITH MEDIAL MENISECTOMY Right 07/15/2017  ? Procedure: KNEE ARTHROSCOPY WITH MEDIAL MENISECTOMY;  Surgeon: Kennedy Bucker, MD;  Location: [facility] ORS;  Service: Orthopedics;  Laterality: Right;  ? KNEE ARTHROSCOPY WITH MEDIAL MENISECTOMY Right 12/12/2018  ? Procedure: KNEE ARTHROSCOPY WITH  PARTIAL MEDIAL MENISECTOMY;  Surgeon: Signa Kell, MD;  Location: [facility] ORS;  Service: Orthopedics;  Laterality: Right;  ? meniscal tear    ? right knee Dr. Rosita Kea 2014   ? MENISECTOMY Right 07/15/2017  ? Procedure: MENISECTOMY;  Surgeon: Kennedy Bucker, MD;  Location: [facility] ORS;  Service: Orthopedics;  Laterality: Right;  ? ROBOTIC ASSISTED LAPAROSCOPIC NISSEN FUNDOPLICATION N/A 03/03/2018  ? Procedure: ROBOTIC ASSISTED LAPAROSCOPIC NISSEN FUNDOPLICATION WITH HIATAL HERNIA REPAIR;  Surgeon: Leafy Ro, MD;  Location: [facility] ORS;  Service: General;  Laterality: N/A;  ? UMBILICAL HERNIA REPAIR N/A 03/03/2018  ? Procedure: HERNIA REPAIR UMBILICAL ADULT;  Surgeon: Leafy Ro, MD;  Location: [facility] ORS;  Service: General;  Laterality: N/A;  ? ?Family History  ?Problem Relation Age of Onset  ? Drug abuse Mother   ? Hyperlipidemia Mother   ? Hypertension Mother   ? Cancer Maternal Aunt   ?     brain, breast, lung ? primary was smoker   ? Prostate cancer Neg Hx   ? Bladder Cancer Neg Hx   ? Kidney cancer Neg Hx   ? ?Social History  ? ?Socioeconomic History  ? Marital status: Divorced  ?  Spouse name: Not on file  ? Number of children: Not on file  ? Years of education: Not on file  ? Highest education level: Not on file  ?Occupational History  ? Not on file  ?Tobacco Use  ? Smoking status: Never  ? Smokeless tobacco: Never  ?  Tobacco comments:  ?  light in the past social smoker quit  ?Vaping Use  ? Vaping Use: Never used  ?Substance and Sexual Activity  ? Alcohol use: Yes  ?  Alcohol/week: 1.0 - 2.0 standard drink  ?  Types: 1 - 2 Shots of liquor per week  ? Drug use: No  ? Sexual activity: Yes  ?Other Topics Concern  ? Not on file  ?Social History Narrative  ? 2 year college   ? 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 [place]  ? ?Social Determinants of Health  ? ?Financial Resource Strain: Not on file  ?Food Insecurity: Not on file   ?Transportation Needs: Not on file  ?Physical Activity: Not on file  ?Stress: Not on file  ?Social Connections: Not on file  ?Intimate Partner Violence: Not on file  ? ?Current Meds  ?Medication Sig  ? ALPRAZolam (XANAX) 0.25 MG tablet Take 1 tablet (0.25 mg total) by mouth at bedtime as needed for anxiety.  ? buPROPion (WELLBUTRIN XL) 300 MG 24 hr tablet Take 1 tablet (300 mg total) by mouth daily.  ? Cholecalciferol 1.25 MG (50000 UT) capsule Take 1 capsule (50,000 Units total) by mouth once a week. D3  ? Olopatadine HCl 0.2 % SOLN Place 1 drop into both eyes daily as needed (runny eyes).  ? Semaglutide-Weight Management (WEGOVY) 0.25 MG/0.5ML SOAJ Inject 0.25 mg into the skin once a week.  ? Semaglutide-Weight Management (WEGOVY) 0.5 MG/0.5ML SOAJ Inject 0.5 mg into the skin once a week.  ? Semaglutide-Weight Management (WEGOVY) 1 MG/0.5ML SOAJ Inject 1 mg into the skin once a week.  ? Semaglutide-Weight Management (WEGOVY) 1.7 MG/0.75ML SOAJ Inject 1.7 mg into the skin once a week.  ? Semaglutide-Weight Management (WEGOVY) 2.4 MG/0.75ML SOAJ Inject 2.4 mg into the skin once a week.  ? tadalafil (CIALIS) 10 MG tablet Take 1 tablet (10 mg total) by mouth daily as needed for erectile dysfunction.  ? valACYclovir (VALTREX) 500 MG tablet Take 1 tablet (500 mg total) by mouth See admin instructions. Take 500 mg daily for prevention. With outbreak take 500 mg twice daily for 3 to 7 days  ? ?No Known Allergies ?Recent Results (from the past 2160 hour(s))  ?PSA     Status: None  ? Collection Time: 07/16/21  7:33 AM  ?Result Value Ref Range  ? PSA 0.42 0.10 - 4.00 ng/mL  ?  Comment: Test performed using Access Hybritech PSA Assay, a parmagnetic partical, chemiluminecent immunoassay.  ?Vitamin D (25 hydroxy)     Status: Abnormal  ? Collection Time: 07/16/21  7:33 AM  ?Result Value Ref Range  ? VITD 21.30 (L) 30.00 - 100.00 ng/mL  ?Urinalysis, Routine w reflex microscopic     Status: None  ? Collection Time: 07/16/21  7:33  AM  ?Result Value Ref Range  ? Color, Urine YELLOW YELLOW  ? APPearance CLEAR CLEAR  ? Specific Gravity, Urine 1.017 1.001 - 1.035  ? pH 6.0 5.0 - 8.0  ? Glucose, UA NEGATIVE NEGATIVE  ? Bilirubin Urine NEGATIVE NEGATIVE  ? Ketones, ur NEGATIVE NEGATIVE  ? Hgb urine dipstick NEGATIVE NEGATIVE  ? Protein, ur NEGATIVE NEGATIVE  ? Nitrite NEGATIVE NEGATIVE  ? Leukocytes,Ua NEGATIVE NEGATIVE  ?TSH     Status: None  ? Collection Time: 07/16/21  7:33 AM  ?Result Value Ref Range  ? TSH 2.51 0.35 - 5.50 uIU/mL  ?CBC with Differential/Platelet  Status: Abnormal  ? Collection Time: 07/16/21  7:33 AM  ?Result Value Ref Range  ? WBC 5.1 4.0 - 10.5 K/uL  ? RBC 5.08 4.22 - 5.81 Mil/uL  ? Hemoglobin 14.0 13.0 - 17.0 g/dL  ? HCT 43.5 39.0 - 52.0 %  ? MCV 85.8 78.0 - 100.0 fl  ? MCHC 32.1 30.0 - 36.0 g/dL  ? RDW 14.8 11.5 - 15.5 %  ? Platelets 223.0 150.0 - 400.0 K/uL  ? Neutrophils Relative % 48.8 43.0 - 77.0 %  ? Lymphocytes Relative 32.1 12.0 - 46.0 %  ? Monocytes Relative 11.3 3.0 - 12.0 %  ? Eosinophils Relative 6.7 (H) 0.0 - 5.0 %  ? Basophils Relative 1.1 0.0 - 3.0 %  ? Neutro Abs 2.5 1.4 - 7.7 K/uL  ? Lymphs Abs 1.7 0.7 - 4.0 K/uL  ? Monocytes Absolute 0.6 0.1 - 1.0 K/uL  ? Eosinophils Absolute 0.3 0.0 - 0.7 K/uL  ? Basophils Absolute 0.1 0.0 - 0.1 K/uL  ?Lipid panel     Status: Abnormal  ? Collection Time: 07/16/21  7:33 AM  ?Result Value Ref Range  ? Cholesterol 196 0 - 200 mg/dL  ?  Comment: ATP III Classification       Desirable:  < 200 mg/dL               Borderline High:  200 - 239 mg/dL          High:  > = 761 mg/dL  ? Triglycerides 159.0 (H) 0.0 - 149.0 mg/dL  ?  Comment: Normal:  <150 mg/dLBorderline High:  150 - 199 mg/dL  ? HDL 45.40 >39.00 mg/dL  ? VLDL 31.8 0.0 - 40.0 mg/dL  ? LDL Cholesterol 119 (H) 0 - 99 mg/dL  ? Total CHOL/HDL Ratio 4   ?  Comment:                Men          Women1/2 Average Risk     3.4          3.3Average Risk          5.0          4.42X Average Risk          9.6          7.13X Average  Risk          15.0          11.0                      ? NonHDL 150.79   ?  Comment: NOTE:  Non-HDL goal should be 30 mg/dL higher than patient's LDL goal (i.e. LDL goal of < 70 mg/dL, would have non-HDL goal

## 2021-08-05 NOTE — Patient Instructions (Signed)
Semaglutide Injection (Weight Management) ?What is this medication? ?SEMAGLUTIDE (SEM a GLOO tide) promotes weight loss. It may also be used to maintain weight loss. It works by decreasing appetite. Changes to diet and exercise are often combined with this medication. ?This medicine may be used for other purposes; ask your health care provider or pharmacist if you have questions. ?COMMON BRAND NAME(S): Wegovy ?What should I tell my care team before I take this medication? ?They need to know if you have any of these conditions: ?Endocrine tumors (MEN 2) or if someone in your family had these tumors ?Eye disease, vision problems ?Gallbladder disease ?History of depression or mental health disease ?History of pancreatitis ?Kidney disease ?Stomach or intestine problems ?Suicidal thoughts, plans, or attempt; a previous suicide attempt by you or a family member ?Thyroid cancer or if someone in your family had thyroid cancer ?An unusual or allergic reaction to semaglutide, other medications, foods, dyes, or preservatives ?Pregnant or trying to get pregnant ?Breast-feeding ?How should I use this medication? ?This medication is injected under the skin. You will be taught how to prepare and give it. Take it as directed on the prescription label. It is given once every week (every 7 days). Keep taking it unless your care team tells you to stop. ?It is important that you put your used needles and pens in a special sharps container. Do not put them in a trash can. If you do not have a sharps container, call your pharmacist or care team to get one. ?A special MedGuide will be given to you by the pharmacist with each prescription and refill. Be sure to read this information carefully each time. ?This medication comes with INSTRUCTIONS FOR USE. Ask your pharmacist for directions on how to use this medication. Read the information carefully. Talk to your pharmacist or care team if you have questions. ?Talk to your care team about  the use of this medication in children. Special care may be needed. ?Overdosage: If you think you have taken too much of this medicine contact a poison control center or emergency room at once. ?NOTE: This medicine is only for you. Do not share this medicine with others. ?What if I miss a dose? ?If you miss a dose and the next scheduled dose is more than 2 days away, take the missed dose as soon as possible. If you miss a dose and the next scheduled dose is less than 2 days away, do not take the missed dose. Take the next dose at your regular time. Do not take double or extra doses. If you miss your dose for 2 weeks or more, take the next dose at your regular time or call your care team to talk about how to restart this medication. ?What may interact with this medication? ?Insulin and other medications for diabetes ?This list may not describe all possible interactions. Give your health care provider a list of all the medicines, herbs, non-prescription drugs, or dietary supplements you use. Also tell them if you smoke, drink alcohol, or use illegal drugs. Some items may interact with your medicine. ?What should I watch for while using this medication? ?Visit your care team for regular checks on your progress. It may be some time before you see the benefit from this medication. ?Drink plenty of fluids while taking this medication. Check with your care team if you have severe diarrhea, nausea, and vomiting, or if you sweat a lot. The loss of too much body fluid may make it dangerous for   you to take this medication. ?This medication may affect blood sugar levels. Ask your care team if changes in diet or medications are needed if you have diabetes. ?If you or your family notice any changes in your behavior, such as new or worsening depression, thoughts of harming yourself, anxiety, other unusual or disturbing thoughts, or memory loss, call your care team right away. ?Women should inform their care team if they wish to  become pregnant or think they might be pregnant. Losing weight while pregnant is not advised and may cause harm to the unborn child. Talk to your care team for more information. ?What side effects may I notice from receiving this medication? ?Side effects that you should report to your care team as soon as possible: ?Allergic reactions--skin rash, itching, hives, swelling of the face, lips, tongue, or throat ?Change in vision ?Dehydration--increased thirst, dry mouth, feeling faint or lightheaded, headache, dark yellow or brown urine ?Gallbladder problems--severe stomach pain, nausea, vomiting, fever ?Heart palpitations--rapid, pounding, or irregular heartbeat ?Kidney injury--decrease in the amount of urine, swelling of the ankles, hands, or feet ?Pancreatitis--severe stomach pain that spreads to your back or gets worse after eating or when touched, fever, nausea, vomiting ?Thoughts of suicide or self-harm, worsening mood, feelings of depression ?Thyroid cancer--new mass or lump in the neck, pain or trouble swallowing, trouble breathing, hoarseness ?Side effects that usually do not require medical attention (report to your care team if they continue or are bothersome): ?Diarrhea ?Loss of appetite ?Nausea ?Stomach pain ?Vomiting ?This list may not describe all possible side effects. Call your doctor for medical advice about side effects. You may report side effects to FDA at 1-800-FDA-1088. ?Where should I keep my medication? ?Keep out of the reach of children and pets. ?Refrigeration (preferred): Store in the refrigerator. Do not freeze. Keep this medication in the original container until you are ready to take it. Get rid of any unused medication after the expiration date. ?Room temperature: If needed, prior to cap removal, the pen can be stored at room temperature for up to 28 days. Protect from light. If it is stored at room temperature, get rid of any unused medication after 28 days or after it expires,  whichever is first. ?It is important to get rid of the medication as soon as you no longer need it or it is expired. You can do this in two ways: ?Take the medication to a medication take-back program. Check with your pharmacy or law enforcement to find a location. ?If you cannot return the medication, follow the directions in the MedGuide. ?NOTE: This sheet is a summary. It may not cover all possible information. If you have questions about this medicine, talk to your doctor, pharmacist, or health care provider. ?? 2022 Elsevier/Gold Standard (2020-08-16 00:00:00) ? ?

## 2021-12-26 ENCOUNTER — Ambulatory Visit: Payer: BC Managed Care – PPO | Admitting: Internal Medicine

## 2022-01-05 ENCOUNTER — Encounter: Payer: Self-pay | Admitting: Urology

## 2022-01-05 ENCOUNTER — Ambulatory Visit: Payer: BC Managed Care – PPO | Admitting: Urology

## 2022-01-05 VITALS — BP 128/88 | HR 88 | Ht 72.0 in | Wt 275.0 lb

## 2022-01-05 DIAGNOSIS — Z3009 Encounter for other general counseling and advice on contraception: Secondary | ICD-10-CM

## 2022-01-05 DIAGNOSIS — A630 Anogenital (venereal) warts: Secondary | ICD-10-CM | POA: Diagnosis not present

## 2022-01-05 DIAGNOSIS — Z125 Encounter for screening for malignant neoplasm of prostate: Secondary | ICD-10-CM

## 2022-01-05 DIAGNOSIS — N138 Other obstructive and reflux uropathy: Secondary | ICD-10-CM

## 2022-01-05 DIAGNOSIS — N401 Enlarged prostate with lower urinary tract symptoms: Secondary | ICD-10-CM

## 2022-01-05 MED ORDER — TADALAFIL 10 MG PO TABS: 10.0000 mg | ORAL_TABLET | Freq: Every day | ORAL | 11 refills | Status: DC

## 2022-01-05 NOTE — Patient Instructions (Signed)

## 2022-01-05 NOTE — Progress Notes (Signed)
   01/05/2022 9:03 AM   Joshua Ramirez February 23, 1976 [id number]  Reason for visit: Follow up ED, PSA screening, urinary symptoms, solitary right kidney, discussed vasectomy  HPI: 46 year old male who I followed for the above issues.  He is on Cialis 10 mg as needed with good results.  He does not have a family history of prostate cancer, and PSA has been within the normal range, most recently 0.42 in February 2023.  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  Renal function is stable with recent creatinine of 1.39, EGFR greater than 60.  He has some mild urinary symptoms of straining and some weak stream.  Recent urinalysis was benign.  I recommended by avoid bladder irritants and taking the Cialis daily to see if we can improve some of his urinary symptoms.  Primary reason for visit today is to discuss vasectomy.  He recently went through a separation and is interested in vasectomy.  He has a 48 year old.  He also has a 1 cm genital wart on the right dorsal aspect of the phallus that he is interested in having removed.  On exam, circumcised phallus with patent meatus, 1 cm genital wart at the right dorsal lateral aspect, testicles 20 cc and descended bilaterally without masses, vas deferens palpable bilaterally, but thickened cords.  We discussed the risks and benefits of vasectomy at length.  Vasectomy is intended to be a permanent form of contraception, and does not produce immediate sterility.  Following vasectomy another form of contraception is required until vas occlusion is confirmed by a post-vasectomy semen analysis obtained 2-3 months after the procedure.  Even after vas occlusion is confirmed, vasectomy is not 100% reliable in preventing pregnancy, and the failure rate is approximately 05/1998.  Repeat vasectomy is required in less than 1% of patients.  He should refrain from ejaculation for 1 week after vasectomy.  Options for fertility after vasectomy include  vasectomy reversal, and sperm retrieval with in vitro fertilization or ICSI.  These options are not always successful and may be expensive.  Finally, there are other permanent and non-permanent alternatives to vasectomy available. There is no risk of erectile dysfunction, and the volume of semen will be similar to prior, as the majority of the ejaculate is from the prostate and seminal vesicles.   The procedure takes ~20 minutes.  We recommend patients take 5-10 mg of Valium 30 minutes prior, and he will need a driver post-procedure.  Local anesthetic is injected into the scrotal skin and a small segment of the vas deferens is removed, and the ends occluded. The complication rate is approximately 1-2%, and includes bleeding, infection, and development of chronic scrotal pain.  We will also plan for simultaneous surgical excision of genital wart.  We discussed alternatives including observation, imiquimod, or cryo therapy.  He prefers excision.    PLAN: Schedule vasectomy and excision of genital wart Has Xanax at baseline and will take 1 prior to procedure Cialis changed to 10 mg daily in the setting of [place], MD  [facility] [address] [place], [place] [address] [phone]

## 2022-01-20 ENCOUNTER — Ambulatory Visit: Payer: BC Managed Care – PPO | Admitting: Internal Medicine

## 2022-01-20 ENCOUNTER — Encounter: Payer: Self-pay | Admitting: Internal Medicine

## 2022-01-20 VITALS — BP 118/78 | HR 82 | Temp 98.2°F | Ht 72.0 in | Wt 279.4 lb

## 2022-01-20 DIAGNOSIS — E538 Deficiency of other specified B group vitamins: Secondary | ICD-10-CM

## 2022-01-20 DIAGNOSIS — E559 Vitamin D deficiency, unspecified: Secondary | ICD-10-CM

## 2022-01-20 DIAGNOSIS — F32A Depression, unspecified: Secondary | ICD-10-CM

## 2022-01-20 DIAGNOSIS — F419 Anxiety disorder, unspecified: Secondary | ICD-10-CM

## 2022-01-20 DIAGNOSIS — G4733 Obstructive sleep apnea (adult) (pediatric): Secondary | ICD-10-CM | POA: Diagnosis not present

## 2022-01-20 DIAGNOSIS — Z1389 Encounter for screening for other disorder: Secondary | ICD-10-CM

## 2022-01-20 DIAGNOSIS — Z23 Encounter for immunization: Secondary | ICD-10-CM

## 2022-01-20 DIAGNOSIS — R5383 Other fatigue: Secondary | ICD-10-CM | POA: Diagnosis not present

## 2022-01-20 DIAGNOSIS — E785 Hyperlipidemia, unspecified: Secondary | ICD-10-CM

## 2022-01-20 DIAGNOSIS — B009 Herpesviral infection, unspecified: Secondary | ICD-10-CM

## 2022-01-20 DIAGNOSIS — Z125 Encounter for screening for malignant neoplasm of prostate: Secondary | ICD-10-CM

## 2022-01-20 DIAGNOSIS — Z9989 Dependence on other enabling machines and devices: Secondary | ICD-10-CM

## 2022-01-20 MED ORDER — TADALAFIL 10 MG PO TABS
10.0000 mg | ORAL_TABLET | Freq: Every day | ORAL | 11 refills | Status: DC
Start: 2022-01-20 — End: 2022-03-27

## 2022-01-20 MED ORDER — ALPRAZOLAM 0.25 MG PO TABS: 0.2500 mg | ORAL_TABLET | Freq: Every evening | ORAL | 5 refills | Status: DC | PRN

## 2022-01-20 MED ORDER — VALACYCLOVIR HCL 500 MG PO TABS: 500.0000 mg | ORAL_TABLET | ORAL | 3 refills | Status: DC

## 2022-01-20 MED ORDER — BUPROPION HCL ER (XL) 300 MG PO TB24: 300.0000 mg | ORAL_TABLET | Freq: Every day | ORAL | 3 refills | Status: DC

## 2022-01-20 NOTE — Patient Instructions (Addendum)
D3 4000 to max 5000 IU daily  Colonoscopy due 04/02/2029  Next time speak to Dentist about mouth piece for sleep apnea   New PCP Dr.Walsh

## 2022-01-20 NOTE — Progress Notes (Signed)
Chief Complaint  Patient presents with   Annual Exam   F/u 1. Fatigue not using cpap severe osa due to claustrophobia  He is taking vit D and will recheck 06/2022     Review of Systems  Constitutional:  Positive for malaise/fatigue. Negative for weight loss.  HENT:  Negative for hearing loss.   Eyes:  Negative for blurred vision.  Respiratory:  Negative for shortness of breath.   Cardiovascular:  Negative for chest pain.  Gastrointestinal:  Negative for abdominal pain and blood in stool.  Musculoskeletal:  Negative for back pain.  Skin:  Negative for rash.  Neurological:  Negative for headaches.  Psychiatric/Behavioral:  Negative for depression.    Past Medical History:  Diagnosis Date   Arthritis    knee s/p right knee surgery meniscal tear with bone fragments, ankle (ortho Dr. Rudene Christians)   COVID-19    11/2020   GERD (gastroesophageal reflux disease)    H. pylori infection    Herpes infection    OSA on CPAP    Sleep apnea 06/2017   PT JUST WAS DX WITH SLEEP APNEA LAST WEEK AND IS WAITING ON HIS CPAP MACHINE   Past Surgical History:  Procedure Laterality Date   COLONOSCOPY WITH PROPOFOL N/A 04/03/2019   Procedure: COLONOSCOPY WITH PROPOFOL;  Surgeon: [name], MD;  Location: [facility] ENDOSCOPY;  Service: Gastroenterology;  Laterality: N/A;   CYST EXCISION  12/12/2018   Procedure: CYST REMOVAL;  Surgeon: Leim Fabry, MD;  Location: [facility] ORS;  Service: Orthopedics;;   ESOPHAGOGASTRODUODENOSCOPY (EGD) WITH PROPOFOL N/A 01/26/2018   Procedure: ESOPHAGOGASTRODUODENOSCOPY (EGD) WITH PROPOFOL;  Surgeon: [name], MD;  Location: [facility] ENDOSCOPY;  Service: Gastroenterology;  Laterality: N/A;   KIDNEY DONATION Left    2004   KNEE ARTHROSCOPY WITH MEDIAL MENISECTOMY Right 07/15/2017   Procedure: KNEE ARTHROSCOPY WITH MEDIAL MENISECTOMY;  Surgeon: Hessie Knows, MD;  Location: [facility] ORS;  Service: Orthopedics;  Laterality: Right;   KNEE ARTHROSCOPY WITH MEDIAL MENISECTOMY Right  12/12/2018   Procedure: KNEE ARTHROSCOPY WITH PARTIAL MEDIAL MENISECTOMY;  Surgeon: Leim Fabry, MD;  Location: [facility] ORS;  Service: Orthopedics;  Laterality: Right;   meniscal tear     right knee Dr. Rudene Christians 2014    MENISECTOMY Right 07/15/2017   Procedure: MENISECTOMY;  Surgeon: Hessie Knows, MD;  Location: [facility] ORS;  Service: Orthopedics;  Laterality: Right;   ROBOTIC ASSISTED LAPAROSCOPIC NISSEN FUNDOPLICATION N/A 44/05/270   Procedure: ROBOTIC ASSISTED LAPAROSCOPIC NISSEN FUNDOPLICATION WITH HIATAL HERNIA REPAIR;  Surgeon: Jules Husbands, MD;  Location: [facility] ORS;  Service: General;  Laterality: N/A;   UMBILICAL HERNIA REPAIR N/A 03/03/2018   Procedure: HERNIA REPAIR UMBILICAL ADULT;  Surgeon: Jules Husbands, MD;  Location: [facility] ORS;  Service: General;  Laterality: N/A;   VASECTOMY     02/04/22   Family History  Problem Relation Age of Onset   Drug abuse Mother    Hyperlipidemia Mother    Hypertension Mother    Cancer Maternal Aunt        brain, breast, lung ? primary was smoker    Prostate cancer Neg Hx    Bladder Cancer Neg Hx    Kidney cancer Neg Hx    Social History   Socioeconomic History   Marital status: Divorced    Spouse name: Not on file   Number of children: Not on file   Years of education: Not on file   Highest education level: Not on file  Occupational History   Not on file  Tobacco  Use   Smoking status: Never    Passive exposure: Never   Smokeless tobacco: Never   Tobacco comments:    light in the past social smoker quit  Vaping Use   Vaping Use: Never used  Substance and Sexual Activity   Alcohol use: Yes    Alcohol/week: 1.0 - 2.0 standard drink of alcohol    Types: 1 - 2 Shots of liquor per week   Drug use: No   Sexual activity: Yes  Other Topics Concern   Not on file  Social History Narrative   2 year college    Works Comptroller, coaches girls basketball, football, 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   buPROPion (WELLBUTRIN XL) 300 MG 24 hr tablet Take 1 tablet (300 mg total) by mouth daily.   Cholecalciferol 1.25 MG (50000 UT) capsule Take 1 capsule (50,000 Units total) by mouth once a week. D3   Olopatadine HCl 0.2 % SOLN Place 1 drop into both eyes daily as needed (runny eyes).   Semaglutide-Weight Management (WEGOVY) 0.25 MG/0.5ML SOAJ Inject 0.25 mg into the skin once a week.   Semaglutide-Weight Management (WEGOVY) 0.5 MG/0.5ML SOAJ Inject 0.5 mg into the skin once a week.   Semaglutide-Weight Management (WEGOVY) 1 MG/0.5ML SOAJ Inject 1 mg into the skin once a week.   Semaglutide-Weight Management (WEGOVY) 1.7 MG/0.75ML SOAJ Inject 1.7 mg into the skin once a week.   Semaglutide-Weight Management (WEGOVY) 2.4 MG/0.75ML SOAJ Inject 2.4 mg into the skin once a week.   tadalafil (CIALIS) 10 MG tablet Take 1 tablet (10 mg total) by mouth daily.   valACYclovir (VALTREX) 500 MG tablet Take 1 tablet (500 mg total) by mouth See admin instructions. Take 500 mg daily for prevention. With outbreak take 500 mg twice daily for 3 to 7 days   No Known Allergies No results found for this or any previous visit (from the past 2160 hour(s)). Objective  Body mass index is 37.89 kg/m. Wt Readings from Last 3 Encounters:  01/20/22 279 lb 6.4 oz (126.7 kg)  01/05/22 275 lb (124.7 kg)  08/05/21 280 lb 6.4 oz (127.2 kg)   Temp Readings from Last 3 Encounters:  01/20/22 98.2 F (36.8 C) (Oral)  08/05/21 98.1 F (36.7 C) (Oral)  04/04/21 (!) 97.1 F (36.2 C) (Temporal)   BP Readings from Last  3 Encounters:  01/20/22 118/78  01/05/22 128/88  08/05/21 122/86   Pulse Readings from Last 3 Encounters:  01/20/22 82  01/05/22 88  08/05/21 93    Physical Exam Vitals and nursing note reviewed.  Constitutional:      Appearance: Normal appearance. He is well-developed and well-groomed.  HENT:     Head: Normocephalic and atraumatic.  Eyes:     Conjunctiva/sclera: Conjunctivae normal.     Pupils: Pupils are equal, round, and reactive to light.  Cardiovascular:     Rate and Rhythm: Normal rate and regular rhythm.     Heart sounds: Normal heart sounds.  Pulmonary:  Effort: Pulmonary effort is normal. No respiratory distress.     Breath sounds: Normal breath sounds.  Abdominal:     Tenderness: There is no abdominal tenderness.  Skin:    General: Skin is warm and moist.  Neurological:     General: No focal deficit present.     Mental Status: He is alert and oriented to person, place, and time. Mental status is at baseline.     Sensory: Sensation is intact.     Motor: Motor function is intact.     Coordination: Coordination is intact.     Gait: Gait is intact. Gait normal.  Psychiatric:        Attention and Perception: Attention and perception normal.        Mood and Affect: Mood and affect normal.        Speech: Speech normal.        Behavior: Behavior normal. Behavior is cooperative.        Thought Content: Thought content normal.        Cognition and Memory: Cognition and memory normal.        Judgment: Judgment normal.     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 Diamantina Providence   -DRE in future age 48 D3 5000 IU and mvt rec Fasting labs 06/2021 rec healthy diet and exercise Provider: Dr. Olivia Mackie McLean-Scocuzza-Internal Medicine

## 2022-02-04 ENCOUNTER — Ambulatory Visit: Payer: BC Managed Care – PPO | Admitting: Urology

## 2022-02-04 ENCOUNTER — Encounter: Payer: Self-pay | Admitting: Urology

## 2022-02-04 VITALS — BP 124/80 | HR 80 | Ht 72.0 in | Wt 275.0 lb

## 2022-02-04 DIAGNOSIS — Z302 Encounter for sterilization: Secondary | ICD-10-CM | POA: Diagnosis not present

## 2022-02-04 DIAGNOSIS — A630 Anogenital (venereal) warts: Secondary | ICD-10-CM | POA: Diagnosis not present

## 2022-02-04 DIAGNOSIS — N489 Disorder of penis, unspecified: Secondary | ICD-10-CM

## 2022-02-04 NOTE — Patient Instructions (Signed)

## 2022-02-04 NOTE — Progress Notes (Signed)
VASECTOMY/genital skin lesion excision PROCEDURE NOTE:  The patient was taken to the minor procedure room and placed in the supine position. His genitals were prepped and draped in the usual sterile fashion.   There was a 1 cm genital wart at the dorsal midshaft of the penis.  Lidocaine was injected under the lesion, and the lesion was excised entirely using iris scissors.  A running chromic was used for closure.  There was excellent hemostasis.  The right vas deferens was brought up to the skin of the right upper scrotum. The skin overlying it was anesthetized with 1% lidocaine without epinephrine, anesthetic was also injected alongside the vas deferens in the direction of the inguinal canal. The no scalpel vasectomy instrument was used to make a small perforation in the scrotal skin. The vasectomy clamp was used to grasp the vas deferens. It was carefully dissected free from surrounding structures. A 1cm segment of the vas was removed, and the cut ends of the mucosa were cauterized. No significant bleeding was noted. The vas deferens was returned to the scrotum. The skin incision was closed with a simple interrupted stitch of 4-0 chromic.  Attention was then turned to the left side. The left vasectomy was performed in the same exact fashion. Sterile dressings were placed over each incision. The patient tolerated the procedure well.  IMPRESSION/DIAGNOSIS: The patient is a 46 year old gentleman who underwent a vasectomy today. Post-procedure instructions were reviewed. I stressed the importance of continuing to use birth control until he provides a semen specimen more than 2 months from now that demonstrates azoospermia.  We discussed return precautions including fever over 101, significant bleeding or hematoma, or uncontrolled pain. I also stressed the importance of avoiding strenuous activity for one week, no sexual activity or ejaculations for 5 days, intermittent icing over the next 48 hours, and  scrotal support.   PLAN: The patient will be advised of his semen analysis results when available. Follow-up pathology of genital skin lesion   Legrand Rams, MD 02/04/2022

## 2022-02-05 ENCOUNTER — Ambulatory Visit: Payer: BC Managed Care – PPO | Admitting: Internal Medicine

## 2022-02-05 ENCOUNTER — Ambulatory Visit: Payer: BC Managed Care – PPO | Admitting: Urology

## 2022-02-06 LAB — SURGICAL PATHOLOGY

## 2022-02-10 ENCOUNTER — Ambulatory Visit: Payer: BC Managed Care – PPO | Admitting: Internal Medicine

## 2022-03-27 ENCOUNTER — Other Ambulatory Visit: Payer: Self-pay | Admitting: Urology

## 2022-05-04 ENCOUNTER — Other Ambulatory Visit: Payer: Self-pay

## 2022-05-04 DIAGNOSIS — N529 Male erectile dysfunction, unspecified: Secondary | ICD-10-CM

## 2022-05-04 MED ORDER — TADALAFIL 10 MG PO TABS: 10.0000 mg | ORAL_TABLET | Freq: Every day | ORAL | 11 refills | Status: AC | PRN

## 2022-05-04 NOTE — Telephone Encounter (Signed)
From: Colette Ribas To: Office of Sondra Come, MD Sent: 05/04/2022 8:29 AM EST Subject: Medication Renewal Request  Refills have been requested for the following medications:   tadalafil (CIALIS) 10 MG tablet [Brian C Sninsky]  Preferred pharmacy: [id number] - [place], [place] - [address] Delivery method: Pickup

## 2022-05-08 ENCOUNTER — Other Ambulatory Visit: Payer: BC Managed Care – PPO

## 2022-05-08 DIAGNOSIS — Z302 Encounter for sterilization: Secondary | ICD-10-CM

## 2022-05-09 LAB — POST-VAS SPERM EVALUATION,QUAL: Volume: 1 mL

## 2022-07-20 ENCOUNTER — Other Ambulatory Visit (INDEPENDENT_AMBULATORY_CARE_PROVIDER_SITE_OTHER): Payer: BC Managed Care – PPO

## 2022-07-20 ENCOUNTER — Telehealth: Payer: Self-pay | Admitting: *Deleted

## 2022-07-20 DIAGNOSIS — E559 Vitamin D deficiency, unspecified: Secondary | ICD-10-CM

## 2022-07-20 DIAGNOSIS — E785 Hyperlipidemia, unspecified: Secondary | ICD-10-CM

## 2022-07-20 DIAGNOSIS — R5383 Other fatigue: Secondary | ICD-10-CM

## 2022-07-20 DIAGNOSIS — E538 Deficiency of other specified B group vitamins: Secondary | ICD-10-CM

## 2022-07-20 DIAGNOSIS — Z125 Encounter for screening for malignant neoplasm of prostate: Secondary | ICD-10-CM

## 2022-07-20 NOTE — Telephone Encounter (Signed)
Pt came in for labs this morning at 9am. I sent a message to Dr. Volanda Napoleon at 7:47am about changing orders to her name. Another message was sent by Sharee Pimple at 11:47am. Still no response. But there was discussion between provider & CMA.   I changed the orders this afternoon so that blood can still be processed.   Pt is scheduled to see Dr. Volanda Napoleon on Thursday 2/29 for a 6 month f/u from Dr. Gayland Curry visit in August.

## 2022-07-22 LAB — CBC WITH DIFFERENTIAL/PLATELET
Basophils Absolute: 0.1 10*3/uL (ref 0.0–0.2)
Basos: 1 %
EOS (ABSOLUTE): 0.8 10*3/uL — ABNORMAL HIGH (ref 0.0–0.4)
Eos: 14 %
Hematocrit: 44.9 % (ref 37.5–51.0)
Hemoglobin: 14.6 g/dL (ref 13.0–17.7)
Immature Grans (Abs): 0 10*3/uL (ref 0.0–0.1)
Immature Granulocytes: 0 %
Lymphocytes Absolute: 1.7 10*3/uL (ref 0.7–3.1)
Lymphs: 30 %
MCH: 27.8 pg (ref 26.6–33.0)
MCHC: 32.5 g/dL (ref 31.5–35.7)
MCV: 86 fL (ref 79–97)
Monocytes Absolute: 0.6 10*3/uL (ref 0.1–0.9)
Monocytes: 11 %
Neutrophils Absolute: 2.6 10*3/uL (ref 1.4–7.0)
Neutrophils: 44 %
Platelets: 232 10*3/uL (ref 150–450)
RBC: 5.25 x10E6/uL (ref 4.14–5.80)
RDW: 14 % (ref 11.6–15.4)
WBC: 5.8 10*3/uL (ref 3.4–10.8)

## 2022-07-22 LAB — COMPREHENSIVE METABOLIC PANEL
ALT: 16 IU/L (ref 0–44)
AST: 15 IU/L (ref 0–40)
Albumin/Globulin Ratio: 1.7 (ref 1.2–2.2)
Albumin: 4.1 g/dL (ref 4.1–5.1)
Alkaline Phosphatase: 84 IU/L (ref 44–121)
BUN/Creatinine Ratio: 13 (ref 9–20)
BUN: 16 mg/dL (ref 6–24)
Bilirubin Total: 0.3 mg/dL (ref 0.0–1.2)
CO2: 21 mmol/L (ref 20–29)
Calcium: 9.4 mg/dL (ref 8.7–10.2)
Chloride: 106 mmol/L (ref 96–106)
Creatinine, Ser: 1.2 mg/dL (ref 0.76–1.27)
Globulin, Total: 2.4 g/dL (ref 1.5–4.5)
Glucose: 100 mg/dL — ABNORMAL HIGH (ref 70–99)
Potassium: 4.9 mmol/L (ref 3.5–5.2)
Sodium: 143 mmol/L (ref 134–144)
Total Protein: 6.5 g/dL (ref 6.0–8.5)
eGFR: 75 mL/min/{1.73_m2} (ref >59)

## 2022-07-22 LAB — LIPID PANEL
Chol/HDL Ratio: 4.3 ratio (ref 0.0–5.0)
Cholesterol, Total: 200 mg/dL — ABNORMAL HIGH (ref 100–199)
HDL: 46 mg/dL (ref >39)
LDL Chol Calc (NIH): 129 mg/dL — ABNORMAL HIGH (ref 0–99)
Triglycerides: 137 mg/dL (ref 0–149)
VLDL Cholesterol Cal: 25 mg/dL (ref 5–40)

## 2022-07-22 LAB — VITAMIN D 25 HYDROXY (VIT D DEFICIENCY, FRACTURES): Vit D, 25-Hydroxy: 23.5 ng/mL — ABNORMAL LOW (ref 30.0–100.0)

## 2022-07-22 LAB — PSA: Prostate Specific Ag, Serum: 0.5 ng/mL (ref 0.0–4.0)

## 2022-07-22 LAB — TSH: TSH: 1.96 u[IU]/mL (ref 0.450–4.500)

## 2022-07-22 LAB — VITAMIN B12

## 2022-07-23 ENCOUNTER — Ambulatory Visit: Payer: BC Managed Care – PPO | Admitting: Family Medicine

## 2022-07-23 ENCOUNTER — Encounter: Payer: Self-pay | Admitting: Family Medicine

## 2022-07-23 VITALS — BP 115/78 | HR 81 | Temp 98.2°F | Ht 72.0 in | Wt 278.8 lb

## 2022-07-23 DIAGNOSIS — E785 Hyperlipidemia, unspecified: Secondary | ICD-10-CM

## 2022-07-23 DIAGNOSIS — E559 Vitamin D deficiency, unspecified: Secondary | ICD-10-CM | POA: Diagnosis not present

## 2022-07-23 DIAGNOSIS — E669 Obesity, unspecified: Secondary | ICD-10-CM

## 2022-07-23 DIAGNOSIS — F39 Unspecified mood [affective] disorder: Secondary | ICD-10-CM

## 2022-07-23 NOTE — Patient Instructions (Addendum)
It was a pleasure meeting you today. Thank you for allowing me to take part in your health care.  Our goals for today as we discussed include:  Use nasal saline Humidified air   Total cholesterol was high at 200 (nml is <200) and LDL (bad) cholesterol was 129 which is also elevated (nml is <99).  You can decrease these numbers by: -limiting your intake of processed foods and foods high in saturated fats.  -increasing your physical activity -increasing your intake of vegetables, healthy fish (bluefin tuna, wild salmon, and sardines), whole grains and fiber rich foods .   -You can also use extra virgin olive oil instead of butter. -If you smoke, quitting can decrease LDL cholesterol and raise HDL cholesterol.  We can recheck your cholesterol in 6-12 months, if you continue to have elevated numbers we will discuss medication options at that time.   Take Wellbutrin regularly. Schedule appointment in 4 weeks.  For [place] [phone] Address: [place], [place] [address] Hours: Open 24/7, No appointment required.    Can check [facility] [email or web address] for therapist to help with anxiety/depression  If you have any questions or concerns, please do not hesitate to call the office at [phone].  I look forward to our next visit and until then take care and stay safe.  Regards,   Carollee Leitz, MD   [facility]

## 2022-07-23 NOTE — Progress Notes (Signed)
SUBJECTIVE:   Chief Complaint  Patient presents with   Transitions Of Care   HPI Patient presents to clinic to transfer care.  No acute concerns today.  Recent labs completed and reviewed with patient.  Mood disorder. Patient currently takes Xanax 0.25 mg 3-4 times a week at bedtime for anxiety.  Prescribed Wellbutrin XL 300 mg daily but has been inconsistent with taking medication.  Reports depressed mood and anxiety recently.  Increased stress at work.  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  Lives alone.  Has adult daughter who lives in [place].  Visits frequently.  Denies any SI/HI.  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  Decided to stay on daily therapy for prophylaxis.    ED Follows with urology.  Currently takes Cialis 10 mg as needed.  Hyperlipidemia Not currently on statin therapy.  PERTINENT PMH / PSH: Hyperlipidemia Obesity class II Status post Nissen fundoplication EGD Vitamin D deficiency Mood disorder OSA GERD   OBJECTIVE:  BP 115/78   Pulse 81   Temp 98.2 F (36.8 C) (Oral)   Ht 6' (1.829 m)   Wt 278 lb 12.8 oz (126.5 kg)   SpO2 98%   BMI 37.81 kg/m    Physical Exam Constitutional:      General: He is not in acute distress.    Appearance: He is normal weight. He is not ill-appearing.  HENT:     Head: Normocephalic.  Eyes:     Conjunctiva/sclera: Conjunctivae normal.  Cardiovascular:     Rate and Rhythm: Normal rate and regular rhythm.     Pulses: Normal pulses.  Pulmonary:     Effort: Pulmonary effort is normal.     Breath sounds: Normal breath sounds.  Abdominal:     General: Bowel sounds are normal.  Neurological:     Mental Status: He is alert. Mental status is at baseline.  Psychiatric:        Mood and Affect: Mood normal.        Behavior: Behavior normal.        Thought Content: Thought content normal.        Judgment: Judgment normal.       07/23/2022    9:04 AM  01/20/2022    9:01 AM 04/04/2021   10:08 AM 01/01/2021   10:05 AM 08/29/2020    8:25 AM  Depression screen PHQ 2/9  Decreased Interest 2 2 0 2 0  Down, Depressed, Hopeless 2 1 0 2 0  PHQ - 2 Score 4 3 0 4 0  Altered sleeping '2 3  2 '$ 0  Tired, decreased energy 2 3 0 2 0  Change in appetite 1 2 0 1 0  Feeling bad or failure about yourself  3 2 0 2 0  Trouble concentrating 2 1 0 2 0  Moving slowly or fidgety/restless 0 0 0 0 0  Suicidal thoughts 0 0 0 0 0  PHQ-9 Score '14 14  13 '$ 0  Difficult doing work/chores Somewhat difficult Somewhat difficult Not difficult at all Somewhat difficult Not difficult at all       07/23/2022    9:06 AM 01/01/2021   10:05 AM 02/29/2020   10:06 AM 11/10/2019    9:32 AM  GAD 7 : Generalized Anxiety Score  Nervous, Anxious, on Edge 3 2 0 3  Control/stop worrying 2 2 0 2  Worry too much - different things 2 2 0 3  Trouble relaxing  2 2 0 3  Restless 1 2 0 2  Easily annoyed or irritable 2 2 0 2  Afraid - awful might happen 3 2 0 3  Total GAD 7 Score 15 14 0 18  Anxiety Difficulty Somewhat difficult Somewhat difficult  Somewhat difficult      ASSESSMENT/PLAN:  Mood disorder (HCC) Assessment & Plan: Chronic.  PHQ-9 and GAD score essentially unchanged.  Denies any SI/HI.  Long-term intermittent use of Xanax for sleep.  Would like to wean Xanax in future.  Discussed with patient side effects to include increased confusion, falls, association with cognitive decline with advancing age.   Patient will consider and plan to discuss at next visit Recommend continuing Wellbutrin XL 300 mg daily.  Given inconsistent use will not add medications today.  Follow-up in 1 to 2 weeks and if no improvement plan to add [place] psychiatry referral, patient declines Would benefit from CBT     Obesity (BMI 35.0-39.9 without comorbidity) Assessment & Plan: Previously prescribed [id number].  Had not started medication.  Does not want to reinitiate GLP-1. Recommend healthy  lifestyle management   Vitamin D deficiency Assessment & Plan: Low vitamin D levels Continue vitamin D 1.25 mg weekly Will repeat 3 to 6 months.   Hyperlipidemia, unspecified hyperlipidemia type Assessment & Plan: Chronic.  Recent LDL 128.  Goal less than 100. Continue ASCVD risk 4.1%, low. No indication for statin therapy at this time. Encourage weight loss, diet and exercise. Lipids annually    HCM COVID up-to-date Tdap up-to-date Due 7/26 Colonoscopy up-to-date.  Due 03/2029 Hepatitis C/HIV screening completed  PDMP reviewed  Return in about 4 weeks (around 08/20/2022) for PCP.  Carollee Leitz, MD

## 2022-07-25 ENCOUNTER — Encounter: Payer: Self-pay | Admitting: Family Medicine

## 2022-07-25 DIAGNOSIS — F39 Unspecified mood [affective] disorder: Secondary | ICD-10-CM | POA: Insufficient documentation

## 2022-07-25 NOTE — Assessment & Plan Note (Signed)
Previously prescribed Wegovy.  Had not started medication.  Does not want to reinitiate GLP-1. Recommend healthy lifestyle management

## 2022-07-25 NOTE — Assessment & Plan Note (Addendum)
Chronic.  PHQ-9 and GAD score essentially unchanged.  Denies any SI/HI.  Long-term intermittent use of Xanax for sleep.  Would like to wean Xanax in future.  Discussed with patient side effects to include increased confusion, falls, association with cognitive decline with advancing age.   Patient will consider and plan to discuss at next visit Recommend continuing Wellbutrin XL 300 mg daily.  Given inconsistent use will not add medications today.  Follow-up in 1 to 2 weeks and if no improvement plan to add [facility] psychiatry referral, patient declines Would benefit from CBT

## 2022-07-25 NOTE — Assessment & Plan Note (Signed)
Low vitamin D levels Continue vitamin D 1.25 mg weekly Will repeat 3 to 6 months.

## 2022-07-25 NOTE — Assessment & Plan Note (Signed)
Chronic.  Recent LDL 128.  Goal less than 100. Continue ASCVD risk 4.1%, low. No indication for statin therapy at this time. Encourage weight loss, diet and exercise. Lipids annually

## 2022-07-30 ENCOUNTER — Telehealth: Payer: Self-pay

## 2022-07-30 NOTE — Telephone Encounter (Signed)
Nicole Kindred called from [facility] to state Dr. Carollee Leitz had reached out to him because she could not locate patient's name in PMP - controlled substance database.  Nicole Kindred states he was able to find patient's name in this database.  Nicole Kindred states in the PMP, the last controlled substance listed that patient received was on 05/07/2022 - alprazolam.

## 2022-08-09 ENCOUNTER — Encounter: Payer: Self-pay | Admitting: Family Medicine

## 2022-08-10 ENCOUNTER — Encounter: Payer: Self-pay | Admitting: Family Medicine

## 2022-08-10 NOTE — Telephone Encounter (Signed)
Pt returned Gae Bon CMA call, looking for an appt, we do not have any openings for tomorrow, however, I booked him with Dr. Glori Bickers @ [facility] @8 :30am, as per pt request, its virtual.

## 2022-08-11 ENCOUNTER — Encounter: Payer: Self-pay | Admitting: Family Medicine

## 2022-08-11 ENCOUNTER — Telehealth (INDEPENDENT_AMBULATORY_CARE_PROVIDER_SITE_OTHER): Payer: BC Managed Care – PPO | Admitting: Family Medicine

## 2022-08-11 VITALS — Temp 98.7°F

## 2022-08-11 DIAGNOSIS — J019 Acute sinusitis, unspecified: Secondary | ICD-10-CM | POA: Insufficient documentation

## 2022-08-11 DIAGNOSIS — J0100 Acute maxillary sinusitis, unspecified: Secondary | ICD-10-CM | POA: Diagnosis not present

## 2022-08-11 MED ORDER — PREDNISONE 20 MG PO TABS: 20.0000 mg | ORAL_TABLET | Freq: Every day | ORAL | 0 refills | Status: DC

## 2022-08-11 MED ORDER — AMOXICILLIN-POT CLAVULANATE 875-125 MG PO TABS: 1.0000 | ORAL_TABLET | Freq: Two times a day (BID) | ORAL | 0 refills | Status: DC

## 2022-08-11 NOTE — Assessment & Plan Note (Signed)
S/p viral uri  Made worse by rebound congestion after using vics nasal spray  Mostly on the L / with purulent nasal mucous Disc symptom care-see AVS Px augmentin to take bid for 7d Also prednisone 20 mg daily for 5 d for congestion  Disc ER precautions Update if not starting to improve in a week or if worsening

## 2022-08-11 NOTE — Progress Notes (Signed)
Virtual Visit via Video Note  I connected with Joshua Ramirez on 08/11/22 at  8:30 AM EDT by a video enabled telemedicine application and verified that I am speaking with the correct person using two identifiers.  Location: Patient: work/ school building Provider: office    I discussed the limitations of evaluation and management by telemedicine and the availability of in person appointments. The patient expressed understanding and agreed to proceed.  Parties involved in encounter  Patient: Joshua Ramirez   Provider:  Loura Pardon MD   History of Present Illness: 47 yo pt of Dr Volanda Napoleon presents with uri symptoms He has OSA and uses cpap   Saw Dr Volanda Napoleon a week ago- was a bit congested   Friday it got worse during the day    Cough: - not bad/ just a little from post nasal drip   Sinus and nasal congestion   Much worse at night   Facial pain - in the left cheek  Teeth hurt on that side  A lot of pressure   No fever   Green/yellow nasal mucous   Did a covid test and it was negative   Otc Tried nasal mist -off brand of vics  Tried netti pot - won't go all the way through  Patient Active Problem List   Diagnosis Date Noted   Acute sinusitis 08/11/2022   Mood disorder ([place]) 07/25/2022   Primary osteoarthritis of right knee 04/21/2021   Depression, recurrent ([place]) 11/10/2019   Erectile dysfunction 02/08/2019   Vitamin D deficiency 08/23/2018   S/P Nissen fundoplication (without gastrostomy tube) procedure 03/03/2018   Right knee pain 12/23/2017   OSA on CPAP 09/06/2017   Chronic pain of right knee [id number]   Umbilical hernia without obstruction and without gangrene 06/20/2017   Hyperlipidemia 06/20/2017   Obesity (BMI 35.0-39.9 without comorbidity) 06/20/2017   Past Medical History:  Diagnosis Date   Annual physical exam 03/17/2019   Anxiety and depression 06/24/2018   Arthritis    knee s/p right knee surgery meniscal tear with bone fragments, ankle (ortho Dr. Rudene Christians)    COVID-19    11/2020   GERD (gastroesophageal reflux disease)    GERD (gastroesophageal reflux disease) 09/06/2017   H. pylori infection    Herpes infection    OSA on CPAP    Sleep apnea 06/2017   PT JUST WAS DX WITH SLEEP APNEA LAST WEEK AND IS WAITING ON HIS CPAP MACHINE   Testicular calcification 02/08/2019   Past Surgical History:  Procedure Laterality Date   COLONOSCOPY WITH PROPOFOL N/A 04/03/2019   Procedure: COLONOSCOPY WITH PROPOFOL;  Surgeon: [name], MD;  Location: [facility] ENDOSCOPY;  Service: Gastroenterology;  Laterality: N/A;   CYST EXCISION  12/12/2018   Procedure: CYST REMOVAL;  Surgeon: Leim Fabry, MD;  Location: [facility] ORS;  Service: Orthopedics;;   ESOPHAGOGASTRODUODENOSCOPY (EGD) WITH PROPOFOL N/A 01/26/2018   Procedure: ESOPHAGOGASTRODUODENOSCOPY (EGD) WITH PROPOFOL;  Surgeon: [name], MD;  Location: [facility] ENDOSCOPY;  Service: Gastroenterology;  Laterality: N/A;   KIDNEY DONATION Left    2004   KNEE ARTHROSCOPY WITH MEDIAL MENISECTOMY Right 07/15/2017   Procedure: KNEE ARTHROSCOPY WITH MEDIAL MENISECTOMY;  Surgeon: Hessie Knows, MD;  Location: [facility] ORS;  Service: Orthopedics;  Laterality: Right;   KNEE ARTHROSCOPY WITH MEDIAL MENISECTOMY Right 12/12/2018   Procedure: KNEE ARTHROSCOPY WITH PARTIAL MEDIAL MENISECTOMY;  Surgeon: Leim Fabry, MD;  Location: [facility] ORS;  Service: Orthopedics;  Laterality: Right;   meniscal tear     right knee Dr. Rudene Christians 2014  MENISECTOMY Right 07/15/2017   Procedure: MENISECTOMY;  Surgeon: Hessie Knows, MD;  Location: [facility] ORS;  Service: Orthopedics;  Laterality: Right;   ROBOTIC ASSISTED LAPAROSCOPIC NISSEN FUNDOPLICATION N/A 0000000   Procedure: ROBOTIC ASSISTED LAPAROSCOPIC NISSEN FUNDOPLICATION WITH HIATAL HERNIA REPAIR;  Surgeon: Jules Husbands, MD;  Location: [facility] ORS;  Service: General;  Laterality: N/A;   UMBILICAL HERNIA REPAIR N/A 03/03/2018   Procedure: HERNIA REPAIR UMBILICAL ADULT;  Surgeon: Jules Husbands, MD;   Location: [facility] ORS;  Service: General;  Laterality: N/A;   VASECTOMY     02/04/22   Social History   Tobacco Use   Smoking status: Never    Passive exposure: Never   Smokeless tobacco: Never   Tobacco comments:    light in the past social smoker quit  Vaping Use   Vaping Use: Never used  Substance Use Topics   Alcohol use: Yes    Alcohol/week: 1.0 - 2.0 standard drink of alcohol    Types: 1 - 2 Shots of liquor per week   Drug use: No   Family History  Problem Relation Age of Onset   Drug abuse Mother    Hyperlipidemia Mother    Hypertension Mother    Cancer Maternal Aunt        brain, breast, lung ? primary was smoker    Prostate cancer Neg Hx    Bladder Cancer Neg Hx    Kidney cancer Neg Hx    No Known Allergies Current Outpatient Medications on File Prior to Visit  Medication Sig Dispense Refill   ALPRAZolam (XANAX) 0.25 MG tablet Take 1 tablet (0.25 mg total) by mouth at bedtime as needed for anxiety. 30 tablet 5   buPROPion (WELLBUTRIN XL) 300 MG 24 hr tablet Take 1 tablet (300 mg total) by mouth daily. 90 tablet 3   Cholecalciferol 1.25 MG (50000 UT) capsule Take 1 capsule (50,000 Units total) by mouth once a week. D3 13 capsule 1   Olopatadine HCl 0.2 % SOLN Place 1 drop into both eyes daily as needed (runny eyes). 2.5 mL 11   tadalafil (CIALIS) 10 MG tablet Take 1 tablet (10 mg total) by mouth daily as needed for erectile dysfunction. 30 tablet 11   valACYclovir (VALTREX) 500 MG tablet Take 1 tablet (500 mg total) by mouth See admin instructions. Take 500 mg daily for prevention. With outbreak take 500 mg twice daily for 3 to 7 days 120 tablet 3   No current facility-administered medications on file prior to visit.   Review of Systems  Constitutional:  Negative for chills, fever and malaise/fatigue.  HENT:  Positive for congestion and sinus pain. Negative for ear pain and sore throat.   Eyes:  Negative for blurred vision, discharge and redness.  Respiratory:   Positive for cough. Negative for sputum production, shortness of breath, wheezing and stridor.   Cardiovascular:  Negative for chest pain, palpitations and leg swelling.  Gastrointestinal:  Negative for abdominal pain, diarrhea, nausea and vomiting.  Musculoskeletal:  Negative for myalgias.  Skin:  Negative for rash.  Neurological:  Positive for headaches. Negative for dizziness.      Observations/Objective: Patient appears well, in no distress Weight is baseline  No facial swelling or asymmetry Normal voice-not hoarse and no slurred speech (does sound nasally congested) No obvious tremor or mobility impairment Moving neck and UEs normally Able to hear the call well  No cough or shortness of breath during interview  Talkative and mentally sharp with no cognitive changes  No skin changes on face or neck , no rash or pallor Affect is normal    Assessment and Plan: Problem List Items Addressed This Visit       Respiratory   Acute sinusitis - Primary    S/p viral uri  Made worse by rebound congestion after using vics nasal spray  Mostly on the L / with purulent nasal mucous Disc symptom care-see AVS Px augmentin to take bid for 7d Also prednisone 20 mg daily for 5 d for congestion  Disc ER precautions Update if not starting to improve in a week or if worsening         Relevant Medications   amoxicillin-clavulanate (AUGMENTIN) 875-125 MG tablet   predniSONE (DELTASONE) 20 MG tablet     Follow Up Instructions: Stop the vics type nasal spray  Nasal saline and netti pot are ok  Drink fluids  Breathe steam when you can for congestion   Take augmentin as directed (antibiotic) Take prednisone as directed (steroid)- this may make you feel hyper and hungry   If symptoms suddenly worsen or you have more facial swelling/pain or any fever please let us know and go to ER if needed   Update if not starting to improve in a week or if worsening     I discussed the assessment and  treatment plan with the patient. The patient was provided an opportunity to ask questions and all were answered. The patient agreed with the plan and demonstrated an understanding of the instructions.   The patient was advised to call back or seek an in-person evaluation if the symptoms worsen or if the condition fails to improve as anticipated.     Loura Pardon, MD

## 2022-08-11 NOTE — Patient Instructions (Signed)
Stop the vics type nasal spray  Nasal saline and netti pot are ok  Drink fluids  Breathe steam when you can for congestion   Take augmentin as directed (antibiotic) Take prednisone as directed (steroid)- this may make you feel hyper and hungry   If symptoms suddenly worsen or you have more facial swelling/pain or any fever please let us know and go to ER if needed   Update if not starting to improve in a week or if worsening

## 2022-09-01 ENCOUNTER — Ambulatory Visit: Payer: BC Managed Care – PPO | Admitting: Family Medicine

## 2022-09-23 ENCOUNTER — Ambulatory Visit: Payer: BC Managed Care – PPO | Admitting: Family Medicine

## 2022-09-24 ENCOUNTER — Telehealth: Payer: Self-pay | Admitting: *Deleted

## 2022-09-24 DIAGNOSIS — B009 Herpesviral infection, unspecified: Secondary | ICD-10-CM

## 2022-09-24 DIAGNOSIS — F32A Depression, unspecified: Secondary | ICD-10-CM

## 2022-09-24 MED ORDER — VALACYCLOVIR HCL 500 MG PO TABS: 500.0000 mg | ORAL_TABLET | ORAL | 3 refills | Status: DC

## 2022-09-24 MED ORDER — ALPRAZOLAM 0.25 MG PO TABS: 0.2500 mg | ORAL_TABLET | Freq: Every evening | ORAL | 0 refills | Status: DC | PRN

## 2022-09-24 NOTE — Telephone Encounter (Signed)
LOV: 07/23/22  NOV: Not scheduled-appt on 09/23/22 had to be cancelled.

## 2022-09-25 ENCOUNTER — Telehealth: Payer: Self-pay

## 2022-09-25 NOTE — Telephone Encounter (Signed)
Patient cancelled his appointment with Dr. Dana Allan on 09/23/2022.  Patient needs appt for med refill & to sign a non-opiod contract.  I called patient but was unable to leave a voicemail.  I sent a text message to patient asking him to please call our office regarding his medication.

## 2022-09-25 NOTE — Telephone Encounter (Signed)
Tried to call pt as well did not answer and could not leave a VM

## 2022-09-25 NOTE — Telephone Encounter (Addendum)
Pt need appt for med refill & to sign a non-opiod contract

## 2023-01-26 ENCOUNTER — Ambulatory Visit: Payer: BC Managed Care – PPO | Admitting: Family Medicine

## 2023-02-10 ENCOUNTER — Ambulatory Visit: Payer: BC Managed Care – PPO | Admitting: Urology

## 2023-02-17 ENCOUNTER — Ambulatory Visit: Payer: BC Managed Care – PPO | Admitting: Urology

## 2023-02-25 ENCOUNTER — Other Ambulatory Visit: Payer: Self-pay | Admitting: Family Medicine

## 2023-02-25 DIAGNOSIS — F32A Depression, unspecified: Secondary | ICD-10-CM

## 2023-02-25 NOTE — Telephone Encounter (Signed)
Last filled 09/24/2022 #[id number]  LOV 07/23/22 NOV per Dr. Clent Ridges return in 4 weeks 08/20/2022 pt does not have an appointment scheduled.

## 2023-03-25 ENCOUNTER — Ambulatory Visit: Payer: BC Managed Care – PPO | Admitting: Family Medicine

## 2023-03-25 ENCOUNTER — Encounter: Payer: Self-pay | Admitting: Family Medicine

## 2023-03-25 VITALS — BP 124/82 | HR 87 | Temp 98.2°F | Resp 16 | Ht 72.0 in | Wt 277.4 lb

## 2023-03-25 DIAGNOSIS — R0982 Postnasal drip: Secondary | ICD-10-CM | POA: Diagnosis not present

## 2023-03-25 DIAGNOSIS — G479 Sleep disorder, unspecified: Secondary | ICD-10-CM

## 2023-03-25 DIAGNOSIS — F39 Unspecified mood [affective] disorder: Secondary | ICD-10-CM

## 2023-03-25 DIAGNOSIS — B009 Herpesviral infection, unspecified: Secondary | ICD-10-CM

## 2023-03-25 DIAGNOSIS — Z23 Encounter for immunization: Secondary | ICD-10-CM | POA: Diagnosis not present

## 2023-03-25 LAB — COMPREHENSIVE METABOLIC PANEL
ALT: 22 U/L (ref 0–53)
AST: 20 U/L (ref 0–37)
Albumin: 4 g/dL (ref 3.5–5.2)
Alkaline Phosphatase: 70 U/L (ref 39–117)
BUN: 17 mg/dL (ref 6–23)
CO2: 27 meq/L (ref 19–32)
Calcium: 9.6 mg/dL (ref 8.4–10.5)
Chloride: 104 meq/L (ref 96–112)
Creatinine, Ser: 1.37 mg/dL (ref 0.40–1.50)
GFR: 60.79 mL/min (ref >60.00)
Glucose, Bld: 95 mg/dL (ref 70–99)
Potassium: 4.4 meq/L (ref 3.5–5.1)
Sodium: 139 meq/L (ref 135–145)
Total Bilirubin: 0.5 mg/dL (ref 0.2–1.2)
Total Protein: 7.2 g/dL (ref 6.0–8.3)

## 2023-03-25 MED ORDER — VALACYCLOVIR HCL 500 MG PO TABS: 500.0000 mg | ORAL_TABLET | ORAL | 3 refills | Status: AC

## 2023-03-25 MED ORDER — FLUTICASONE PROPIONATE 50 MCG/ACT NA SUSP
2.0000 | Freq: Every day | NASAL | 6 refills | Status: AC
Start: 2023-03-25 — End: ?

## 2023-03-25 MED ORDER — ESCITALOPRAM OXALATE 5 MG PO TABS
5.0000 mg | ORAL_TABLET | Freq: Every day | ORAL | 0 refills | Status: DC
Start: 2023-03-25 — End: 2023-04-16

## 2023-03-25 MED ORDER — ALPRAZOLAM 0.25 MG PO TABS: 0.2500 mg | ORAL_TABLET | Freq: Every evening | ORAL | 0 refills | Status: AC | PRN

## 2023-03-25 NOTE — Progress Notes (Signed)
SUBJECTIVE:   Chief Complaint  Patient presents with   Anxiety   HPI Presents to clinic for follow up mood disorder  Discussed the use of AI scribe software for clinical note transcription with the patient, who gave verbal consent to proceed.  History of Present Illness The patient, with a history of depression and anxiety, presents for a follow-up visit after a period of non-compliance with their prescribed Wellbutrin regimen. They report a lapse in medication adherence due to a combination of system issues and personal circumstances, including the recent passing of their grandfather. The patient has been off their medication for approximately three weeks. During the period of consistent Wellbutrin use, they noticed a slight improvement in their mood.  The patient also reports increased anxiety and sleep disturbances, characterized by frequent awakenings and difficulty returning to sleep. They have been without their prescribed Xanax, which was previously used sparingly for sleep issues.  In addition to their mental health concerns, the patient mentions occasional congestion, particularly at night and in the morning, which they suspect may be related to allergies from their roommate's pets. They also report a history of a stomach surgery that has resulted in an inability to burp or vomit, and occasional feelings of gagging while brushing their teeth.  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 They also mention the recent diagnosis of breast cancer in their mother, which has further contributed to their anxiety.    PERTINENT PMH / PSH: As above  OBJECTIVE:  BP 124/82   Pulse 87   Temp 98.2 F (36.8 C)   Resp 16   Ht 6' (1.829 m)   Wt 277 lb 6 oz (125.8 kg)   SpO2 96%   BMI 37.62 kg/m    Physical Exam Vitals reviewed.  Constitutional:      General: He is not in acute  distress.    Appearance: Normal appearance. He is obese. He is not ill-appearing, toxic-appearing or diaphoretic.  Eyes:     General:        Right eye: No discharge.        Left eye: No discharge.  Cardiovascular:     Rate and Rhythm: Normal rate and regular rhythm.     Heart sounds: Normal heart sounds.  Pulmonary:     Effort: Pulmonary effort is normal.     Breath sounds: Normal breath sounds.  Abdominal:     General: Bowel sounds are normal.  Musculoskeletal:        General: Normal range of motion.     Cervical back: Normal range of motion.  Skin:    General: Skin is warm and dry.  Neurological:     Mental Status: He is alert and oriented to person, place, and time. Mental status is at baseline.  Psychiatric:        Mood and Affect: Mood normal.        Behavior: Behavior normal.        Thought Content: Thought content normal.        Judgment: Judgment normal.        03/25/2023    9:22 AM 07/23/2022    9:04 AM 01/20/2022    9:01 AM 04/04/2021   10:08 AM 01/01/2021   10:05 AM  Depression screen PHQ 2/9  Decreased Interest 3 2 2  0 2  Down, Depressed, Hopeless 3 2 1  0 2  PHQ -  2 Score 6 4 3  0 4  Altered sleeping 3 2 3  2   Tired, decreased energy 3 2 3  0 2  Change in appetite 2 1 2  0 1  Feeling bad or failure about yourself  3 3 2  0 2  Trouble concentrating 1 2 1  0 2  Moving slowly or fidgety/restless 1 0 0 0 0  Suicidal thoughts 0 0 0 0 0  PHQ-9 Score 19 14 14  13   Difficult doing work/chores Very difficult Somewhat difficult Somewhat difficult Not difficult at all Somewhat difficult      03/25/2023    9:22 AM 07/23/2022    9:06 AM 01/01/2021   10:05 AM 02/29/2020   10:06 AM  GAD 7 : Generalized Anxiety Score  Nervous, Anxious, on Edge 2 3 2  0  Control/stop worrying 2 2 2  0  Worry too much - different things 2 2 2  0  Trouble relaxing 2 2 2  0  Restless 2 1 2  0  Easily annoyed or irritable 3 2 2  0  Afraid - awful might happen 3 3 2  0  Total GAD 7 Score 16 15 14   0  Anxiety Difficulty Very difficult Somewhat difficult Somewhat difficult     ASSESSMENT/PLAN:  Mood disorder (HCC) Assessment & Plan: Off Wellbutrin for 3 weeks due to refill issues and personal circumstances. Noted some improvement when taking Wellbutrin consistently. Discussed options of restarting Wellbutrin or starting an SSRI like Lexapro. Patient expressed interest in Lexapro based on anecdotal evidence from acquaintances. -Discontinue Wellbutrin. -Start Lexapro 5mg  daily. -Follow up in 1 week to assess for side effects and in 2 weeks to assess efficacy.  Orders: -     ALPRAZolam; Take 1 tablet (0.25 mg total) by mouth at bedtime as needed for anxiety.  Dispense: 15 tablet; Refill: 0 -     Comprehensive metabolic panel -     Escitalopram Oxalate; Take 1 tablet (5 mg total) by mouth daily.  Dispense: 30 tablet; Refill: 0 -     ToxASSURE Select 13 (MW), Urine  Herpes infection Assessment & Plan: History of flares Refill Valtrex  Orders: -     valACYclovir HCl; Take 1 tablet (500 mg total) by mouth See admin instructions. Take 500 mg daily for prevention. With outbreak take 500 mg twice daily for 3 to 7 days  Dispense: 120 tablet; Refill: 3  Post-nasal drip Assessment & Plan: Reports morning congestion and phlegm in the back of the throat. Possible allergies to roommate's pets. -Trial of Flonase nasal spray.  Orders: -     Fluticasone Propionate; Place 2 sprays into both nostrils daily.  Dispense: 16 g; Refill: 6  Need for influenza vaccination -     Flu vaccine trivalent PF, 6mos and older(Flulaval,Afluria,Fluarix,Fluzone)  Sleep disorder Assessment & Plan: Reports difficulty sleeping, waking up in the middle of the night and not being able to fall back asleep. -Continue current use of Xanax as needed for sleep. -Refill Xanax 0.25 mg at night as needed, 14 tabs -UDS and non opioid contract signed. -Reevaluate need for Xanax after Lexapro has had time to take  effect.    General Health Maintenance -Schedule annual physical exam. -Check kidney function today due to history of single kidney. -Continue current use of Cialis as prescribed by urologist.    PDMP reviewed  Return in about 2 weeks (around 04/08/2023) for PCP.  Dana Allan, MD

## 2023-03-25 NOTE — Patient Instructions (Addendum)
It was a pleasure meeting you today. Thank you for allowing me to take part in your health care.  Our goals for today as we discussed include:  We will get some labs today.  If they are abnormal or we need to do something about them, I will call you.  If they are normal, I will send you a message on MyChart (if it is active) or a letter in the mail.  If you don't hear from [place] in 2 weeks, please call the office at the number below.   Start Lexapro 5 mg daily Use Xanax at night for anxiety sparingly  Benzodiazepine use is associated with an increased risk of falls, hip fractures, and mobility problems in older adults. In addition it may be associated with an increased risk of disability in mobility and activities of daily living in this population.  Other side effects include: Decreased libido, weight gain, weight loss,increased/decreased appetite,constipation,cognitive dysfunction,memory impairment, irritability  Sited from [employer] and Up To Date   Send MyChart message in 1 week to notify of any symptoms from medication  Follow up in 2 weeks.  Can be virtual or office visit  Start Flonase 2 sprays at night Netty pot as needed  Received Flu vaccine today   If you have any questions or concerns, please do not hesitate to call the office at [phone].  I look forward to our next visit and until then take care and stay safe.  Regards,   Dana Allan, MD   [facility]

## 2023-03-26 ENCOUNTER — Encounter: Payer: Self-pay | Admitting: Family Medicine

## 2023-03-28 ENCOUNTER — Encounter: Payer: Self-pay | Admitting: Family Medicine

## 2023-03-28 DIAGNOSIS — R0982 Postnasal drip: Secondary | ICD-10-CM | POA: Insufficient documentation

## 2023-03-28 DIAGNOSIS — G479 Sleep disorder, unspecified: Secondary | ICD-10-CM | POA: Insufficient documentation

## 2023-03-28 DIAGNOSIS — Z23 Encounter for immunization: Secondary | ICD-10-CM | POA: Insufficient documentation

## 2023-03-28 DIAGNOSIS — B009 Herpesviral infection, unspecified: Secondary | ICD-10-CM | POA: Insufficient documentation

## 2023-03-28 NOTE — Assessment & Plan Note (Signed)
Reports morning congestion and phlegm in the back of the throat. Possible allergies to roommate's pets. -Trial of Flonase nasal spray.

## 2023-03-28 NOTE — Assessment & Plan Note (Signed)
History of flares Refill Valtrex

## 2023-03-28 NOTE — Assessment & Plan Note (Signed)
Off Wellbutrin for 3 weeks due to refill issues and personal circumstances. Noted some improvement when taking Wellbutrin consistently. Discussed options of restarting Wellbutrin or starting an SSRI like Lexapro. Patient expressed interest in Lexapro based on anecdotal evidence from acquaintances. -Discontinue Wellbutrin. -Start Lexapro 5mg  daily. -Follow up in 1 week to assess for side effects and in 2 weeks to assess efficacy.

## 2023-03-28 NOTE — Assessment & Plan Note (Addendum)
Reports difficulty sleeping, waking up in the middle of the night and not being able to fall back asleep. -Continue current use of Xanax as needed for sleep. -Refill Xanax 0.25 mg at night as needed, 14 tabs -UDS and non opioid contract signed. -Reevaluate need for Xanax after Lexapro has had time to take effect.

## 2023-03-30 LAB — TOXASSURE SELECT 13 (MW), URINE

## 2023-04-08 ENCOUNTER — Encounter: Payer: Self-pay | Admitting: Family Medicine

## 2023-04-08 ENCOUNTER — Telehealth (INDEPENDENT_AMBULATORY_CARE_PROVIDER_SITE_OTHER): Payer: BC Managed Care – PPO | Admitting: Family Medicine

## 2023-04-08 ENCOUNTER — Telehealth: Payer: Self-pay

## 2023-04-08 VITALS — Ht 72.0 in | Wt 277.4 lb

## 2023-04-08 DIAGNOSIS — F39 Unspecified mood [affective] disorder: Secondary | ICD-10-CM | POA: Diagnosis not present

## 2023-04-08 NOTE — Telephone Encounter (Signed)
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  I transferred call to [facility].

## 2023-04-08 NOTE — Telephone Encounter (Signed)
1st attempt to get worked up for virtual visit.

## 2023-04-08 NOTE — Telephone Encounter (Signed)
Spoke to pt and work pt up for virtual visit.

## 2023-04-08 NOTE — Progress Notes (Signed)
Virtual Visit via Video note  I connected with Joshua Ramirez on 04/16/23 at 1142 by video and verified that I am speaking with the correct person using two identifiers. Joshua Ramirez is currently located at mall and  is currently with alone during visit. The provider, Dana Allan, MD is located in their office at time of visit.  I discussed the limitations, risks, security and privacy concerns of performing an evaluation and management service by video and the availability of in person appointments. I also discussed with the patient that there may be a patient responsible charge related to this service. The patient expressed understanding and agreed to proceed.  Subjective: PCP: Dana Allan, MD  Chief Complaint  Patient presents with   mood disorder    HPI Discussed the use of AI scribe software for clinical note transcription with the patient, who gave verbal consent to proceed.  History of Present Illness The patient, recently started on Lexapro, reports a subjective improvement in symptoms of depression and anxiety. However, they note persistent sleep disturbances, waking up routinely between 2:00-2:45 AM and struggling to return to sleep until 3:30-4:00 AM. They have used Xanax once for sleep but are hesitant to use it regularly due to concerns about morning drowsiness affecting their work. Despite these ongoing sleep issues, the patient expresses a desire to maintain the current dosage of Lexapro, with a plan to reassess in a few weeks.   ROS: Per HPI  Current Outpatient Medications:    ALPRAZolam (XANAX) 0.25 MG tablet, Take 1 tablet (0.25 mg total) by mouth at bedtime as needed for anxiety., Disp: 15 tablet, Rfl: 0   fluticasone (FLONASE) 50 MCG/ACT nasal spray, Place 2 sprays into both nostrils daily., Disp: 16 g, Rfl: 6   Olopatadine HCl 0.2 % SOLN, Place 1 drop into both eyes daily as needed (runny eyes)., Disp: 2.5 mL, Rfl: 11   tadalafil (CIALIS) 10 MG tablet, Take 1 tablet  (10 mg total) by mouth daily as needed for erectile dysfunction., Disp: 30 tablet, Rfl: 11   valACYclovir (VALTREX) 500 MG tablet, Take 1 tablet (500 mg total) by mouth See admin instructions. Take 500 mg daily for prevention. With outbreak take 500 mg twice daily for 3 to 7 days, Disp: 120 tablet, Rfl: 3   escitalopram (LEXAPRO) 5 MG tablet, Take 1 tablet (5 mg total) by mouth daily., Disp: 90 tablet, Rfl: 1  Observations/Objective: Physical Exam Vitals reviewed.  Constitutional:      General: He is not in acute distress.    Appearance: Normal appearance. He is not toxic-appearing.  Eyes:     Conjunctiva/sclera: Conjunctivae normal.  Pulmonary:     Effort: Pulmonary effort is normal.  Neurological:     Mental Status: He is alert. Mental status is at baseline.  Psychiatric:        Mood and Affect: Mood normal.        Behavior: Behavior normal.        Thought Content: Thought content normal.        Judgment: Judgment normal.        04/08/2023   11:32 AM 03/25/2023    9:22 AM 07/23/2022    9:04 AM 01/20/2022    9:01 AM 04/04/2021   10:08 AM  Depression screen PHQ 2/9  Decreased Interest 1 3 2 2  0  Down, Depressed, Hopeless 1 3 2 1  0  PHQ - 2 Score 2 6 4 3  0  Altered sleeping 3 3 2  3  Tired, decreased energy 2 3 2 3  0  Change in appetite 2 2 1 2  0  Feeling bad or failure about yourself  1 3 3 2  0  Trouble concentrating 1 1 2 1  0  Moving slowly or fidgety/restless 0 1 0 0 0  Suicidal thoughts 0 0 0 0 0  PHQ-9 Score 11 19 14 14    Difficult doing work/chores Somewhat difficult Very difficult Somewhat difficult Somewhat difficult Not difficult at all        04/08/2023   11:34 AM 03/25/2023    9:22 AM 07/23/2022    9:06 AM 01/01/2021   10:05 AM  GAD 7 : Generalized Anxiety Score  Nervous, Anxious, on Edge 2 2 3 2   Control/stop worrying 2 2 2 2   Worry too much - different things 2 2 2 2   Trouble relaxing 2 2 2 2   Restless 1 2 1 2   Easily annoyed or irritable 1 3 2 2    Afraid - awful might happen 1 3 3 2   Total GAD 7 Score 11 16 15 14   Anxiety Difficulty Somewhat difficult Very difficult Somewhat difficult Somewhat difficult     Assessment and Plan: Mood disorder (HCC) Assessment & Plan: Improvement noted with Lexapro 5mg  daily and intermittent Xanax use. Patient still experiencing sleep disturbances. Discussed the option of increasing Lexapro to 10mg  daily, but patient prefers to continue current dose for a few more weeks. -Continue Lexapro 5mg  daily. -Check in with patient in 1.5 weeks to assess efficacy and determine if dose increase to 10mg  daily is warranted. -Follow-up appointment in 3 months or sooner if needed.   Orders: -     Escitalopram Oxalate; Take 1 tablet (5 mg total) by mouth daily.  Dispense: 90 tablet; Refill: 1   Follow Up Instructions: Return in about 3 months (around 07/09/2023) for PCP, mood disorder.   I discussed the assessment and treatment plan with the patient. The patient was provided an opportunity to ask questions and all were answered. The patient agreed with the plan and demonstrated an understanding of the instructions.   The patient was advised to call back or seek an in-person evaluation if the symptoms worsen or if the condition fails to improve as anticipated.  The above assessment and management plan was discussed with the patient. The patient verbalized understanding of and has agreed to the management plan. Patient is aware to call the clinic if symptoms persist or worsen. Patient is aware when to return to the clinic for a follow-up visit. Patient educated on when it is appropriate to go to the emergency department.   Dana Allan, MD

## 2023-04-16 ENCOUNTER — Encounter: Payer: Self-pay | Admitting: Family Medicine

## 2023-04-16 MED ORDER — ESCITALOPRAM OXALATE 5 MG PO TABS: 5.0000 mg | ORAL_TABLET | Freq: Every day | ORAL | 1 refills | Status: AC

## 2023-04-16 NOTE — Assessment & Plan Note (Signed)
Improvement noted with Lexapro 5mg  daily and intermittent Xanax use. Patient still experiencing sleep disturbances. Discussed the option of increasing Lexapro to 10mg  daily, but patient prefers to continue current dose for a few more weeks. -Continue Lexapro 5mg  daily. -Check in with patient in 1.5 weeks to assess efficacy and determine if dose increase to 10mg  daily is warranted. -Follow-up appointment in 3 months or sooner if needed.

## 2023-04-16 NOTE — Patient Instructions (Signed)
It was a pleasure meeting you today. Thank you for allowing me to take part in your health care.  Our goals for today as we discussed include:  Continue Lexpro 5 mg daily  Follow up in 3 months   This is a list of the screening recommended for you and due dates:  Health Maintenance  Topic Date Due   COVID-19 Vaccine (4 - 2023-24 season) 01/24/2023   DTaP/Tdap/Td vaccine (3 - Td or Tdap) 12/03/2024   Colon Cancer Screening  04/02/2029   Flu Shot  Completed   Hepatitis C Screening  Completed   HIV Screening  Completed   HPV Vaccine  Aged Out      If you have any questions or concerns, please do not hesitate to call the office at [phone].  I look forward to our next visit and until then take care and stay safe.  Regards,   Dana Allan, MD   [facility]

## 2023-05-13 ENCOUNTER — Encounter: Payer: Self-pay | Admitting: Family Medicine
